# Patient Record
Sex: Male | Born: 1965 | Race: White | Hispanic: No | Marital: Single | Smoking: Former smoker
Health system: Southern US, Community
[De-identification: ages and names within clinical notes are randomized; demographics above are authoritative.]

## PROBLEM LIST (undated history)

## (undated) DIAGNOSIS — Z95810 Presence of automatic (implantable) cardiac defibrillator: Secondary | ICD-10-CM

## (undated) DIAGNOSIS — I509 Heart failure, unspecified: Secondary | ICD-10-CM

## (undated) DIAGNOSIS — Z8619 Personal history of other infectious and parasitic diseases: Secondary | ICD-10-CM

## (undated) DIAGNOSIS — R399 Unspecified symptoms and signs involving the genitourinary system: Secondary | ICD-10-CM

## (undated) DIAGNOSIS — R7303 Prediabetes: Secondary | ICD-10-CM

## (undated) DIAGNOSIS — K429 Umbilical hernia without obstruction or gangrene: Secondary | ICD-10-CM

## (undated) DIAGNOSIS — K759 Inflammatory liver disease, unspecified: Secondary | ICD-10-CM

## (undated) DIAGNOSIS — N529 Male erectile dysfunction, unspecified: Secondary | ICD-10-CM

## (undated) DIAGNOSIS — I517 Cardiomegaly: Secondary | ICD-10-CM

## (undated) DIAGNOSIS — M51369 Other intervertebral disc degeneration, lumbar region without mention of lumbar back pain or lower extremity pain: Secondary | ICD-10-CM

## (undated) DIAGNOSIS — C61 Malignant neoplasm of prostate: Secondary | ICD-10-CM

## (undated) DIAGNOSIS — K4090 Unilateral inguinal hernia, without obstruction or gangrene, not specified as recurrent: Secondary | ICD-10-CM

## (undated) DIAGNOSIS — M1990 Unspecified osteoarthritis, unspecified site: Secondary | ICD-10-CM

## (undated) DIAGNOSIS — K219 Gastro-esophageal reflux disease without esophagitis: Secondary | ICD-10-CM

## (undated) DIAGNOSIS — I428 Other cardiomyopathies: Secondary | ICD-10-CM

## (undated) DIAGNOSIS — F1911 Other psychoactive substance abuse, in remission: Secondary | ICD-10-CM

## (undated) DIAGNOSIS — I10 Essential (primary) hypertension: Secondary | ICD-10-CM

## (undated) DIAGNOSIS — E785 Hyperlipidemia, unspecified: Secondary | ICD-10-CM

## (undated) DIAGNOSIS — K649 Unspecified hemorrhoids: Secondary | ICD-10-CM

## (undated) DIAGNOSIS — K4020 Bilateral inguinal hernia, without obstruction or gangrene, not specified as recurrent: Secondary | ICD-10-CM

## (undated) DIAGNOSIS — I447 Left bundle-branch block, unspecified: Secondary | ICD-10-CM

## (undated) DIAGNOSIS — Q540 Hypospadias, balanic: Secondary | ICD-10-CM

## (undated) DIAGNOSIS — F1910 Other psychoactive substance abuse, uncomplicated: Secondary | ICD-10-CM

## (undated) DIAGNOSIS — J439 Emphysema, unspecified: Secondary | ICD-10-CM

## (undated) DIAGNOSIS — M5136 Other intervertebral disc degeneration, lumbar region: Secondary | ICD-10-CM

## (undated) DIAGNOSIS — Z950 Presence of cardiac pacemaker: Secondary | ICD-10-CM

## (undated) DIAGNOSIS — Z79899 Other long term (current) drug therapy: Secondary | ICD-10-CM

## (undated) HISTORY — DX: Other psychoactive substance abuse, uncomplicated: F19.10

## (undated) HISTORY — PX: HEMORRHOID SURGERY: SHX153

## (undated) HISTORY — DX: Heart failure, unspecified: I50.9

## (undated) HISTORY — PX: BIOPSY PROSTATE: PRO28

## (undated) HISTORY — DX: Emphysema, unspecified: J43.9

## (undated) HISTORY — PX: HYPOSPADIAS CORRECTION: SHX483

## (undated) HISTORY — PX: COLONOSCOPY W/ POLYPECTOMY: SHX1380

## (undated) HISTORY — PX: HERNIA REPAIR: SHX51

---

## 2019-11-14 ENCOUNTER — Ambulatory Visit: Payer: Self-pay

## 2019-11-25 ENCOUNTER — Ambulatory Visit: Payer: Self-pay

## 2019-11-25 ENCOUNTER — Ambulatory Visit: Payer: Self-pay | Attending: Internal Medicine

## 2019-11-25 DIAGNOSIS — Z23 Encounter for immunization: Secondary | ICD-10-CM | POA: Insufficient documentation

## 2019-11-25 NOTE — Progress Notes (Signed)
   Covid-19 Vaccination Clinic  Name:  Braelynn Scacco    MRN: [id number] DOB: Jul 18, 1966  11/25/2019  Mr. Heckman was observed post Covid-19 immunization for 15 minutes without incidence. He was provided with Vaccine Information Sheet and instruction to access the V-Safe system.   Mr. Saxon was instructed to call 911 with any severe reactions post vaccine: Marland Kitchen Difficulty breathing  . Swelling of your face and throat  . A fast heartbeat  . A bad rash all over your body  . Dizziness and weakness    Immunizations Administered    Name Date Dose VIS Date Route   Pfizer COVID-19 Vaccine 11/25/2019  9:12 AM 0.3 mL 09/24/2019 Intramuscular   Manufacturer: [place]   Lot: [id number]   [place]: [id number]

## 2019-12-18 ENCOUNTER — Ambulatory Visit: Payer: Self-pay | Attending: Internal Medicine

## 2019-12-18 DIAGNOSIS — Z23 Encounter for immunization: Secondary | ICD-10-CM

## 2019-12-18 NOTE — Progress Notes (Signed)
   Covid-19 Vaccination Clinic  Name:  Ruben Duffy    MRN: [id number] DOB: [birth year]  12/18/2019  Mr. [name] was observed post Covid-19 immunization for 15 minutes without incident. He was provided with Vaccine Information Sheet and instruction to access the V-Safe system.   Mr. [name] was instructed to call 911 with any severe reactions post vaccine: Marland Kitchen Difficulty breathing  . Swelling of face and throat  . A fast heartbeat  . A bad rash all over body  . Dizziness and weakness   Immunizations Administered    Name Date Dose VIS Date Route   Pfizer COVID-19 Vaccine 12/18/2019  8:54 AM 0.3 mL 09/24/2019 Intramuscular   Manufacturer: [place]   Lot: [id number]   [place]: [id number]

## 2021-01-08 DIAGNOSIS — C61 Malignant neoplasm of prostate: Secondary | ICD-10-CM

## 2021-01-08 HISTORY — DX: Malignant neoplasm of prostate: C61

## 2021-02-23 ENCOUNTER — Ambulatory Visit
Admission: RE | Admit: 2021-02-23 | Discharge: 2021-02-23 | Disposition: A | Discharge status: Home / Self Care | Payer: 59 | Source: Ambulatory Visit | Attending: Radiation Oncology | Admitting: Radiation Oncology

## 2021-02-23 ENCOUNTER — Encounter: Payer: Self-pay | Admitting: Radiation Oncology

## 2021-02-23 ENCOUNTER — Other Ambulatory Visit: Payer: Self-pay

## 2021-02-23 VITALS — BP 112/68 | HR 75 | Temp 98.1°F | Resp 18 | Ht 68.0 in | Wt 190.4 lb

## 2021-02-23 DIAGNOSIS — Z79899 Other long term (current) drug therapy: Secondary | ICD-10-CM | POA: Insufficient documentation

## 2021-02-23 DIAGNOSIS — Z87891 Personal history of nicotine dependence: Secondary | ICD-10-CM | POA: Diagnosis not present

## 2021-02-23 DIAGNOSIS — Z791 Long term (current) use of non-steroidal anti-inflammatories (NSAID): Secondary | ICD-10-CM | POA: Insufficient documentation

## 2021-02-23 DIAGNOSIS — C61 Malignant neoplasm of prostate: Secondary | ICD-10-CM | POA: Insufficient documentation

## 2021-02-23 DIAGNOSIS — Z808 Family history of malignant neoplasm of other organs or systems: Secondary | ICD-10-CM | POA: Diagnosis not present

## 2021-02-23 DIAGNOSIS — Z801 Family history of malignant neoplasm of trachea, bronchus and lung: Secondary | ICD-10-CM | POA: Insufficient documentation

## 2021-02-23 HISTORY — DX: Malignant neoplasm of prostate: C61

## 2021-02-23 NOTE — Progress Notes (Signed)
Radiation Oncology         [phone]) [phone] ________________________________  Initial outpatient Consultation  Name: Ruben Duffy MRN: [id number]  Date: 02/23/2021  DOB: [birth year]  CC:Pcp, No  Irine Seal, MD   REFERRING PHYSICIAN: Irine Seal, MD  DIAGNOSIS: 55 y.o. 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    ICD-10-CM   1. Malignant neoplasm of prostate ([place])  C61     HISTORY OF PRESENT ILLNESS: Ruben Duffy is a 55 y.o. male with a diagnosis of prostate cancer. He was initially referred to Dr. Jeffie Pollock on 11/03/20 with complaints of recurrent balanitis and redundant foreskin, for consideration of adult circumcision. Digital rectal examination was performed at that time revealing right lobe firmness at the base. PSA from that day was 1.57.  In light of the abnormal DRE and family history of prostate cancer, the patient proceeded to transrectal ultrasound with 12 biopsies of the prostate on 01/08/21.  The prostate volume measured 35.64 cc.  Out of 13 core biopsies, 2 were positive.  The maximum Gleason score was 3+4, and this was seen in the left apex lateral. Additionally, a small focus of Gleason 3+3 was seen in the left apex.  The patient reviewed the biopsy results with his urologist and he has kindly been referred today for discussion of potential radiation treatment options.   PREVIOUS RADIATION THERAPY: No  PAST MEDICAL HISTORY:  Past Medical History:  Diagnosis Date  . Prostate cancer ([place])       PAST SURGICAL HISTORY:History reviewed. No pertinent surgical history.  FAMILY HISTORY:  Family History  Problem Relation Age of Onset  . Lung cancer Maternal Aunt   . Brain cancer Maternal Uncle   . Cancer Paternal Grandmother   . Lung cancer Maternal Uncle   . Lung cancer Maternal Uncle     SOCIAL HISTORY:  Social History   Socioeconomic History  . Marital status: Single    Spouse name: Not on file  . Number of  children: 1  . Years of education: Not on file  . Highest education level: Not on file  Occupational History  . 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  Tobacco Use  . Smoking status: Former Smoker    Packs/day: 1.50    Years: 30.00    Pack years: 45.00    Types: Cigarettes    Quit date: 10/15/2007    Years since quitting: 13.3  . Smokeless tobacco: Never Used  Vaping Use  . Vaping Use: Never used  Substance and Sexual Activity  . Alcohol use: Not Currently  . Drug use: Not Currently    Types: Marijuana, "Crack" cocaine  . Sexual activity: Yes  Other Topics Concern  . Not on file  Social History Narrative  . Not on file   Social Determinants of Health   Financial Resource Strain: Not on file  Food Insecurity: Not on file  Transportation Needs: Not on file  Physical Activity: Not on file  Stress: Not on file  Social Connections: Not on file  Intimate Partner Violence: Not on file    ALLERGIES: Patient has no known allergies.  MEDICATIONS:  Current Outpatient Medications  Medication Sig Dispense Refill  . Ascorbic Acid (VITAMIN C) 1000 MG tablet Take 1,000 mg by mouth daily.    . Cholecalciferol (VITAMIN D3) 1.25 MG (50000 UT) CAPS Take by mouth.    . DULoxetine (CYMBALTA) 60 MG capsule Take 60 mg  by mouth daily.    Marland Kitchen lisinopril-hydrochlorothiazide (ZESTORETIC) 20-12.5 MG tablet Take 1 tablet by mouth daily.    . meloxicam (MOBIC) 15 MG tablet Take 1 tablet by mouth daily.    . Multiple Vitamin (MULTIVITAMIN ADULT PO) Take by mouth.    Marland Kitchen omeprazole (PRILOSEC) 40 MG capsule Take 1 capsule by mouth daily.    . sildenafil (REVATIO) 20 MG tablet SMARTSIG:2 Tablet(s) By Mouth PRN    . triamcinolone (KENALOG) 0.025 % ointment Apply topically 2 (two) times daily.    . valACYclovir (VALTREX) 500 MG tablet Take 500 mg by mouth 2 (two) times daily as needed.    . vitamin B-12 (CYANOCOBALAMIN) 1000 MCG tablet Take 1,000 mcg by mouth daily.     No current  facility-administered medications for this encounter.    REVIEW OF SYSTEMS:  On review of systems, the patient reports that he is doing well overall. He denies any chest pain, shortness of breath, cough, fevers, chills, night sweats, unintended weight changes. He denies any bowel disturbances, and denies abdominal pain, nausea or vomiting. He denies any new musculoskeletal or joint aches or pains. His IPSS was 17, indicating moderate urinary symptoms, which are mostly irritative with urgency and frequency as well as nocturia x2 per night. He reports an abnormal sensation at the start of urination and small volume urinary leakage associated with urgency. His SHIM was 12, indicating he has moderate erectile dysfunction. A complete review of systems is obtained and is otherwise negative.    PHYSICAL EXAM:  Wt Readings from Last 3 Encounters:  02/23/21 190 lb 6.4 oz (86.4 kg)   Temp Readings from Last 3 Encounters:  02/23/21 98.1 F (36.7 C)   BP Readings from Last 3 Encounters:  02/23/21 112/68   Pulse Readings from Last 3 Encounters:  02/23/21 75   Pain Assessment Pain Score: 0-No pain/10  In general this is a well appearing Caucasian male in no acute distress. He's alert and oriented x4 and appropriate throughout the examination. Cardiopulmonary assessment is negative for acute distress, and he exhibits normal effort.     KPS = 100  100 - Normal; no complaints; no evidence of disease. 90   - Able to carry on normal activity; minor signs or symptoms of disease. 80   - Normal activity with effort; some signs or symptoms of disease. 70   - Cares for self; unable to carry on normal activity or to do active work. 60   - Requires occasional assistance, but is able to care for most of his personal needs. 50   - Requires considerable assistance and frequent medical care. 71   - Disabled; requires special care and assistance. 14   - Severely disabled; hospital admission is indicated although  death not imminent. 4   - Very sick; hospital admission necessary; active supportive treatment necessary. 10   - Moribund; fatal processes progressing rapidly. 0     - Dead  Karnofsky DA, Abelmann WH, Craver LS and Burchenal JH [phone]) The use of the nitrogen mustards in the palliative treatment of carcinoma: with particular reference to bronchogenic carcinoma Cancer 1 634-56  LABORATORY DATA:  No results found for: WBC, HGB, HCT, MCV, PLT No results found for: NA, K, CL, CO2 No results found for: ALT, AST, GGT, ALKPHOS, BILITOT   RADIOGRAPHY: No results found.    IMPRESSION/PLAN: 1. 55 y.o. gentleman with Stage T2a adenocarcinoma of the prostate with Gleason Score of 3+4, and PSA of 1.57. We discussed the  patient's workup and outlined the nature of prostate cancer in this setting. The patient's T stage, Gleason's score, and PSA put him into the favorable intermediate risk group. Accordingly, he is eligible for a variety of potential treatment options including brachytherapy, 5.5 weeks of external radiation, or prostatectomy. We discussed the available radiation techniques, and focused on the details and logistics of delivery. We discussed and outlined the risks, benefits, short and long-term effects associated with radiotherapy and compared and contrasted these with prostatectomy. We discussed the role of SpaceOAR gel in reducing the rectal toxicity associated with radiotherapy. He appears to have a good understanding of his disease and our treatment recommendations which are of curative intent.  He was encouraged to ask questions that were answered to his stated satisfaction.  At the conclusion of our conversation, the patient is interested in moving forward with brachytherapy and use of SpaceOAR gel to reduce rectal toxicity from radiotherapy.  We will share our discussion with Dr. Jeffie Pollock and move forward with scheduling his [facility] planning appointment in the near future.  The patient met briefly  with Romie Jumper in our office who will be working closely with him to coordinate OR scheduling and pre and post procedure appointments.  We will contact the pharmaceutical rep to ensure that [place] is available at the time of procedure.  We enjoyed meeting him today and look forward to continuing to participate in his care.   We personally spent 80 minutes in this encounter including chart review, reviewing radiological studies, meeting face-to-face with the patient, entering orders and completing documentation.    Nicholos Johns, PA-C    Tyler Pita, MD  [facility] Direct Dial: [phone]  Fax: [phone] .com  Skype  LinkedIn   This document serves as a record of services personally performed by Tyler Pita, MD and Freeman Caldron, PA-C. It was created on their behalf by [name], a trained medical scribe. The creation of this record is based on the scribe's personal observations and the provider's statements to them. This document has been checked and approved by the attending provider.

## 2021-02-23 NOTE — Progress Notes (Signed)
GU Location of Tumor / Histology: prostatic adenocarcinoma  If Prostate Cancer, Gleason Score is (3 + 4) and PSA is (1.57). Prostate volume: 35.64 cc  Oaklee Oliveri was initially referred to Dr. Jeffie Pollock on 11/03/20 with irritation and discomfort of the head of his penis. Digital rectal examination was performed at that time revealing right lobe firmness at the base. PSA from that day was 1.57. The patient proceeded to transrectal ultrasound with 12 biopsies of the prostate on 01/08/21.    Biopsies of prostate (if applicable) revealed:    Past/Anticipated interventions by urology, if any: prostate biopsy, referral to Dr. Tammi Klippel to discuss radiation therapy options (patient most interested in seeds)  No imaging ordered by urology  Past/Anticipated interventions by medical oncology, if any: no  Weight changes, if any: denies  Bowel/Bladder complaints, if any: IPSS 17. SHIM 12. Denies hematuria or urinary incontinence. Reports an abnormal sensation at the start of urination.  Reports urinary leakage associated with urgency. Denies bowel complaints.  Nausea/Vomiting, if any: denies  Pain issues, if any:  Reports low back and right shoulder pain.  SAFETY ISSUES:  Prior radiation? denies  Pacemaker/ICD? denies  Possible current pregnancy? no, male patient  Is the patient on methotrexate? no  Current Complaints / other details:  55 year old male. Single with daughter. Resides in [place], [place]. Freight forwarder with Production designer, theatre/television/film.

## 2021-02-27 ENCOUNTER — Telehealth: Payer: Self-pay | Admitting: *Deleted

## 2021-02-27 NOTE — Telephone Encounter (Signed)
CALLED PATIENT TO UPDATE, SPOKE WITH PATIENT 

## 2021-03-01 ENCOUNTER — Other Ambulatory Visit: Payer: Self-pay | Admitting: Urology

## 2021-03-01 ENCOUNTER — Telehealth: Payer: Self-pay | Admitting: *Deleted

## 2021-03-01 DIAGNOSIS — C61 Malignant neoplasm of prostate: Secondary | ICD-10-CM

## 2021-03-01 NOTE — Telephone Encounter (Signed)
Called patient to inform of pre-seed appts. and implant date, spoke with patient and he is aware of these appts. 

## 2021-04-25 ENCOUNTER — Telehealth: Payer: Self-pay | Admitting: *Deleted

## 2021-04-25 NOTE — Progress Notes (Signed)
  Radiation Oncology         [phone]) [phone] ________________________________  Name: Ruben Duffy MRN: [id number]  Date: 04/26/2021  DOB: 11/01/1965  SIMULATION AND TREATMENT PLANNING NOTE PUBIC ARCH STUDY  CC:Pcp, No  Irine Seal, MD  DIAGNOSIS: 55 y.o. 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 Oncology History  Malignant neoplasm of prostate ([place])  01/08/2021 Cancer Staging   Staging form: Prostate, AJCC 8th Edition - Clinical stage from 01/08/2021: Stage IIB (cT1c, cN0, cM0, PSA: 1.6, Grade Group: 2) - Signed by Freeman Caldron, PA-C on 02/23/2021  Histopathologic type: Adenocarcinoma, NOS  Stage prefix: Initial diagnosis  Prostate specific antigen (PSA) range: Less than 10  Gleason primary pattern: 3  Gleason secondary pattern: 4  Gleason score: 7  Histologic grading system: 5 grade system  Number of biopsy cores examined: 12  Number of biopsy cores positive: 2  Location of positive needle core biopsies: One side    02/23/2021 Initial Diagnosis   Malignant neoplasm of prostate ([place])        ICD-10-CM   1. Malignant neoplasm of prostate ([place])  C61       COMPLEX SIMULATION:  The patient presented today for evaluation for possible prostate seed implant. He was brought to the radiation planning suite and placed supine on the CT couch. A 3-dimensional image study set was obtained in upload to the planning computer. There, on each axial slice, I contoured the prostate gland. Then, using three-dimensional radiation planning tools I reconstructed the prostate in view of the structures from the transperineal needle pathway to assess for possible pubic arch interference. In doing so, I did not appreciate any pubic arch interference. Also, the patient's prostate volume was estimated based on the drawn structure. The volume was 34 cc.  Given the pubic arch appearance and prostate volume, patient remains a good candidate to  proceed with prostate seed implant. Today, he freely provided informed written consent to proceed.    PLAN: The patient will undergo prostate seed implant.   ________________________________  Sheral Apley. Tammi Klippel, M.D.

## 2021-04-25 NOTE — Telephone Encounter (Signed)
CALLED PATIENT TO REMIND OF PRE-SEED APPTS. FOR 04-26-21, SPOKE WITH MOM- MARIE EDWARDS AND SHE IS AWARE OF THESE APPTS.

## 2021-04-26 ENCOUNTER — Encounter: Payer: Self-pay | Admitting: Urology

## 2021-04-26 ENCOUNTER — Other Ambulatory Visit: Payer: Self-pay

## 2021-04-26 ENCOUNTER — Ambulatory Visit
Admission: RE | Admit: 2021-04-26 | Discharge: 2021-04-26 | Disposition: A | Discharge status: Home / Self Care | Payer: 59 | Source: Ambulatory Visit | Attending: Radiation Oncology | Admitting: Radiation Oncology

## 2021-04-26 ENCOUNTER — Ambulatory Visit (HOSPITAL_COMMUNITY)
Admission: RE | Admit: 2021-04-26 | Discharge: 2021-04-26 | Disposition: A | Discharge status: Home / Self Care | Payer: 59 | Source: Ambulatory Visit | Attending: Urology | Admitting: Urology

## 2021-04-26 ENCOUNTER — Telehealth: Payer: Self-pay | Admitting: *Deleted

## 2021-04-26 ENCOUNTER — Encounter (HOSPITAL_COMMUNITY)
Admission: RE | Admit: 2021-04-26 | Discharge: 2021-04-26 | Disposition: A | Discharge status: Home / Self Care | Payer: 59 | Source: Ambulatory Visit | Attending: Urology | Admitting: Urology

## 2021-04-26 ENCOUNTER — Other Ambulatory Visit: Payer: Self-pay | Admitting: Urology

## 2021-04-26 ENCOUNTER — Ambulatory Visit
Admission: RE | Admit: 2021-04-26 | Discharge: 2021-04-26 | Disposition: A | Discharge status: Home / Self Care | Payer: 59 | Source: Ambulatory Visit | Attending: Urology | Admitting: Urology

## 2021-04-26 DIAGNOSIS — C61 Malignant neoplasm of prostate: Secondary | ICD-10-CM

## 2021-04-26 DIAGNOSIS — I447 Left bundle-branch block, unspecified: Secondary | ICD-10-CM

## 2021-04-26 HISTORY — DX: Left bundle-branch block, unspecified: I44.7

## 2021-04-26 MED ORDER — TAMSULOSIN HCL 0.4 MG PO CAPS: 0.4000 mg | ORAL_CAPSULE | Freq: Every day | ORAL | 3 refills | Status: DC

## 2021-04-26 NOTE — Progress Notes (Signed)
Patient in for pre seed appointment is doing well. States he has an occasional sharp pain in his left hip area. 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 He has noticed more urgency with urination.

## 2021-04-26 NOTE — Telephone Encounter (Signed)
Connected with Ruben Duffy  [phone]).   Received ROI, license and cover sheet without form.   "Registration number [email or web address] gave the form back to me.  Form is there.  Can you go by to get it and I will pick up tomorrow?  I left two bags of supplement medications at [place] discovered when I returned home in [place]."  Returned call to radiation staff regarding message left regarding patient spoke with this nurse who returned form but wanted to know if copy was made.  None of forms nurses spoke with patient.

## 2021-04-27 NOTE — Progress Notes (Addendum)
Spoke with pt by phone no previous ekg is available, pt stated no cardiac s & s at phone call.

## 2021-04-27 NOTE — Telephone Encounter (Signed)
Message left to inform Ruben Duffy [phone]) this nurse retrieved medications and forms from [place] last evening.  Medications, appointment and procedural instruction papers remain in bags currently in nursing AD office "lost and found" if not yet retrieved.  This nurse has FMLA request in queue to process as requested yesterday "to begin 06/05/2021 through date per provider".

## 2021-04-30 NOTE — Progress Notes (Signed)
Spoke with Ruben Duffy and made aware pt with left bundle branch block on 04-26-21 ekg and needs cardiology clearance for 06-05-21 surgery

## 2021-05-09 ENCOUNTER — Other Ambulatory Visit: Payer: Self-pay

## 2021-05-09 ENCOUNTER — Ambulatory Visit (INDEPENDENT_AMBULATORY_CARE_PROVIDER_SITE_OTHER): Payer: 59 | Admitting: Cardiovascular Disease

## 2021-05-09 ENCOUNTER — Encounter: Payer: Self-pay | Admitting: Cardiovascular Disease

## 2021-05-09 DIAGNOSIS — Z01810 Encounter for preprocedural cardiovascular examination: Secondary | ICD-10-CM | POA: Diagnosis not present

## 2021-05-09 DIAGNOSIS — I447 Left bundle-branch block, unspecified: Secondary | ICD-10-CM | POA: Diagnosis not present

## 2021-05-09 DIAGNOSIS — I10 Essential (primary) hypertension: Secondary | ICD-10-CM | POA: Diagnosis not present

## 2021-05-09 DIAGNOSIS — Z01818 Encounter for other preprocedural examination: Secondary | ICD-10-CM | POA: Insufficient documentation

## 2021-05-09 NOTE — Assessment & Plan Note (Signed)
Unknown duration 

## 2021-05-09 NOTE — Patient Instructions (Signed)

## 2021-05-09 NOTE — Assessment & Plan Note (Signed)
Patient was referred for preoperative clearance before radioactive seed implant and circumcision.  I suspect this will be an outpatient procedure and is low risk.  Patient is completely asymptomatic with risk factors that include treated hypertension.  He does have a bundle branch block.  He is low risk for his upcoming procedure.  I will see him back as needed

## 2021-05-09 NOTE — Assessment & Plan Note (Signed)
History of essential hypertension blood pressure measured today at 110/68.  He is on lisinopril/hydrochlorothiazide.

## 2021-05-09 NOTE — Progress Notes (Signed)
05/09/2021 Ruben Duffy   08/28/1966  [id number]  Primary Physician Bonnita Hollow, MD Primary Cardiologist: Lorretta Harp MD Renae Gloss  HPI:  Ruben Duffy is a 55 y.o. fit appearing divorced Caucasian male father of 77 daughter, grandfather of 3 grandchildren who currently works as a Public house manager.  He was referred for preoperative clearance prior to elective radioactive seed implant for prostate cancer and simultaneous circumcision by Dr. Sandrea Matte scheduled for later next month as an outpatient.  His risk factors include discontinued tobacco abuse having stopped September 13, 2008.  History of hypertension.  There is no family history for heart disease.  He is never had a heart tach or stroke.  He denies chest pain or shortness of breath.  He does have left bundle branch block of unknown duration.  He is very active and denies symptoms of chest pain or shortness of breath.   Current Meds  Medication Sig   Ascorbic Acid (VITAMIN C) 1000 MG tablet Take 1,000 mg by mouth daily.   Cholecalciferol (VITAMIN D3) 1.25 MG (50000 UT) CAPS Take by mouth.   DULoxetine (CYMBALTA) 60 MG capsule Take 60 mg by mouth daily.   lisinopril-hydrochlorothiazide (ZESTORETIC) 20-12.5 MG tablet Take 1 tablet by mouth daily.   meloxicam (MOBIC) 15 MG tablet Take 1 tablet by mouth daily.   Multiple Vitamin (MULTIVITAMIN ADULT PO) Take by mouth.   omeprazole (PRILOSEC) 40 MG capsule Take 1 capsule by mouth daily.   sildenafil (REVATIO) 20 MG tablet    tamsulosin (FLOMAX) 0.4 MG CAPS capsule Take 1 capsule (0.4 mg total) by mouth daily after supper.   valACYclovir (VALTREX) 500 MG tablet Take 500 mg by mouth 2 (two) times daily as needed.   vitamin B-12 (CYANOCOBALAMIN) 1000 MCG tablet Take 1,000 mcg by mouth daily.     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  Tobacco Use   Smoking status: Former    Packs/day: 1.50    Years: 30.00    Pack years: 45.00    Types: Cigarettes    Quit date: 10/15/2007    Years since quitting: 13.5   Smokeless tobacco: Never  Vaping Use   Vaping Use: Never used  Substance and Sexual Activity   Alcohol use: Not Currently   Drug use: Not Currently    Types: Marijuana, "Crack" cocaine   Sexual activity: Yes  Other Topics Concern   Not on file  Social History Narrative   Not on file   Social Determinants of Health   Financial Resource Strain: Not on file  Food Insecurity: Not on file  Transportation Needs: Not on file  Physical Activity: Not on file  Stress: Not on file  Social Connections: Not on file  Intimate Partner Violence: Not on file     Review of Systems: General: negative for chills, fever, night sweats or weight changes.  Cardiovascular: negative for chest pain, dyspnea on exertion, edema, orthopnea, palpitations, paroxysmal nocturnal dyspnea or shortness of breath Dermatological: negative for rash Respiratory: negative for cough or wheezing Urologic: negative for hematuria Abdominal: negative for nausea, vomiting, diarrhea, bright red blood per rectum, melena, or hematemesis Neurologic:  negative for visual changes, syncope, or dizziness All other systems reviewed and are otherwise negative except as noted above.    Blood pressure 110/68, pulse 79, height '5\' 8"'$  (1.727 m), weight 191 lb 9.6 oz (86.9 kg), SpO2 98 %.  General appearance: alert and no distress Neck: no adenopathy, no carotid bruit, no JVD, supple, symmetrical, trachea midline, and thyroid not enlarged, symmetric, no tenderness/mass/nodules Lungs: clear to auscultation bilaterally Heart: regular rate and rhythm, S1, S2 normal, no murmur, click, rub or gallop Extremities:  extremities normal, atraumatic, no cyanosis or edema Pulses: 2+ and symmetric Skin: Skin color, texture, turgor normal. No rashes or lesions Neurologic: Grossly normal  EKG not performed today  ASSESSMENT AND PLAN:   Left bundle branch block Unknown duration  Essential hypertension History of essential hypertension blood pressure measured today at 110/68.  He is on lisinopril/hydrochlorothiazide.  Preoperative clearance Patient was referred for preoperative clearance before radioactive seed implant and circumcision.  I suspect this will be an outpatient procedure and is low risk.  Patient is completely asymptomatic with risk factors that include treated hypertension.  He does have a bundle branch block.  He is low risk for his upcoming procedure.  I will see him back as needed     Lorretta Harp MD [facility], [facility] 05/09/2021 8:28 AM

## 2021-06-01 ENCOUNTER — Encounter (HOSPITAL_BASED_OUTPATIENT_CLINIC_OR_DEPARTMENT_OTHER): Payer: Self-pay | Admitting: Urology

## 2021-06-01 ENCOUNTER — Other Ambulatory Visit: Payer: Self-pay

## 2021-06-01 ENCOUNTER — Encounter (HOSPITAL_COMMUNITY)
Admission: RE | Admit: 2021-06-01 | Discharge: 2021-06-01 | Disposition: A | Discharge status: Home / Self Care | Payer: 59 | Source: Ambulatory Visit | Attending: Urology | Admitting: Urology

## 2021-06-01 DIAGNOSIS — Z01812 Encounter for preprocedural laboratory examination: Secondary | ICD-10-CM | POA: Insufficient documentation

## 2021-06-01 LAB — COMPREHENSIVE METABOLIC PANEL
ALT: 13 U/L (ref 0–44)
AST: 18 U/L (ref 15–41)
Albumin: 4.1 g/dL (ref 3.5–5.0)
Alkaline Phosphatase: 42 U/L (ref 38–126)
Anion gap: 5 (ref 5–15)
BUN: 11 mg/dL (ref 6–20)
CO2: 28 mmol/L (ref 22–32)
Calcium: 9.2 mg/dL (ref 8.9–10.3)
Chloride: 104 mmol/L (ref 98–111)
Creatinine, Ser: 0.97 mg/dL (ref 0.61–1.24)
GFR, Estimated: >60 mL/min (ref >60)
Glucose, Bld: 116 mg/dL — ABNORMAL HIGH (ref 70–99)
Potassium: 4.5 mmol/L (ref 3.5–5.1)
Sodium: 137 mmol/L (ref 135–145)
Total Bilirubin: 0.8 mg/dL (ref 0.3–1.2)
Total Protein: 6.8 g/dL (ref 6.5–8.1)

## 2021-06-01 LAB — CBC
HCT: 44.1 % (ref 39.0–52.0)
Hemoglobin: 13.8 g/dL (ref 13.0–17.0)
MCH: 27.1 pg (ref 26.0–34.0)
MCHC: 31.3 g/dL (ref 30.0–36.0)
MCV: 86.5 fL (ref 80.0–100.0)
Platelets: 285 10*3/uL (ref 150–400)
RBC: 5.1 MIL/uL (ref 4.22–5.81)
RDW: 13.9 % (ref 11.5–15.5)
WBC: 6.3 10*3/uL (ref 4.0–10.5)
nRBC: 0 % (ref 0.0–0.2)

## 2021-06-01 LAB — PROTIME-INR
INR: 1.1 (ref 0.8–1.2)
Prothrombin Time: 13.8 seconds (ref 11.4–15.2)

## 2021-06-01 LAB — APTT: aPTT: 33 seconds (ref 24–36)

## 2021-06-04 ENCOUNTER — Other Ambulatory Visit: Payer: Self-pay

## 2021-06-04 ENCOUNTER — Telehealth: Payer: Self-pay | Admitting: *Deleted

## 2021-06-04 ENCOUNTER — Encounter (HOSPITAL_BASED_OUTPATIENT_CLINIC_OR_DEPARTMENT_OTHER): Payer: Self-pay | Admitting: Urology

## 2021-06-04 NOTE — Progress Notes (Signed)
Spoke w/ via phone for pre-op interview--- [facility] Lab needs dos---- NONE              Lab results------ see chart COVID test -----patient states asymptomatic no test needed Arrive at ------- 0530 NPO after MN NO Solid Food.  Clear liquids from MN until--- 0430 Med rec completed Medications to take morning of surgery ----- none Diabetic medication ----- none Patient instructed no nail polish to be worn day of surgery Patient instructed to bring photo id and insurance card day of surgery Patient aware to have Driver (ride ) / caregiver    for 24 hours after surgery  Patient Special Instructions ----- Fleets enema AM of surgery Pre-Op special Istructions ----- Patient verbalized understanding of instructions that were given at this phone interview. Patient denies shortness of breath, chest pain, fever, cough at this phone interview. Anesthesia Review:  PCP: Dr. Josephine Igo Cardiologist : Dr. Gwenlyn Found. Cardiac clearance in office note from 05/09/21, copy in chart. Chest x-ray : 714/22 EKG : 04/26/21 Echo : Stress test: Cardiac Cath :  Activity level:  Sleep Study/ CPAP : Fasting Blood Sugar :      / Checks Blood Sugar -- times a day:   Blood Thinner/ Instructions /Last Dose: ASA / Instructions/ Last Dose :

## 2021-06-04 NOTE — H&P (Signed)
Pt presents today for pre-operative history and physical exam in anticipation of radioactive seed and space oar placement by Dr. Jeffie Pollock on 06/05/21. He is doing well and is without complaint.   Pt denies F/C, HA, CP, SOB, N/V, diarrhea/constipation, back pain, flank pain, hematuria, and dysuria.    HX:     CC: I have prostate cancer.  HPI: Ruben Duffy is a 55 year-old male established patient who is here evaluation for treatment of prostate cancer.  Ruben Duffy returns today to discuss his prostate biopsy results. He was found to have a 58m prostate with 2 cores positive for cancer. He has GG2 in 30% of the left lateral apical biopsy and GG1 5% in the left medial apical core. His PSA was 1.57 and he had induration at the left base of the prostate. He has seen Dr. PClaudia Desanctisabout the hypospadius and it was felt that a tertiary care referral was indicated since he had prior treatment. He has IPSS of 25 and his SHIM is 1 but he has function for masterbation.      AUA Symptom Score: More than 50% of the time he has the sensation of not emptying his bladder completely when finished urinating. Almost always he has to urinate again fewer than two hours after he has finished urinating. 50% of the time he has to start and stop again several times when he urinates. Almost always he finds it difficult to postpone urination. 50% of the time he has a weak urinary stream. Less than 50% of the time he has to push or strain to begin urination. He has to get up to urinate 3 times from the time he goes to bed until the time he gets up in the morning.   Calculated AUA Symptom Score: 25    ALLERGIES: No Known Drug Allergies    MEDICATIONS: Aspirin 81 mg tablet,chewable  Omeprazole 40 mg capsule,delayed release  Duloxetine Hcl 60 mg capsule,delayed release  Levofloxacin 750 mg tablet 1 po 1 hour prior to the procedure  Lisinopril-Hydrochlorothiazide  Meloxicam 7.5 mg tablet  Sildenafil Citrate 20 mg tablet 1-5 tab po  prn     GU PSH: Prostate Needle Biopsy - 01/08/2021       PSH Notes: Kidney surgery (L) ?[facility]    NON-GU PSH: Hemorrhoidectomy, approx 2010 Surgical Pathology, Gross And Microscopic Examination For Prostate Needle - 01/08/2021     GU PMH: Hypospadias, balanic, He has decided that he wants to forgo hypospadius reconstruction and proceed with a circumcision which we will do at the time of his seed implant. I have reviewed the added risks of bleeding, infection, penile pain and scarring, need for secondary procedures, thrombotic events and anesthetic complications. - 04/13/2021, He will need a referral to a reconstructive urologist for management of the hypospadius but I recommended holding off until we have dealt with the prostate cancer. , - 01/31/2021, After meeting patient and completing physical exam along with patient's history of a failed prior hypospadias repair I think patient would best be served with a reconstructive urologist. I had initially been open to attempting repair however given the location which is sub coronal and prior surgery in the area I think this is set up for higher rate of failure or fistula. I have called the patient leave a message to discuss referral to reconstructive urologist., - 01/19/2021, He has a coronal/glanular hypospadius with bothersome redundant foreskin. I have suggested he consider repair of the hypospadius since he has  some stream issues at least before making a final decision on a circumcision. I suggested a tertiary care referral but he would prefer to stay local so I will see if any of my partners are interested in seeing him. , - 11/03/2020 Prostate Cancer, He will proceed with the seed implant as planned. - 04/13/2021, He has T2a Nx Mx low volume gleason 7(3+4) prostate cancer with severe LUTS and ED. I discussed options for therapy with him including AS, RALP, EXRT and seeds with spaceOAR, Cryo and HIFU. He is most interested in  a seed implant and was made aware of the risks associated with the severe LUTS and also the impact on ED, but he would still like to consider that. I will get him set up for consultation by Dr. Tammi Klippel. , - 01/31/2021, Patient also with prostate cancer on recent prostate biopsy. He has an appoint with Dr. Jeffie Pollock scheduled for next week. I did give him a copy of his pathology report, localized prostate cancer patient guide and [facility] prostate cancer nomogram. I briefly discussed with patient risk stratification and stage. Patient will discuss overall treatment options with Dr. Jeffie Pollock. , - 01/19/2021 ED due to arterial insufficiency, He has a low SHIM but is not active with a partner but does report a sufficient erection for masturbation. He was interested in medical therapy so I gave him a script for sildenafil with a review of the side effects and instructions. - 01/31/2021 Prostate nodule w/ LUTS, He has an IPSS of 25 with a 7m prostate. He states the OAB symptoms are quite chronic and not too bothersome. - 01/31/2021, He has a small prostate with a nodule and moderate LUTS. I am going to get a PSA and have him return for a prostate [place] biopsy. I have reviewed the risks of bleeding, infection and voiding difficulty. , - 11/03/2020 Urinary Urgency - 01/31/2021, - 11/03/2020 Prostate nodule w/o LUTS - 01/08/2021 Incomplete bladder emptying, He has a PVR of 1050m - 11/03/2020 Nocturia - 11/03/2020 Weak Urinary Stream - 11/03/2020    NON-GU PMH: Anxiety Arthritis GERD Hepatitis C Hypertension Inflammatory liver disease, unspecified    FAMILY HISTORY: 1 Daughter - Other Cancer - Runs in Family Hypertension - Runs in Family Myocardial Infarction - Uncle nephrolithiasis - Brother stroke - Father    Notes: 1 maternal uncle with CaP s/p surgery   SOCIAL HISTORY: Marital Status: Single Current Smoking Status: Patient does not smoke anymore. Has not smoked since 10/03/2008. Smoked 1 pack per day.   Tobacco  Use Assessment Completed: Used Tobacco in last 30 days? Does not use smokeless tobacco. Has not drank since 10/14/2000.  Does not use drugs. Drinks 2 caffeinated drinks per day. Has not had a blood transfusion. 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 Patient reports frequent urination, hard to postpone urination, and leakage of urine. Patient denies burning/ pain with urination, get up at night to urinate, stream starts and stops, trouble starting your stream, have to strain to urinate , erection problems, and penile pain.  Gastrointestinal (Upper):   Patient denies nausea, vomiting, and indigestion/ heartburn.  Gastrointestinal (Lower):   Patient denies diarrhea and constipation.  Constitutional:   Patient denies fever, night sweats, weight loss, and fatigue.  Skin:   Patient denies itching and skin rash/ lesion.  Eyes:   Patient denies blurred vision and double vision.  Ears/ Nose/ Throat:  Patient denies sore throat and sinus problems.  Hematologic/Lymphatic:   Patient denies swollen glands and easy bruising.  Cardiovascular:   Patient denies leg swelling and chest pains.  Respiratory:   Patient denies cough and shortness of breath.  Endocrine:   Patient denies excessive thirst.  Musculoskeletal:   Patient denies back pain and joint pain.  Neurological:   Patient denies headaches and dizziness.  Psychologic:   Patient denies depression and anxiety.   VITAL SIGNS:      05/22/2021 02:16 PM  Weight 185 lb / 83.91 kg  Height 68 in / 172.72 cm  BP 124/78 mmHg  Pulse 88 /min  Temperature 98.0 F / 36.6 C  BMI 28.1 kg/m   MULTI-SYSTEM PHYSICAL EXAMINATION:    Constitutional: Well-nourished. No physical deformities. Normally developed. Good grooming.  Neck: Neck symmetrical, not swollen. Normal tracheal position.  Respiratory: Normal breath sounds. No labored breathing, no use of accessory  muscles.   Cardiovascular: Regular rate and rhythm. No murmur, no gallop.   Lymphatic: No enlargement of neck, axillae, groin.  Skin: No paleness, no jaundice, no cyanosis. No lesion, no ulcer, no rash.  Neurologic / Psychiatric: Oriented to time, oriented to place, oriented to person. No depression, no anxiety, no agitation.  Gastrointestinal: No mass, no tenderness, no rigidity, non obese abdomen.  Eyes: Normal conjunctivae. Normal eyelids.  Ears, Nose, Mouth, and Throat: Left ear no scars, no lesions, no masses. Right ear no scars, no lesions, no masses. Nose no scars, no lesions, no masses. Normal hearing. Normal lips.  Musculoskeletal: Normal gait and station of head and neck.     Complexity of Data:  Records Review:   Previous Patient Records  Urine Test Review:   Urinalysis   05/22/21  Urinalysis  Urine Appearance Clear   Urine Color Yellow   Urine Glucose Neg mg/dL  Urine Bilirubin Neg mg/dL  Urine Ketones Neg mg/dL  Urine Specific Gravity 1.015   Urine Blood Neg ery/uL  Urine pH 6.5   Urine Protein Neg mg/dL  Urine Urobilinogen 0.2 mg/dL  Urine Nitrites Neg   Urine Leukocyte Esterase Neg leu/uL   PROCEDURES:          Urinalysis - 81003 Dipstick Dipstick Cont'd  Color: Yellow Bilirubin: Neg mg/dL  Appearance: Clear Ketones: Neg mg/dL  Specific Gravity: 1.015 Blood: Neg ery/uL  pH: 6.5 Protein: Neg mg/dL  Glucose: Neg mg/dL Urobilinogen: 0.2 mg/dL    Nitrites: Neg    Leukocyte Esterase: Neg leu/uL    ASSESSMENT:      ICD-10 Details  1 GU:   Prostate Cancer - C61    PLAN:           Schedule Return Visit/Planned Activity: Keep Scheduled Appointment - Schedule Surgery          Document Letter(s):  Created for Patient: Clinical Summary         Notes:   There are no changes in the patients history or physical exam since last evaluation by Dr. Jeffie Pollock. Pt is scheduled to undergo radioactive seed and space oar placement on 06/05/21.   All pt's questions were  answered to the best of my ability.          Next Appointment:      Next Appointment: 06/05/2021 07:30 AM    Appointment Type: Surgery     Location: [facility] [phone]    Provider: Irine Seal, M.D.    Reason for Visit: [place]

## 2021-06-04 NOTE — Progress Notes (Signed)
  Radiation Oncology         [phone] ________________________________  Name: Ruben Duffy MRN: [id number]  Date: 06/04/2021  DOB: [birth year]       Prostate Seed Implant  CC:Bonnita Hollow, MD  No ref. provider found  DIAGNOSIS: 55 y.o. gentleman with Stage T2a adenocarcinoma of the prostate with Gleason score of 3+4, and PSA of 1.57   Oncology History  Malignant neoplasm of prostate ([facility])  01/08/2021 Cancer Staging   Staging form: Prostate, AJCC 8th Edition - Clinical stage from 01/08/2021: Stage IIB (cT1c, cN0, cM0, PSA: 1.6, Grade Group: 2) - Signed by Freeman Caldron, PA-C on 02/23/2021 Histopathologic type: Adenocarcinoma, NOS Stage prefix: Initial diagnosis Prostate specific antigen (PSA) range: Less than 10 Gleason primary pattern: 3 Gleason secondary pattern: 4 Gleason score: 7 Histologic grading system: 5 grade system Number of biopsy cores examined: 12 Number of biopsy cores positive: 2 Location of positive needle core biopsies: One side   02/23/2021 Initial Diagnosis   Malignant neoplasm of prostate ([facility])    PROCEDURE: Insertion of radioactive I-125 seeds into the prostate gland.  RADIATION DOSE: 145 Gy, definitive therapy.  TECHNIQUE: Ruben Duffy was brought to the operating room with the urologist. He was placed in the dorsolithotomy position. He was catheterized and a rectal tube was inserted. The perineum was shaved, prepped and draped. The ultrasound probe was then introduced into the rectum to see the prostate gland.  TREATMENT DEVICE: A needle grid was attached to the ultrasound probe stand and anchor needles were placed.  3D PLANNING: The prostate was imaged in 3D using a sagittal sweep of the prostate probe. These images were transferred to the planning computer. There, the prostate, urethra and rectum were defined on each axial reconstructed image. Then, the software created an optimized 3D plan and a few seed positions were adjusted. The quality of  the plan was reviewed using [facility] information for the target and the following two organs at risk:  Urethra and Rectum.  Then the accepted plan was printed and handed off to the radiation therapist.  Under my supervision, the custom loading of the seeds and spacers was carried out and loaded into sealed vicryl sleeves.  These pre-loaded needles were then placed into the needle holder.Marland Kitchen  PROSTATE VOLUME STUDY:  Using transrectal ultrasound the volume of the prostate was verified to be 34.3 cc.  SPECIAL TREATMENT PROCEDURE/SUPERVISION AND HANDLING: The pre-loaded needles were then delivered under sagittal guidance. A total of 15 needles were used to deposit 65 seeds in the prostate gland. The individual seed activity was 0.425 mCi.  SpaceOAR:  Yes  COMPLEX SIMULATION: At the end of the procedure, an anterior radiograph of the pelvis was obtained to document seed positioning and count. Cystoscopy was performed to check the urethra and bladder.  MICRODOSIMETRY: At the end of the procedure, the patient was emitting 0.063 mR/hr at 1 meter. Accordingly, he was considered safe for hospital discharge.  PLAN: The patient will return to the radiation oncology clinic for post implant CT dosimetry in three weeks.   ________________________________  Sheral Apley Tammi Klippel, M.D.

## 2021-06-04 NOTE — Telephone Encounter (Signed)
CALLED PATIENT TO REMIND OF PROCEDURE FOR 06-05-21, SPOKE WITH PATIENT'S MOM- MARIE EDWARDS AND SHE IS AWARE OF THIS PROCEDURE

## 2021-06-05 ENCOUNTER — Ambulatory Visit (HOSPITAL_BASED_OUTPATIENT_CLINIC_OR_DEPARTMENT_OTHER)
Admission: RE | Admit: 2021-06-05 | Discharge: 2021-06-05 | Disposition: A | Discharge status: Home / Self Care | Payer: 59 | Source: Ambulatory Visit | Attending: Urology | Admitting: Urology

## 2021-06-05 ENCOUNTER — Ambulatory Visit (HOSPITAL_COMMUNITY): Payer: 59

## 2021-06-05 ENCOUNTER — Encounter (HOSPITAL_BASED_OUTPATIENT_CLINIC_OR_DEPARTMENT_OTHER): Payer: Self-pay | Admitting: Urology

## 2021-06-05 ENCOUNTER — Ambulatory Visit (HOSPITAL_BASED_OUTPATIENT_CLINIC_OR_DEPARTMENT_OTHER): Payer: 59 | Admitting: Physician Assistant

## 2021-06-05 ENCOUNTER — Encounter (HOSPITAL_BASED_OUTPATIENT_CLINIC_OR_DEPARTMENT_OTHER)
Admission: RE | Disposition: A | Discharge status: Home / Self Care | Payer: Self-pay | Source: Ambulatory Visit | Attending: Urology

## 2021-06-05 DIAGNOSIS — Z87891 Personal history of nicotine dependence: Secondary | ICD-10-CM | POA: Insufficient documentation

## 2021-06-05 DIAGNOSIS — C61 Malignant neoplasm of prostate: Secondary | ICD-10-CM | POA: Diagnosis present

## 2021-06-05 DIAGNOSIS — N478 Other disorders of prepuce: Secondary | ICD-10-CM | POA: Diagnosis not present

## 2021-06-05 HISTORY — PX: SPACE OAR INSTILLATION: SHX6769

## 2021-06-05 HISTORY — DX: Left bundle-branch block, unspecified: I44.7

## 2021-06-05 HISTORY — DX: Gastro-esophageal reflux disease without esophagitis: K21.9

## 2021-06-05 HISTORY — DX: Unspecified osteoarthritis, unspecified site: M19.90

## 2021-06-05 HISTORY — DX: Essential (primary) hypertension: I10

## 2021-06-05 HISTORY — DX: Unspecified symptoms and signs involving the genitourinary system: R39.9

## 2021-06-05 HISTORY — PX: RADIOACTIVE SEED IMPLANT: SHX5150

## 2021-06-05 HISTORY — PX: CIRCUMCISION: SHX1350

## 2021-06-05 HISTORY — DX: Hypospadias, balanic: Q54.0

## 2021-06-05 SURGERY — INSERTION, RADIATION SOURCE, PROSTATE
Anesthesia: General | Site: Prostate

## 2021-06-05 MED ORDER — PROPOFOL 10 MG/ML IV BOLUS
INTRAVENOUS | Status: AC
  Filled 2021-06-05: qty 40

## 2021-06-05 MED ORDER — IOHEXOL 300 MG/ML  SOLN
INTRAMUSCULAR | Status: DC | PRN
  Administered 2021-06-05: 7 mL

## 2021-06-05 MED ORDER — ACETAMINOPHEN 325 MG PO TABS: 650.0000 mg | ORAL_TABLET | ORAL | Status: DC | PRN

## 2021-06-05 MED ORDER — BUPIVACAINE HCL (PF) 0.25 % IJ SOLN
INTRAMUSCULAR | Status: DC | PRN
  Administered 2021-06-05: 2 mL

## 2021-06-05 MED ORDER — PHENYLEPHRINE 40 MCG/ML (10ML) SYRINGE FOR IV PUSH (FOR BLOOD PRESSURE SUPPORT)
PREFILLED_SYRINGE | INTRAVENOUS | Status: AC
  Filled 2021-06-05: qty 10

## 2021-06-05 MED ORDER — MIDAZOLAM HCL 2 MG/2ML IJ SOLN
INTRAMUSCULAR | Status: DC | PRN
  Administered 2021-06-05: 2 mg via INTRAVENOUS

## 2021-06-05 MED ORDER — LACTATED RINGERS IV SOLN: INTRAVENOUS | Status: DC

## 2021-06-05 MED ORDER — SODIUM CHLORIDE (PF) 0.9 % IJ SOLN
INTRAMUSCULAR | Status: DC | PRN
  Administered 2021-06-05: 10 mL via INTRAVENOUS

## 2021-06-05 MED ORDER — WHITE PETROLATUM EX OINT
TOPICAL_OINTMENT | CUTANEOUS | Status: AC
  Filled 2021-06-05: qty 5

## 2021-06-05 MED ORDER — SODIUM CHLORIDE 0.9 % IV SOLN: 250.0000 mL | INTRAVENOUS | Status: DC | PRN

## 2021-06-05 MED ORDER — OXYCODONE HCL 5 MG PO TABS: 5.0000 mg | ORAL_TABLET | ORAL | Status: DC | PRN

## 2021-06-05 MED ORDER — DEXAMETHASONE SODIUM PHOSPHATE 10 MG/ML IJ SOLN
INTRAMUSCULAR | Status: DC | PRN
  Administered 2021-06-05: 5 mg via INTRAVENOUS

## 2021-06-05 MED ORDER — PHENYLEPHRINE HCL-NACL 20-0.9 MG/250ML-% IV SOLN
INTRAVENOUS | Status: DC | PRN
  Administered 2021-06-05: 60 ug/min via INTRAVENOUS

## 2021-06-05 MED ORDER — HYDROCODONE-ACETAMINOPHEN 5-325 MG PO TABS: 1.0000 | ORAL_TABLET | Freq: Four times a day (QID) | ORAL | 0 refills | Status: DC | PRN

## 2021-06-05 MED ORDER — SODIUM CHLORIDE 0.9 % IR SOLN
Status: DC | PRN
Start: 2021-06-05 — End: 2021-06-05
  Administered 2021-06-05: 1000 mL via INTRAVESICAL

## 2021-06-05 MED ORDER — FLEET ENEMA 7-19 GM/118ML RE ENEM: 1.0000 | ENEMA | Freq: Once | RECTAL | Status: DC

## 2021-06-05 MED ORDER — FENTANYL CITRATE (PF) 100 MCG/2ML IJ SOLN
INTRAMUSCULAR | Status: DC | PRN
  Administered 2021-06-05 (×2): 25 ug via INTRAVENOUS
  Administered 2021-06-05: 50 ug via INTRAVENOUS

## 2021-06-05 MED ORDER — PHENYLEPHRINE HCL (PRESSORS) 10 MG/ML IV SOLN
INTRAVENOUS | Status: AC
  Filled 2021-06-05: qty 1

## 2021-06-05 MED ORDER — OXYCODONE HCL 5 MG PO TABS: 5.0000 mg | ORAL_TABLET | Freq: Once | ORAL | Status: DC | PRN

## 2021-06-05 MED ORDER — EPHEDRINE SULFATE 50 MG/ML IJ SOLN
INTRAMUSCULAR | Status: DC | PRN
  Administered 2021-06-05: 5 mg via INTRAVENOUS
  Administered 2021-06-05: 10 mg via INTRAVENOUS
  Administered 2021-06-05: 5 mg via INTRAVENOUS

## 2021-06-05 MED ORDER — IBUPROFEN 200 MG PO TABS: 200.0000 mg | ORAL_TABLET | Freq: Four times a day (QID) | ORAL | Status: DC | PRN

## 2021-06-05 MED ORDER — KETOROLAC TROMETHAMINE 30 MG/ML IJ SOLN: 30.0000 mg | Freq: Once | INTRAMUSCULAR | Status: DC | PRN

## 2021-06-05 MED ORDER — SODIUM CHLORIDE 0.9% FLUSH: 3.0000 mL | Freq: Two times a day (BID) | INTRAVENOUS | Status: DC

## 2021-06-05 MED ORDER — FENTANYL CITRATE (PF) 100 MCG/2ML IJ SOLN: 25.0000 ug | INTRAMUSCULAR | Status: DC | PRN

## 2021-06-05 MED ORDER — PROPOFOL 10 MG/ML IV BOLUS
INTRAVENOUS | Status: DC | PRN
  Administered 2021-06-05: 140 mg via INTRAVENOUS

## 2021-06-05 MED ORDER — LIDOCAINE HCL (PF) 1 % IJ SOLN
INTRAMUSCULAR | Status: DC | PRN
  Administered 2021-06-05: 2 mL

## 2021-06-05 MED ORDER — LIDOCAINE 2% (20 MG/ML) 5 ML SYRINGE
INTRAMUSCULAR | Status: DC | PRN
  Administered 2021-06-05: 50 mg via INTRAVENOUS

## 2021-06-05 MED ORDER — CIPROFLOXACIN IN D5W 400 MG/200ML IV SOLN
INTRAVENOUS | Status: AC
  Filled 2021-06-05: qty 200

## 2021-06-05 MED ORDER — 0.9 % SODIUM CHLORIDE (POUR BTL) OPTIME
TOPICAL | Status: DC | PRN
  Administered 2021-06-05: 500 mL

## 2021-06-05 MED ORDER — STERILE WATER FOR IRRIGATION IR SOLN
Status: DC | PRN
  Administered 2021-06-05: 3 mL

## 2021-06-05 MED ORDER — MORPHINE SULFATE (PF) 4 MG/ML IV SOLN: 2.0000 mg | INTRAVENOUS | Status: DC | PRN

## 2021-06-05 MED ORDER — CIPROFLOXACIN IN D5W 400 MG/200ML IV SOLN
400.0000 mg | INTRAVENOUS | Status: AC
  Administered 2021-06-05: 400 mg via INTRAVENOUS

## 2021-06-05 MED ORDER — LIDOCAINE HCL (PF) 2 % IJ SOLN
INTRAMUSCULAR | Status: AC
  Filled 2021-06-05: qty 5

## 2021-06-05 MED ORDER — MIDAZOLAM HCL 2 MG/2ML IJ SOLN
INTRAMUSCULAR | Status: AC
  Filled 2021-06-05: qty 2

## 2021-06-05 MED ORDER — FENTANYL CITRATE (PF) 100 MCG/2ML IJ SOLN
INTRAMUSCULAR | Status: AC
  Filled 2021-06-05: qty 2

## 2021-06-05 MED ORDER — ONDANSETRON HCL 4 MG/2ML IJ SOLN: 4.0000 mg | Freq: Once | INTRAMUSCULAR | Status: DC | PRN

## 2021-06-05 MED ORDER — SODIUM CHLORIDE 0.9% FLUSH: 3.0000 mL | INTRAVENOUS | Status: DC | PRN

## 2021-06-05 MED ORDER — MEPERIDINE HCL 25 MG/ML IJ SOLN: 6.2500 mg | INTRAMUSCULAR | Status: DC | PRN

## 2021-06-05 MED ORDER — PHENYLEPHRINE HCL (PRESSORS) 10 MG/ML IV SOLN
INTRAVENOUS | Status: DC | PRN
  Administered 2021-06-05: 80 ug via INTRAVENOUS
  Administered 2021-06-05 (×3): 120 ug via INTRAVENOUS

## 2021-06-05 MED ORDER — EPHEDRINE 5 MG/ML INJ
INTRAVENOUS | Status: AC
  Filled 2021-06-05: qty 10

## 2021-06-05 MED ORDER — ONDANSETRON HCL 4 MG/2ML IJ SOLN
INTRAMUSCULAR | Status: DC | PRN
  Administered 2021-06-05: 4 mg via INTRAVENOUS

## 2021-06-05 MED ORDER — IBUPROFEN 100 MG/5ML PO SUSP
200.0000 mg | Freq: Four times a day (QID) | ORAL | Status: DC | PRN
  Filled 2021-06-05: qty 20

## 2021-06-05 MED ORDER — OXYCODONE HCL 5 MG/5ML PO SOLN: 5.0000 mg | Freq: Once | ORAL | Status: DC | PRN

## 2021-06-05 MED ORDER — ACETAMINOPHEN 325 MG RE SUPP: 650.0000 mg | RECTAL | Status: DC | PRN

## 2021-06-05 SURGICAL SUPPLY — 64 items
ADH SKN CLS APL DERMABOND .7 (GAUZE/BANDAGES/DRESSINGS) ×3
BAG DRN RND TRDRP ANRFLXCHMBR (UROLOGICAL SUPPLIES) ×3
BAG URINE DRAIN 2000ML AR STRL (UROLOGICAL SUPPLIES) ×4 IMPLANT
BLADE CLIPPER SENSICLIP SURGIC (BLADE) ×4 IMPLANT
BLADE SURG 15 STRL LF DISP TIS (BLADE) ×3 IMPLANT
BLADE SURG 15 STRL SS (BLADE) ×4
BNDG COHESIVE 1X5 TAN STRL LF (GAUZE/BANDAGES/DRESSINGS) IMPLANT
BNDG CONFORM 2 STRL LF (GAUZE/BANDAGES/DRESSINGS) IMPLANT
CATH FOLEY 2WAY SLVR  5CC 16FR (CATHETERS) ×1
CATH FOLEY 2WAY SLVR 5CC 16FR (CATHETERS) ×3 IMPLANT
CATH ROBINSON RED A/P 16FR (CATHETERS) IMPLANT
CATH ROBINSON RED A/P 20FR (CATHETERS) ×4 IMPLANT
CLEANER CAUTERY TIP 5X5 PAD (MISCELLANEOUS) ×3 IMPLANT
CLOTH BEACON ORANGE TIMEOUT ST (SAFETY) ×4 IMPLANT
CNTNR URN SCR LID CUP LEK RST (MISCELLANEOUS) ×6 IMPLANT
CONT SPEC 4OZ STRL OR WHT (MISCELLANEOUS) ×8
COVER BACK TABLE 60X90IN (DRAPES) ×4 IMPLANT
COVER MAYO STAND STRL (DRAPES) ×4 IMPLANT
DERMABOND ADVANCED (GAUZE/BANDAGES/DRESSINGS) ×1
DERMABOND ADVANCED .7 DNX12 (GAUZE/BANDAGES/DRESSINGS) ×3 IMPLANT
DRAPE C-ARM 35X43 STRL (DRAPES) ×4 IMPLANT
DRAPE LAPAROTOMY 100X72 PEDS (DRAPES) ×4 IMPLANT
DRSG TEGADERM 4X4.75 (GAUZE/BANDAGES/DRESSINGS) ×4 IMPLANT
DRSG TEGADERM 8X12 (GAUZE/BANDAGES/DRESSINGS) ×4 IMPLANT
ELECT NEEDLE TIP 2.8 STRL (NEEDLE) IMPLANT
ELECT REM PT RETURN 9FT ADLT (ELECTROSURGICAL) ×4
ELECTRODE REM PT RTRN 9FT ADLT (ELECTROSURGICAL) ×3 IMPLANT
GAUZE 4X4 16PLY ~~LOC~~+RFID DBL (SPONGE) ×4 IMPLANT
GAUZE SPONGE 4X4 12PLY STRL LF (GAUZE/BANDAGES/DRESSINGS) ×4 IMPLANT
GAUZE XEROFORM 1X8 LF (GAUZE/BANDAGES/DRESSINGS) ×4 IMPLANT
GLOVE SURG ENC MOIS LTX SZ6.5 (GLOVE) ×4 IMPLANT
GLOVE SURG ENC MOIS LTX SZ7.5 (GLOVE) IMPLANT
GLOVE SURG ENC MOIS LTX SZ8 (GLOVE) IMPLANT
GLOVE SURG ORTHO LTX SZ8.5 (GLOVE) ×8 IMPLANT
GLOVE SURG POLYISO LF SZ8 (GLOVE) ×8 IMPLANT
GLOVE SURG UNDER POLY LF SZ6.5 (GLOVE) IMPLANT
GOWN STRL REUS W/TWL LRG LVL3 (GOWN DISPOSABLE) ×24 IMPLANT
GOWN STRL REUS W/TWL XL LVL3 (GOWN DISPOSABLE) ×4 IMPLANT
HOLDER FOLEY CATH W/STRAP (MISCELLANEOUS) ×4 IMPLANT
I-Seed AgX100 ×260 IMPLANT
IMPL SPACEOAR VUE SYSTEM (Spacer) ×3 IMPLANT
IMPLANT SPACEOAR VUE SYSTEM (Spacer) ×4 IMPLANT
IV NS 1000ML (IV SOLUTION) ×4
IV NS 1000ML BAXH (IV SOLUTION) ×3 IMPLANT
KIT TURNOVER CYSTO (KITS) ×4 IMPLANT
MANIFOLD NEPTUNE II (INSTRUMENTS) IMPLANT
MARKER SKIN DUAL TIP RULER LAB (MISCELLANEOUS) ×8 IMPLANT
NEEDLE HYPO 25X1 1.5 SAFETY (NEEDLE) ×8 IMPLANT
NS IRRIG 500ML POUR BTL (IV SOLUTION) ×4 IMPLANT
PACK BASIN DAY SURGERY FS (CUSTOM PROCEDURE TRAY) ×4 IMPLANT
PACK CYSTO (CUSTOM PROCEDURE TRAY) ×4 IMPLANT
PAD CLEANER CAUTERY TIP 5X5 (MISCELLANEOUS) ×1
PENCIL SMOKE EVACUATOR (MISCELLANEOUS) ×4 IMPLANT
SURGILUBE 2OZ TUBE FLIPTOP (MISCELLANEOUS) IMPLANT
SUT BONE WAX W31G (SUTURE) IMPLANT
SUT CHROMIC 4 0 PS 2 18 (SUTURE) ×16 IMPLANT
SYR 10ML LL (SYRINGE) ×4 IMPLANT
SYR CONTROL 10ML LL (SYRINGE) ×8 IMPLANT
TOWEL OR 17X26 10 PK STRL BLUE (TOWEL DISPOSABLE) ×4 IMPLANT
TRAY DSU PREP LF (CUSTOM PROCEDURE TRAY) ×4 IMPLANT
TUBE CONNECTING 12X1/4 (SUCTIONS) IMPLANT
UNDERPAD 30X36 HEAVY ABSORB (UNDERPADS AND DIAPERS) ×8 IMPLANT
WATER STERILE IRR 3000ML UROMA (IV SOLUTION) IMPLANT
WATER STERILE IRR 500ML POUR (IV SOLUTION) ×4 IMPLANT

## 2021-06-05 NOTE — Interval H&P Note (Signed)
History and Physical Interval Note: Circumcision added to the consent.  We have discussed the risks and benefits of this procedure in detail in the past and how a circumcision would impact any possible meatoplasty for his coronal hypospadius.  He is fully informed and wished to proceed with the circumcision.   06/05/2021 7:11 AM  Ruben Duffy  has presented today for surgery, with the diagnosis of PROSTATE CANCER.  The various methods of treatment have been discussed with the patient and family. After consideration of risks, benefits and other options for treatment, the patient has consented to  Procedure(s): RADIOACTIVE SEED IMPLANT/BRACHYTHERAPY IMPLANT (N/A) SPACE OAR INSTILLATION (N/A) CIRCUMCISION ADULT (N/A) as a surgical intervention.  The patient's history has been reviewed, patient examined, no change in status, stable for surgery.  I have reviewed the patient's chart and labs.  Questions were answered to the patient's satisfaction.     Irine Seal

## 2021-06-05 NOTE — Op Note (Signed)
PATIENT:  Ruben Duffy  PRE-OPERATIVE DIAGNOSIS:  Adenocarcinoma of the prostate, Redundent foreskin  POST-OPERATIVE DIAGNOSIS:  Same  PROCEDURE:  Procedure(s): 1. I-125 radioactive seed implantation 2. SpaceOAR implantation. 3.  Cystoscopy 4. Circumcision.   SURGEON:  Surgeon(s): Irine Seal MD  Resident Surgeon: Will Pakistan MD  Radiation oncologist: Dr. Tyler Pita  ANESTHESIA:  General  EBL:  Minimal  DRAINS: 58 French Foley catheter  INDICATION: [name] is a 55 y.o. with Stage T2a, Gleason 7(3+4)  prostate cancer who has elected brachytherapy for treatment.  He has a coronal hypospadius with a redundant dorsal foreskin and he would like a circumcision but no correction of the hypospadius.  Description of procedure: After informed consent the patient was brought to the major OR, placed on the table and administered general anesthesia. He was then moved to the modified lithotomy position with his perineum perpendicular to the floor. His perineum and genitalia were then sterilely prepped. An official timeout was then performed. A 16 French Foley catheter was then placed in the bladder and filled with dilute contrast, a rectal tube was placed in the rectum and the transrectal ultrasound probe was placed in the rectum and affixed to the stand. He was then sterilely draped.  The sterile grid was installed.   Anchor needles were then placed.   Real time ultrasonography was used along with the seed planning software spot-pro version 3.1-00. This was used to develop the seed plan including the number of needles as well as number of seeds required for complete and adequate coverage. Real-time ultrasonography was then used along with the previously developed plan  to implant a total of 65 seeds using 18 needles for a target dose of 145 Gy. This proceeded without difficulty or complication.  The anchor needles and guide were removed and the SpaceOAR needle was passed under US guidance  into the fat stripe posterior to the prostate with the tip in the midline at mid prostate. A puff of NS confirmed appropriate positioning and the SpaceOAR view polymer was then injected over 10 seconds into the space with excellent distribution.     A Foley catheter was then removed as well as the transrectal ultrasound probe and rectal probe. Flexible cystoscopy was then performed using the 17 French flexible scope which revealed a normal urethra throughout its length down to the sphincter which appeared intact. The prostatic urethra was about 2cm with lateral lobe hyperplasia. The bladder was then entered and fully and systematically inspected.  The ureteral orifices were noted to be of normal configuration and position. The mucosa revealed no evidence of tumors. There were also no stones identified within the bladder.  No seeds or spacers were seen and/or removed from the bladder.  The cystoscope was then removed.  The drapes were removed.  The perineum was cleaned and dressed.     He was taken down from lithotomy into the supine position.  His genitalia was reprepped with betadine and he was draped in the usual sterile fashion.   A penile block was performed with 66m of 50/50 1% lidocaine and 0.25% marcaine.  The redundant dorsal skin was marked and the excess skin was excised with the knife and gentle use of the bovie.  Hemostasis was good.  The skin edges were reapproximated with 4-0 chromic interrupted sutures and the wound was reinforced with dermabond.   He  was awakened and taken to recovery room in stable and satisfactory condition. He tolerated procedure well and there were no intraoperative  complications.

## 2021-06-05 NOTE — Transfer of Care (Signed)
Immediate Anesthesia Transfer of Care Note  Patient: Ruben Duffy  Procedure(s) Performed: Procedure(s) (LRB): RADIOACTIVE SEED IMPLANT/BRACHYTHERAPY IMPLANT (N/A) SPACE OAR INSTILLATION (N/A) CIRCUMCISION ADULT (N/A)  Patient Location: PACU  Anesthesia Type: General  Level of Consciousness: awake, oriented, sedated and patient cooperative  Airway & Oxygen Therapy: Patient Spontanous Breathing and Patient connected to face mask oxygen  Post-op Assessment: Report given to PACU RN and Post -op Vital signs reviewed and stable  Post vital signs: Reviewed and stable  Complications: No apparent anesthesia complications  Last Vitals:  Vitals Value Taken Time  BP 135/85 06/05/21 0945  Temp    Pulse 101 06/05/21 0947  Resp 15 06/05/21 0947  SpO2 99 % 06/05/21 0947  Vitals shown include unvalidated device data.  Last Pain:  Vitals:   06/05/21 0555  TempSrc: Oral  PainSc: 3       Patients Stated Pain Goal: 4 ([id number])  Complications: No notable events documented.

## 2021-06-05 NOTE — Discharge Instructions (Addendum)
Radioactive Seed Implant Home Care Instructions   Activity:    Rest for the remainder of the day.  Do not drive or operate equipment today.  You may resume normal activities in a few days as instructed by your physician, without risk of harmful radiation exposure to those around you, provided you follow the time and distance precautions on the Radiation Oncology Instruction Sheet.   Meals: Drink plenty of lipuids and eat light foods, such as gelatin or soup this evening .  You may return to normal meal plan tomorrow.  Return To Work: You may return to work as instructed by Naval architect.  Special Instruction:   If any seeds are found, use tweezers to pick up seeds and place in a glass container of any kind and bring to your physician's office.  Call your physician if any of these symptoms occur:  Persistent or heavy bleeding Urine stream diminishes or stops completely after catheter is removed Fever equal to or greater than 101 degrees F Cloudy urine with a strong foul odor Severe pain  You may feel some burning pain and/or hesitancy when you urinate after the catheter is removed.  These symptoms may increase over the next few weeks, but should diminish within forur to six weeks.  Applying moist heat to the lower abdomen or a hot tub bath may help relieve the pain.  If the discomfort becomes severe, please call your physician for additional medications.   Post Anesthesia Home Care Instructions  Activity: Get plenty of rest for the remainder of the day. A responsible individual must stay with you for 24 hours following the procedure.  For the next 24 hours, DO NOT: -Drive a car -Paediatric nurse -Drink alcoholic beverages -Take any medication unless instructed by your physician -Make any legal decisions or sign important papers.  Meals: Start with liquid foods such as gelatin or soup. Progress to regular foods as tolerated. Avoid greasy, spicy, heavy foods. If nausea and/or  vomiting occur, drink only clear liquids until the nausea and/or vomiting subsides. Call your physician if vomiting continues.  Special Instructions/Symptoms: Your throat may feel dry or sore from the anesthesia or the breathing tube placed in your throat during surgery. If this causes discomfort, gargle with warm salt water. The discomfort should disappear within 24 hours.  PROSTATE CANCER TREATMENT WITH RADIOACTIVE IODINE-125 SEED IMPLANT  This instruction sheet is intended to discuss implantation of Iodine-125 seeds as treatment for cancer of the prostate. It will explain in detail what you may expect from this treatment and what precautions are necessary as a result of the treatment. Iodine-125 emits a relatively low energy radiation. The radioactive seeds are surgically implanted directly into the prostate gland. Most of the radiation is contained within the prostate gland. A very small amount is present outside the body.The precautions that we ask you to take are to ensure that those around you are protected from unnecessary radiation. The principles of radiation safety that you need to understand are:  DISTANCE: The further a person is from the radioactive implant the less radiation they will be receiving. The amount of radiation received falls off quite rapidly with distance. More specific guidelines are given in the table on the last page.  TIME: The amount of radiation a person is exposed to is directly proportional to the amount of time that is spent in close proximity to the radioactive implant. Very little radiation will be received during short periods. See the table on the last page for  more specific guideline.  CHILDREN UNDER AGE 56 Children should not be allowed to sit on your lap or otherwise be in very close contact for more than a few minutes for the first 6-8 weeks following the implant. You may affectionately greet (hug/kiss) a child for a short period of time, but remember, the  longer you are in close proximity with that child the more radiation they are being exposed to. At a distance of 6 feet there is no limit to the length of time you may spend together. See specific guidelines on the last page.  PREGNANT OR POSSIBLY PREGNANT WOMEN Pregnant women should avoid prolonged close physical contact with you for the first 6-8 weeks after implant. At a distance of 6 feet there is no limit to the length of time you may spend together. Pregnant women or possibly pregnant women can safely be in close contact with you for a limited period of time. See the last page for guidelines.  FAMILY RELATIONS You may sleep in the same bed as your partner (provided she is not pregnant or under the age of 12). Sexual intercourse, using a condom, may be resumed 2 weeks after the implant. Your semen may be discolored, dark brown or black. This is normal and is the result of bleeding that may have occurred during the implant. After 3-4 weeks it will not be necessary to use a condom.  DAILY ACTIVITIES You may resume normal activities in a few days (example: work, shopping, church) without the risk of harmful radiation exposure to those around you provided you keep in mind the time and distance precautions. Objects that you touch or item that you use do not become radioactive. Linens, clothing, tableware, and dishes may be used by other persons without special precautions. Your bodily wastes (urine and stool) are not radioactive.  SPECIAL PRECAUTIONS It is possible to lose implanted Iodine-125 seed(s) through urination. Although it is possible to pass seeds indefinitely, it is most likely to occur immediately after catheter removal. To prevent this from happening the catheter that was in place during the implant procedure is removed immediately after the implant and a cystoscopy procedure is performed. The process of removing the catheter and the cystoscopy procedure should dislodge and remove any  seeds that are not firmly imbedded in the prostate tissue. However, you should watch for seeds if/when you remove your catheter at home. The seeds are silver colored and the size of a grain of rice. In the unlikely event that a seed is seen after urination, simply flush the seed down the toilet. The seed should not be handled with your fingers, not even with a glove or napkin. A spoon or tweezers can be used to pick up a seed. The Radiation Oncology department is open Monday - Friday from 8:00 am to 5:30 pm with a Radiation Oncologist on call at all times. He or she may be reached by calling [phone]. If you are to be hospitalized or if death should occur, your family should notify the Runner, broadcasting/film/video.  SIDE EFFECTS There are very few side effects associate with the implant procedure. Minor burning with urination, weak stream, hesitancy, intermittency, frequency, mild pain or feeling unable to pass your urine freely are common and usually stop in one to four months. If these symptoms are extremely uncomfortable, contact your physician.  RADIATION SAFETY GUIDELINES PROSTATE CANCER TREATMENT WITH RADIOACTIVE IODINE-125 SEED IMPLANT  The following guidelines will limit exposure to less than naturally occurring background radiation.  PERSONS AGE 66-45 (if able to become pregnant)  FOR 8 WEEKS FOLLOWING IMPLANT  At a distance of 1 foot: limit time to less than 2 hours/week At a distance of 3 feet: limit time to 20 hours/week At a distance of 6 feet: no restrictions  AFTER 8 WEEKS No restrictions  CHILDREN UNDER AGE 66, PREGNANT WOMEN OR POSSIBLY PREGNANT WOMEN  FOR 8 WEEKS FOLLOWING IMPLANT At a distance of 1 foot: limit time to 10 minutes/week At a distance of 3 feet: limit time to 2 hours/week At a distance of 6 feet: no restrictions  AFTER 8 WEEKS No restrictions  PERSONS OVER THE AGE OF 45 AND DO NOT EXPECT TO HAVE ANY MORE CHILDREN No restrictions  Updated by SCP in  January 2020

## 2021-06-05 NOTE — Anesthesia Procedure Notes (Signed)
Procedure Name: LMA Insertion Date/Time: 06/05/2021 7:51 AM Performed by: Suan Halter, CRNA Pre-anesthesia Checklist: Patient identified, Emergency Drugs available, Suction available and Patient being monitored Patient Re-evaluated:Patient Re-evaluated prior to induction Oxygen Delivery Method: Circle system utilized Preoxygenation: Pre-oxygenation with 100% oxygen Induction Type: IV induction Ventilation: Mask ventilation without difficulty LMA: LMA inserted LMA Size: 4.0 Number of attempts: 1 Airway Equipment and Method: Bite block Placement Confirmation: positive ETCO2 Tube secured with: Tape Dental Injury: Teeth and Oropharynx as per pre-operative assessment

## 2021-06-05 NOTE — Anesthesia Preprocedure Evaluation (Signed)
Anesthesia Evaluation  Patient identified by MRN, date of birth, ID band Patient awake    Reviewed: Allergy & Precautions, NPO status , Patient's Chart, lab work & pertinent test results  Airway Mallampati: I       Dental no notable dental hx.    Pulmonary former smoker,    Pulmonary exam normal        Cardiovascular hypertension, Pt. on medications Normal cardiovascular exam     Neuro/Psych PSYCHIATRIC DISORDERS Anxiety Depression    GI/Hepatic GERD  Medicated and Controlled,  Endo/Other  negative endocrine ROS  Renal/GU negative Renal ROS  negative genitourinary   Musculoskeletal   Abdominal Normal abdominal exam  (+)   Peds  Hematology negative hematology ROS (+)   Anesthesia Other Findings   Reproductive/Obstetrics                             Anesthesia Physical Anesthesia Plan  ASA: 2  Anesthesia Plan: General   Post-op Pain Management:    Induction: Intravenous  PONV Risk Score and Plan: 3 and Ondansetron, Dexamethasone and Midazolam  Airway Management Planned: LMA  Additional Equipment: None  Intra-op Plan:   Post-operative Plan: Extubation in OR  Informed Consent: I have reviewed the patients History and Physical, chart, labs and discussed the procedure including the risks, benefits and alternatives for the proposed anesthesia with the patient or authorized representative who has indicated his/her understanding and acceptance.     Dental advisory given  Plan Discussed with: CRNA  Anesthesia Plan Comments:         Anesthesia Quick Evaluation

## 2021-06-05 NOTE — Anesthesia Postprocedure Evaluation (Signed)
Anesthesia Post Note  Patient: Ruben Duffy  Procedure(s) Performed: RADIOACTIVE SEED IMPLANT/BRACHYTHERAPY IMPLANT (Prostate) SPACE OAR INSTILLATION (Perineum) CIRCUMCISION ADULT (Penis)     Patient location during evaluation: Phase II Anesthesia Type: General Level of consciousness: awake Pain management: pain level controlled Vital Signs Assessment: post-procedure vital signs reviewed and stable Cardiovascular status: stable Postop Assessment: no apparent nausea or vomiting Anesthetic complications: no   No notable events documented.  Last Vitals:  Vitals:   06/05/21 0945 06/05/21 1000  BP: 135/85 107/82  Pulse: (!) 108 89  Resp: 16 20  Temp:  36.4 C  SpO2: 98% 98%    Last Pain:  Vitals:   06/05/21 1000  TempSrc:   PainSc: 0-No pain                 John F Salome Arnt

## 2021-06-06 ENCOUNTER — Encounter (HOSPITAL_BASED_OUTPATIENT_CLINIC_OR_DEPARTMENT_OTHER): Payer: Self-pay | Admitting: Urology

## 2021-06-26 ENCOUNTER — Telehealth: Payer: Self-pay | Admitting: *Deleted

## 2021-06-26 NOTE — Telephone Encounter (Signed)
CALLED PATIENT TO REMIND OF POST SEED APPTS. FOR 06-28-21, SPOKE WITH PATIENT AND HE IS AWARE OF THESE APPTS.

## 2021-06-27 NOTE — Progress Notes (Signed)
Radiation Oncology         [phone]) [phone] ________________________________  Name: Ruben Duffy MRN: [id number]  Date: 06/28/2021  DOB: [birth year]  Post-Seed Follow-Up Visit Note  CC: Bonnita Hollow, MD  Irine Seal, MD  Diagnosis:   55 y.o. gentleman with Stage T2a adenocarcinoma of the prostate with Gleason score of 3+4, and PSA of 1.57    ICD-10-CM   1. Malignant neoplasm of prostate ([facility])  C61       Interval Since Last Radiation:  3 weeks 06/05/21:  Insertion of radioactive I-125 seeds into the prostate gland; 145 Gy, definitive therapy with placement of SpaceOAR gel.  Narrative:  The patient returns today for routine follow-up.  He is complaining of increased urinary frequency and urinary hesitation symptoms. He filled out a questionnaire regarding urinary function today providing and overall IPSS score of 15 characterizing his symptoms as moderate and pretty much back to his baseline at this point.  He specifically denies dysuria, gross hematuria, straining to void, incomplete bladder emptying or incontinence.  His pre-implant score was 17. He denies any abdominal pain or bowel symptoms.  He reports a healthy appetite and is maintaining his weight.  He has not noticed any impact on his energy level and overall, is quite pleased with his progress to date.  ALLERGIES:  has No Known Allergies.  Meds: Current Outpatient Medications  Medication Sig Dispense Refill   Ascorbic Acid (VITAMIN C) 1000 MG tablet Take 1,000 mg by mouth daily.     Cholecalciferol (VITAMIN D3) 1.25 MG (50000 UT) CAPS Take by mouth.     DULoxetine (CYMBALTA) 60 MG capsule Take 60 mg by mouth daily.     HYDROcodone-acetaminophen (NORCO/VICODIN) 5-325 MG tablet Take 1 tablet by mouth every 6 (six) hours as needed for moderate pain. 8 tablet 0   ibuprofen (ADVIL) 600 MG tablet Take 600 mg by mouth 3 (three) times daily. (Patient not taking: Reported on 05/09/2021)     lisinopril-hydrochlorothiazide (ZESTORETIC)  20-12.5 MG tablet Take 1 tablet by mouth daily.     meloxicam (MOBIC) 15 MG tablet Take 1 tablet by mouth daily.     Multiple Vitamin (MULTIVITAMIN ADULT PO) Take by mouth.     omeprazole (PRILOSEC) 40 MG capsule Take 1 capsule by mouth daily.     sildenafil (REVATIO) 20 MG tablet      tamsulosin (FLOMAX) 0.4 MG CAPS capsule Take 1 capsule (0.4 mg total) by mouth daily after supper. 30 capsule 3   triamcinolone (KENALOG) 0.025 % ointment Apply topically 2 (two) times daily. (Patient not taking: Reported on 05/09/2021)     valACYclovir (VALTREX) 500 MG tablet Take 500 mg by mouth 2 (two) times daily as needed.     vitamin B-12 (CYANOCOBALAMIN) 1000 MCG tablet Take 1,000 mcg by mouth daily.     No current facility-administered medications for this visit.    Physical Findings: In general this is a well appearing Caucasian male in no acute distress. He's alert and oriented x4 and appropriate throughout the examination. Cardiopulmonary assessment is negative for acute distress and he exhibits normal effort.   Lab Findings: Lab Results  Component Value Date   WBC 6.3 06/01/2021   HGB 13.8 06/01/2021   HCT 44.1 06/01/2021   MCV 86.5 06/01/2021   PLT 285 06/01/2021    Radiographic Findings:  Patient underwent CT imaging in our clinic for post implant dosimetry. The CT will be reviewed by Dr. Tammi Klippel to confirm there is an adequate distribution of  radioactive seeds throughout the prostate gland and ensure that there are no seeds in or near the rectum.  We suspect the final radiation plan and dosimetry will show appropriate coverage of the prostate gland. He understands that we will call and inform him of any unexpected findings on further review of his imaging and dosimetry.  Impression/Plan: 55 y.o. gentleman with Stage T2a adenocarcinoma of the prostate with Gleason score of 3+4, and PSA of 1.57 The patient is recovering from the effects of radiation. His urinary symptoms should gradually  improve over the next 4-6 months. We talked about this today. He is encouraged by his improvement already and is otherwise pleased with his outcome. We also talked about long-term follow-up for prostate cancer following seed implant. He understands that ongoing PSA determinations and digital rectal exams will help perform surveillance to rule out disease recurrence. He saw Jiles Crocker, NP on 06/26/2021 and has a follow up appointment scheduled with Dr. Jeffie Pollock on October 16, 2021. He understands what to expect with his PSA measures. Patient was also educated today about some of the long-term effects from radiation including a small risk for rectal bleeding and possibly erectile dysfunction. We talked about some of the general management approaches to these potential complications. However, I did encourage the patient to contact our office or return at any point if he has questions or concerns related to his previous radiation and prostate cancer.    Nicholos Johns, PA-C

## 2021-06-27 NOTE — Progress Notes (Signed)
  Radiation Oncology         [phone] ________________________________  Name: Ruben Duffy MRN: [id number]  Date: 06/28/2021  DOB: 29-Jul-1966  COMPLEX SIMULATION NOTE  NARRATIVE:  The patient was brought to the [facility] today following prostate seed implantation approximately one month ago.  Identity was confirmed.  All relevant records and images related to the planned course of therapy were reviewed.  Then, the patient was set-up supine.  CT images were obtained.  The CT images were loaded into the planning software.  Then the prostate and rectum were contoured.  Treatment planning then occurred.  The implanted iodine 125 seeds were identified by the physics staff for projection of radiation distribution  I have requested : 3D Simulation  I have requested a DVH of the following structures: Prostate and rectum.    ________________________________  Sheral Apley Tammi Klippel, M.D.

## 2021-06-28 ENCOUNTER — Other Ambulatory Visit: Payer: Self-pay | Admitting: Urology

## 2021-06-28 ENCOUNTER — Ambulatory Visit
Admission: RE | Admit: 2021-06-28 | Discharge: 2021-06-28 | Disposition: A | Discharge status: Home / Self Care | Payer: 59 | Source: Ambulatory Visit | Attending: Urology | Admitting: Urology

## 2021-06-28 ENCOUNTER — Other Ambulatory Visit: Payer: Self-pay

## 2021-06-28 ENCOUNTER — Encounter: Payer: Self-pay | Admitting: Urology

## 2021-06-28 ENCOUNTER — Ambulatory Visit
Admission: RE | Admit: 2021-06-28 | Discharge: 2021-06-28 | Disposition: A | Discharge status: Home / Self Care | Payer: 59 | Source: Ambulatory Visit | Attending: Radiation Oncology | Admitting: Radiation Oncology

## 2021-06-28 VITALS — BP 118/76 | HR 82 | Temp 97.9°F | Resp 20 | Ht 68.0 in | Wt 192.2 lb

## 2021-06-28 DIAGNOSIS — Z791 Long term (current) use of non-steroidal anti-inflammatories (NSAID): Secondary | ICD-10-CM | POA: Diagnosis not present

## 2021-06-28 DIAGNOSIS — C61 Malignant neoplasm of prostate: Secondary | ICD-10-CM | POA: Insufficient documentation

## 2021-06-28 DIAGNOSIS — Z923 Personal history of irradiation: Secondary | ICD-10-CM | POA: Insufficient documentation

## 2021-06-28 DIAGNOSIS — R3911 Hesitancy of micturition: Secondary | ICD-10-CM | POA: Diagnosis not present

## 2021-06-28 DIAGNOSIS — R350 Frequency of micturition: Secondary | ICD-10-CM | POA: Diagnosis not present

## 2021-06-28 DIAGNOSIS — Z79899 Other long term (current) drug therapy: Secondary | ICD-10-CM | POA: Insufficient documentation

## 2021-06-28 NOTE — Progress Notes (Signed)
Patient reports dysuria, nocturia x3 w/ urinary frequency/ urgency, and mild weakness to urine stream. No other symptoms reported at this time. Flomax taken as directed.  Urology follow up 10/16/21  I-PSS Score of 15 (moderate).  Meaningful use complete.  BP 118/76 (BP Location: Left Arm, Patient Position: Sitting, Cuff Size: Normal)   Pulse 82   Temp 97.9 F (36.6 C)   Resp 20   Ht '5\' 8"'$  (1.727 m)   Wt 192 lb 3.2 oz (87.2 kg)   SpO2 97%   BMI 29.22 kg/m

## 2021-07-23 ENCOUNTER — Telehealth: Payer: Self-pay | Admitting: Adult Health

## 2021-07-23 ENCOUNTER — Ambulatory Visit
Admission: RE | Admit: 2021-07-23 | Discharge: 2021-07-23 | Disposition: A | Discharge status: Home / Self Care | Payer: 59 | Source: Ambulatory Visit | Attending: Radiation Oncology | Admitting: Radiation Oncology

## 2021-07-23 ENCOUNTER — Encounter: Payer: Self-pay | Admitting: Radiation Oncology

## 2021-07-23 DIAGNOSIS — C61 Malignant neoplasm of prostate: Secondary | ICD-10-CM | POA: Diagnosis present

## 2021-07-23 NOTE — Telephone Encounter (Signed)
Called patient to discuss need for SCP visit.  He would like to receive an SCP visit in person.  He is off work on Friday, 10/21, and would prefer a visit this day so that he doesn't have to miss work. Schedule message sent.  I reviewed with him where to go and what to expect.    Wilber Bihari, NP

## 2021-07-24 NOTE — Progress Notes (Signed)
  Radiation Oncology         [phone] ________________________________  Name: Ruben Duffy MRN: [id number]  Date: 07/23/2021  DOB: May 25, 1966  3D Planning Note   Prostate Brachytherapy Post-Implant Dosimetry  Diagnosis: 55 y.o. 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  Narrative: On a previous date, Ruben Duffy returned following prostate seed implantation for post implant planning. He underwent CT scan complex simulation to delineate the three-dimensional structures of the pelvis and demonstrate the radiation distribution.  Since that time, the seed localization, and complex isodose planning with dose volume histograms have now been completed.  Results:   Prostate Coverage - The dose of radiation delivered to the 90% or more of the prostate gland (D90) was 114.7% of the prescription dose. This exceeds our goal of greater than 90%. Rectal Sparing - The volume of rectal tissue receiving the prescription dose or higher was 0.0 cc. This falls under our thresholds tolerance of 1.0 cc.  Impression: The prostate seed implant appears to show adequate target coverage and appropriate rectal sparing.  Plan:  The patient will continue to follow with urology for ongoing PSA determinations. I would anticipate a high likelihood for local tumor control with minimal risk for rectal morbidity.  ________________________________  Sheral Apley Tammi Klippel, M.D.

## 2021-07-26 ENCOUNTER — Other Ambulatory Visit: Payer: Self-pay | Admitting: *Deleted

## 2021-07-27 ENCOUNTER — Telehealth: Payer: Self-pay | Admitting: *Deleted

## 2021-07-27 ENCOUNTER — Inpatient Hospital Stay: Payer: 59 | Attending: Physician Assistant | Admitting: Physician Assistant

## 2021-07-27 ENCOUNTER — Other Ambulatory Visit: Payer: Self-pay

## 2021-07-27 ENCOUNTER — Encounter: Payer: Self-pay | Admitting: Physician Assistant

## 2021-07-27 ENCOUNTER — Inpatient Hospital Stay: Payer: 59

## 2021-07-27 VITALS — BP 106/63 | HR 75 | Temp 97.4°F | Resp 17 | Wt 197.3 lb

## 2021-07-27 DIAGNOSIS — Z809 Family history of malignant neoplasm, unspecified: Secondary | ICD-10-CM

## 2021-07-27 DIAGNOSIS — C61 Malignant neoplasm of prostate: Secondary | ICD-10-CM

## 2021-07-27 DIAGNOSIS — Z122 Encounter for screening for malignant neoplasm of respiratory organs: Secondary | ICD-10-CM

## 2021-07-27 DIAGNOSIS — Z808 Family history of malignant neoplasm of other organs or systems: Secondary | ICD-10-CM

## 2021-07-27 DIAGNOSIS — Z1211 Encounter for screening for malignant neoplasm of colon: Secondary | ICD-10-CM

## 2021-07-27 DIAGNOSIS — R300 Dysuria: Secondary | ICD-10-CM

## 2021-07-27 DIAGNOSIS — Z801 Family history of malignant neoplasm of trachea, bronchus and lung: Secondary | ICD-10-CM | POA: Diagnosis not present

## 2021-07-27 DIAGNOSIS — Z87891 Personal history of nicotine dependence: Secondary | ICD-10-CM | POA: Insufficient documentation

## 2021-07-27 LAB — URINALYSIS, COMPLETE (UACMP) WITH MICROSCOPIC
Bacteria, UA: NONE SEEN
Bilirubin Urine: NEGATIVE
Glucose, UA: NEGATIVE mg/dL
Hgb urine dipstick: NEGATIVE
Ketones, ur: NEGATIVE mg/dL
Leukocytes,Ua: NEGATIVE
Nitrite: NEGATIVE
Protein, ur: NEGATIVE mg/dL
Specific Gravity, Urine: 1.012 (ref 1.005–1.030)
pH: 6 (ref 5.0–8.0)

## 2021-07-27 NOTE — Addendum Note (Signed)
Addended by: Dede Query T on: 07/27/2021 02:24 PM   Modules accepted: Level of Service

## 2021-07-27 NOTE — Telephone Encounter (Addendum)
Per Dede Query, P.A., faxed GI referral to [facility] at [phone]. Called office to confirm fax. No answer. Also, called pt to make aware of CT lung screening appt at [facility]. Pt verbalized understanding

## 2021-07-27 NOTE — Progress Notes (Addendum)
[place]  [phone]) [phone]   [phone] SURVIVORSHIP VISIT:  Patient Care Team: Bonnita Hollow, MD as PCP - General (Family Medicine) Tyler Pita, MD as Consulting Physician (Radiation Oncology) Irine Seal, MD as Attending Physician (Urology) Cordelia Poche as Physician Assistant (Hematology and Oncology) Harmon Pier, RN as Registered Nurse  Oncology History  Malignant neoplasm of prostate [facility])  01/08/2021 Cancer Staging   Staging form: Prostate, AJCC 8th Edition - Clinical stage from 01/08/2021: Stage IIB (cT1c, cN0, cM0, PSA: 1.6, Grade Group: 2) - Signed by Freeman Caldron, PA-C on 02/23/2021 Histopathologic type: Adenocarcinoma, NOS Stage prefix: Initial diagnosis Prostate specific antigen (PSA) range: Less than 10 Gleason primary pattern: 3 Gleason secondary pattern: 4 Gleason score: 7 Histologic grading system: 5 grade system Number of biopsy cores examined: 12 Number of biopsy cores positive: 2 Location of positive needle core biopsies: One side   02/23/2021 Initial Diagnosis   Malignant neoplasm of prostate ([place])    ONCOLOGIC TREATMENT: 06/05/2021: Insertion of radioactive I-125 seeds into the prostate gland; 145 Gy, definitive therapy with placement of SpaceOAR gel.  Results:   Prostate Coverage - The dose of radiation delivered to the 90% or more of the prostate gland (D90) was 114.7% of the prescription dose. This exceeds our goal of greater than 90%. Rectal Sparing - The volume of rectal tissue receiving the prescription dose or higher was 0.0 cc. This falls under our thresholds tolerance of 1.0 cc.  Impression: The prostate seed implant appears to show adequate target coverage and appropriate rectal sparing.   Plan:  The patient will continue to follow with urology for ongoing PSA determinations. I would anticipate a high likelihood for local tumor control with minimal risk for rectal morbidity  HISTORY OF  PRESENTING ILLNESS:  Ruben Duffy 55 y.o. male presents today to review his survivorship care plan detailing his treatment course for prostate cancer, monitoring long-term side effects of the treatment, education regarding health maintenance, cancer screening and overall wellness and health promotion.  On exam today, Ruben Duffy reports that his energy levels and appetite are fairly stable. He is able to complete all his daily activities independently. He denies nausea, vomiting or abdominal pain. His bowel movements are regular without diarrhea or constipation. He continues to have increased urinary frequency and now he is experiencing sharp, burning sensation with urination. He does notice a mild foul smelling odor as well. He denies hematuria or other signs of bleeding. Patient has chronic shortness of breath with exertion. He denies fevers, chills, night sweats, chest pain or cough. He has no other complaints. Rest of the ROS is below.    MEDICAL HISTORY:  Past Medical History:  Diagnosis Date   Arthritis    GERD (gastroesophageal reflux disease)    Hypertension    Hypospadias, balanic    chronic   LBBB (left bundle branch block)    newly dx by ekg done 04-26-2021 for surgery on 06-05-2021,  pt stated he not had a previous ekg before, so pt sent for cardiac clearance prior to surgery;  cardiologist , dr berry, office note clearance in epic 05-09-2021 gave pt clearance with no further work-up needed   Lower urinary tract symptoms (LUTS)    Prostate cancer [facility])    urologist-- dr Jeffie Pollock--- dx by bx 01-08-2021, Gleason 3+4, T2a    SURGICAL HISTORY: Past Surgical History:  Procedure Laterality Date   BIOPSY PROSTATE     CIRCUMCISION N/A 06/05/2021   Procedure: CIRCUMCISION ADULT;  Surgeon: Irine Seal, MD;  Location: [facility];  Service: Urology;  Laterality: N/A;   HEMORRHOID SURGERY     HYPOSPADIAS CORRECTION     RADIOACTIVE SEED IMPLANT N/A 06/05/2021   Procedure:  RADIOACTIVE SEED IMPLANT/BRACHYTHERAPY IMPLANT;  Surgeon: Irine Seal, MD;  Location: [facility];  Service: Urology;  Laterality: N/A;   SPACE OAR INSTILLATION N/A 06/05/2021   Procedure: SPACE OAR INSTILLATION;  Surgeon: Irine Seal, MD;  Location: [facility];  Service: Urology;  Laterality: N/A;    SOCIAL HISTORY: Social History   Socioeconomic History   Marital status: Single    Spouse name: Not on file   Number of children: 1   Years of education: Not on file   Highest education level: Not on file  Occupational History   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  Tobacco Use   Smoking status: Former    Packs/day: 1.50    Years: 30.00    Pack years: 45.00    Types: Cigarettes    Quit date: 2009    Years since quitting: 13.7   Smokeless tobacco: Never  Vaping Use   Vaping Use: Never used  Substance and Sexual Activity   Alcohol use: Not Currently   Drug use: Not Currently    Types: Marijuana, "Crack" cocaine   Sexual activity: Yes  Other Topics Concern   Not on file  Social History Narrative   Not on file   Social Determinants of Health   Financial Resource Strain: Low Risk    Difficulty of Paying Living Expenses: Not hard at all  Food Insecurity: No Food Insecurity   Worried About Charity fundraiser in the Last Year: Never true   [place] in the Last Year: Never true  Transportation Needs: No Transportation Needs   Lack of Transportation (Medical): No   Lack of Transportation (Non-Medical): No  Physical Activity: Inactive   Days of Exercise per Week: 0 days   Minutes of Exercise per Session: 0 min  Stress: No Stress Concern Present   Feeling of Stress : Not at all  Social Connections: Socially Isolated   Frequency of Communication with Friends and Family: Three times a week   Frequency of Social Gatherings with Friends and Family: Three times a week   Attends Religious Services: Never   Active Member  of Clubs or Organizations: No   Attends Archivist Meetings: Never   Marital Status: Never married  Human resources officer Violence: Not At Risk   Fear of Current or Ex-Partner: No   Emotionally Abused: No   Physically Abused: No   Sexually Abused: No    FAMILY HISTORY: Family History  Problem Relation Age of Onset   Lung cancer Maternal Aunt    Brain cancer Maternal Uncle    Cancer Paternal Grandmother    Lung cancer Maternal Uncle    Lung cancer Maternal Uncle     ALLERGIES:  has No Known Allergies.  MEDICATIONS:  Current Outpatient Medications  Medication Sig Dispense Refill   Ascorbic Acid (VITAMIN C) 1000 MG tablet Take 1,000 mg by mouth daily.     Cholecalciferol (VITAMIN D3) 1.25 MG (50000 UT) CAPS Take by mouth.     DULoxetine (CYMBALTA) 60 MG capsule Take 60 mg by mouth daily.     HYDROcodone-acetaminophen (NORCO/VICODIN) 5-325 MG tablet Take 1 tablet by mouth every 6 (six) hours as needed for moderate pain. 8 tablet 0  ibuprofen (ADVIL) 600 MG tablet Take 600 mg by mouth 3 (three) times daily.     lisinopril-hydrochlorothiazide (ZESTORETIC) 20-12.5 MG tablet Take 1 tablet by mouth daily.     meloxicam (MOBIC) 15 MG tablet Take 1 tablet by mouth daily.     Multiple Vitamin (MULTIVITAMIN ADULT PO) Take by mouth.     omeprazole (PRILOSEC) 40 MG capsule Take 1 capsule by mouth daily.     sildenafil (REVATIO) 20 MG tablet      tamsulosin (FLOMAX) 0.4 MG CAPS capsule Take 1 capsule (0.4 mg total) by mouth daily after supper. 30 capsule 3   triamcinolone (KENALOG) 0.025 % ointment Apply topically 2 (two) times daily.     valACYclovir (VALTREX) 500 MG tablet Take 500 mg by mouth 2 (two) times daily as needed.     vitamin B-12 (CYANOCOBALAMIN) 1000 MCG tablet Take 1,000 mcg by mouth daily.     No current facility-administered medications for this visit.    Social Determinants of Health with Concerns   Tobacco Use: Medium Risk   Smoking Tobacco Use: Former    Smokeless Tobacco Use: Never  Physical Activity: Inactive   Days of Exercise per Week: 0 days   Minutes of Exercise per Session: 0 min  Social Connections: Socially Isolated   Frequency of Communication with Friends and Family: Three times a week   Frequency of Social Gatherings with Friends and Family: Three times a week   Attends Religious Services: Never   Active Member of Clubs or Organizations: No   Attends Archivist Meetings: Never   Marital Status: Never married    Review of Systems  Constitutional:  Negative for chills, fever and weight loss.  Respiratory:  Positive for shortness of breath. Negative for cough and wheezing.   Cardiovascular:  Negative for chest pain, palpitations and leg swelling.  Gastrointestinal:  Negative for abdominal pain, blood in stool, constipation, diarrhea, melena, nausea and vomiting.  Genitourinary:  Positive for dysuria and frequency. Negative for flank pain and hematuria.  Musculoskeletal:  Positive for back pain (low back pain).  Skin:  Negative for rash.   PHYSICAL EXAMINATION: ECOG PERFORMANCE STATUS: 1 - Symptomatic but completely ambulatory  Vitals:   07/27/21 1255  BP: 106/63  Pulse: 75  Resp: 17  Temp: (!) 97.4 F (36.3 C)  SpO2: 98%   Filed Weights   07/27/21 1255  Weight: 197 lb 5 oz (89.5 kg)    GENERAL: well appearing male in NAD  SKIN: skin color, texture, turgor are normal, no rashes or significant lesions EYES: conjunctiva are pink and non-injected, sclera clear OROPHARYNX: no exudate, no erythema; lips, buccal mucosa, and tongue normal  NECK: supple, non-tender LYMPH:  no palpable lymphadenopathy in the cervical or supraclavicular lymph nodes.  LUNGS: clear to auscultation and percussion with normal breathing effort HEART: regular rate & rhythm and no murmurs and no lower extremity edema ABDOMEN: soft, non-tender, non-distended, normal bowel sounds Musculoskeletal: no cyanosis of digits and no clubbing   PSYCH: alert & oriented x 3, fluent speech NEURO: no focal motor/sensory deficits  LABORATORY DATA:  I have reviewed the data as listed CBC Latest Ref Rng & Units 06/01/2021  WBC 4.0 - 10.5 K/uL 6.3  Hemoglobin 13.0 - 17.0 g/dL 13.8  Hematocrit 39.0 - 52.0 % 44.1  Platelets 150 - 400 K/uL 285    CMP Latest Ref Rng & Units 06/01/2021  Glucose 70 - 99 mg/dL 116(H)  BUN 6 - 20 mg/dL 11  Creatinine  0.61 - 1.24 mg/dL 0.97  Sodium 135 - 145 mmol/L 137  Potassium 3.5 - 5.1 mmol/L 4.5  Chloride 98 - 111 mmol/L 104  CO2 22 - 32 mmol/L 28  Calcium 8.9 - 10.3 mg/dL 9.2  Total Protein 6.5 - 8.1 g/dL 6.8  Total Bilirubin 0.3 - 1.2 mg/dL 0.8  Alkaline Phos 38 - 126 U/L 42  AST 15 - 41 U/L 18  ALT 0 - 44 U/L 13    RADIOGRAPHIC STUDIES: No images were reviewed during this visit.   ASSESSMENT & PLAN Ruben Duffy is a 55 y.o. male with stage IIB prostate cancer.  He presents to the survivorship clinic for our initial meeting and routine follow-up post completion of treatment for prostate cancer.  Stage IIB prostate cancer: Ruben Duffy is continue to recover from definitive treatment for prostate cancer.  He is due for a follow-up and PSA testing with Dr.Wrenn on 10/16/2021.  Today, comprehensive survivorship care plan and treatment summary was reviewed with the patient detailing his prostate cancer diagnosis, treatment course, potential late/long-term effects of treatment, appropriate follow-up care with recommendations for the future and patient education resources.  A copy of the summary, along with a letter will be sent to the patient's primary care provider via mail/fax, in basket message after today's visit.  Dysuria: He continues to experience increased urinary frequency but now has dysuria and has notice mild foul smelling odor. I ordered UA and culture to rule out infection. If there is no evidence of infection, then I advised patient to follow up with urology for further recommendations.    Bone Health: He was given education on specific activities to promote bone health.  Cancer screening: Due to Ruben Duffy history of smoking and age, he should receive screening for skin cancers, colon cancer and lung cancer.  This information and our recommendations are listed on the patient's comprehensive care plan/treatment summary and reviewed in detail with the patient.  He is 55 years old and has not yet undergone colon cancer screening so we will make a referral to GI today.  Additionally, patient has a 45-pack-year history and he quit within 15 years so he is eligible for CT low dose lung cancer screening.  Lastly, patient will follow-up with his PCP to complete recommended skin cancer screenings.  Health maintenance and wellness promotion: Ruben Duffy was encouraged to consume 5-7 servings of fruits and vegetables per day. He was also encouraged to engage in moderate to vigorous exercise for 30 minutes per day most days of the week. We discussed the [employer] fitness program, which is designed for cancer survivors to help them become more physically fit after cancer treatments.  He was instructed to limit her alcohol consumption and continue to abstain from tobacco use.    Support services/counseling: It is not uncommon for this period of the patient's cancer care trajectory to be one of many emotions and stressors.  He was given information regarding our available services and encouraged to contact me with any questions or for help enrolling in any of our support group/programs.   FOLLOW UP: -Return to cancer center PRN -Follow up with urology on 10/2021 with repeat PSA  Orders Placed This Encounter  Procedures   Urine Culture    Standing Status:   Future    Standing Expiration Date:   07/27/2022   CT CHEST LUNG CA SCREEN LOW DOSE W/O CM    Standing Status:   Future    Standing Expiration Date:  07/27/2022    Order Specific Question:   Reason for Exam (SYMPTOM  OR DIAGNOSIS  REQUIRED)    Answer:   history of prostate cancer, smoking history    Order Specific Question:   Preferred Imaging Location?    Answer:   [facility]   Urinalysis, Complete w Microscopic    Standing Status:   Future    Standing Expiration Date:   07/27/2022   Ambulatory referral to Gastroenterology    Referral Priority:   Routine    Referral Type:   Consultation    Referral Reason:   Specialty Services Required    Number of Visits Requested:   1    All questions were answered. The patient knows to call the clinic with any problems, questions or concerns.  I have spent a total of 45 minutes minutes of face-to-face and non-face-to-face time, preparing to see the patient, obtaining and/or reviewing separately obtained history, performing a medically appropriate examination, counseling and educating the patient, ordering tests, referring and communicating with other health care professionals, documenting clinical information in the electronic health record and care coordination.   Dede Query, PA-C Department of Hematology/Oncology [place] [facility] Phone: [phone]

## 2021-07-28 LAB — URINE CULTURE: Culture: NO GROWTH

## 2021-07-30 ENCOUNTER — Telehealth: Payer: Self-pay | Admitting: Physician Assistant

## 2021-07-30 NOTE — Telephone Encounter (Signed)
I called [name] to review the urinalysis results.  There is no evidence of urinary tract infection.  Advised patient to follow-up with Dr. Jeffie Pollock if his symptoms do not improve.  Additionally, patient is scheduled 08/08/2021 for a lung cancer screening CT scan.  Additionally, he is waiting to hear from [place] for his screening colonoscopy.  Patient is aware to follow-up if he has any additional questions or concerns.

## 2021-08-02 ENCOUNTER — Ambulatory Visit (AMBULATORY_SURGERY_CENTER): Payer: Self-pay

## 2021-08-02 VITALS — Ht 68.0 in | Wt 190.0 lb

## 2021-08-02 DIAGNOSIS — Z1211 Encounter for screening for malignant neoplasm of colon: Secondary | ICD-10-CM

## 2021-08-02 NOTE — Progress Notes (Signed)
No egg or soy allergy known to patient  No issues with past sedation with any surgeries or procedures Patient denies ever being told they had issues or difficulty with intubation  No FH of Malignant Hyperthermia No diet pills per patient No home 02 use per patient  No blood thinners per patient  Pt denies issues with constipation  No A fib or A flutter  EMMI video to pt or via [place] 19 guidelines implemented in PV today with Pt and CMA Pt is fully vaccinated  for Covid      Due to the COVID-19 pandemic we are asking patients to follow certain guidelines.  Pt aware of COVID protocols and [facility] guidelines

## 2021-08-03 ENCOUNTER — Encounter: Payer: 59 | Admitting: Physician Assistant

## 2021-08-08 ENCOUNTER — Ambulatory Visit (HOSPITAL_COMMUNITY)
Admission: RE | Admit: 2021-08-08 | Discharge: 2021-08-08 | Disposition: A | Discharge status: Home / Self Care | Payer: 59 | Source: Ambulatory Visit | Attending: Physician Assistant | Admitting: Physician Assistant

## 2021-08-08 ENCOUNTER — Other Ambulatory Visit: Payer: Self-pay

## 2021-08-08 DIAGNOSIS — Z122 Encounter for screening for malignant neoplasm of respiratory organs: Secondary | ICD-10-CM | POA: Insufficient documentation

## 2021-08-10 ENCOUNTER — Other Ambulatory Visit: Payer: Self-pay | Admitting: Physician Assistant

## 2021-08-10 DIAGNOSIS — Z122 Encounter for screening for malignant neoplasm of respiratory organs: Secondary | ICD-10-CM

## 2021-08-10 NOTE — Progress Notes (Signed)
I called patient and review CT scan results from today I will make referral to [facility] for Lung cancer screening.

## 2021-08-15 ENCOUNTER — Encounter: Payer: Self-pay | Admitting: Physician Assistant

## 2021-08-15 ENCOUNTER — Encounter: Payer: Self-pay | Admitting: Gastroenterology

## 2021-08-15 ENCOUNTER — Ambulatory Visit (AMBULATORY_SURGERY_CENTER): Payer: 59 | Admitting: Gastroenterology

## 2021-08-15 VITALS — BP 95/70 | HR 74 | Temp 98.4°F | Resp 25 | Ht 68.0 in | Wt 191.0 lb

## 2021-08-15 DIAGNOSIS — K621 Rectal polyp: Secondary | ICD-10-CM

## 2021-08-15 DIAGNOSIS — Z1211 Encounter for screening for malignant neoplasm of colon: Secondary | ICD-10-CM | POA: Diagnosis not present

## 2021-08-15 DIAGNOSIS — D128 Benign neoplasm of rectum: Secondary | ICD-10-CM

## 2021-08-15 MED ORDER — SODIUM CHLORIDE 0.9 % IV SOLN: 500.0000 mL | Freq: Once | INTRAVENOUS | Status: DC

## 2021-08-15 NOTE — Progress Notes (Signed)
GASTROENTEROLOGY PROCEDURE H&P NOTE   Primary Care Physician: Bonnita Hollow, MD  HPI: Ruben Duffy is a 55 y.o. male who presents for Colonoscopy for screening.  Past Medical History:  Diagnosis Date   Arthritis    GERD (gastroesophageal reflux disease)    Hypertension    Hypospadias, balanic    chronic   LBBB (left bundle branch block)    newly dx by ekg done 04-26-2021 for surgery on 06-05-2021,  pt stated he not had a previous ekg before, so pt sent for cardiac clearance prior to surgery;  cardiologist , dr berry, office note clearance in epic 05-09-2021 gave pt clearance with no further work-up needed   Lower urinary tract symptoms (LUTS)    Prostate cancer [facility])    urologist-- dr Jeffie Pollock--- dx by bx 01-08-2021, Gleason 3+4, T2a   Past Surgical History:  Procedure Laterality Date   BIOPSY PROSTATE     CIRCUMCISION N/A 06/05/2021   Procedure: CIRCUMCISION ADULT;  Surgeon: Irine Seal, MD;  Location: [facility];  Service: Urology;  Laterality: N/A;   HEMORRHOID SURGERY     HYPOSPADIAS CORRECTION     RADIOACTIVE SEED IMPLANT N/A 06/05/2021   Procedure: RADIOACTIVE SEED IMPLANT/BRACHYTHERAPY IMPLANT;  Surgeon: Irine Seal, MD;  Location: [facility];  Service: Urology;  Laterality: N/A;   SPACE OAR INSTILLATION N/A 06/05/2021   Procedure: SPACE OAR INSTILLATION;  Surgeon: Irine Seal, MD;  Location: [facility];  Service: Urology;  Laterality: N/A;   Current Outpatient Medications  Medication Sig Dispense Refill   Ascorbic Acid (VITAMIN C) 1000 MG tablet Take 1,000 mg by mouth daily.     Cholecalciferol (VITAMIN D3) 1.25 MG (50000 UT) CAPS Take by mouth.     DULoxetine (CYMBALTA) 60 MG capsule Take 60 mg by mouth daily.     ibuprofen (ADVIL) 600 MG tablet Take 600 mg by mouth 3 (three) times daily.     lisinopril-hydrochlorothiazide (ZESTORETIC) 20-12.5 MG tablet Take 1 tablet by mouth daily.     meloxicam (MOBIC) 15 MG tablet  Take 1 tablet by mouth daily.     Multiple Vitamin (MULTIVITAMIN ADULT PO) Take by mouth.     omeprazole (PRILOSEC) 40 MG capsule Take 1 capsule by mouth daily.     sildenafil (REVATIO) 20 MG tablet      tamsulosin (FLOMAX) 0.4 MG CAPS capsule Take 1 capsule (0.4 mg total) by mouth daily after supper. 30 capsule 3   triamcinolone (KENALOG) 0.025 % ointment Apply topically 2 (two) times daily.     valACYclovir (VALTREX) 500 MG tablet Take 500 mg by mouth 2 (two) times daily as needed.     vitamin B-12 (CYANOCOBALAMIN) 1000 MCG tablet Take 1,000 mcg by mouth daily.     No current facility-administered medications for this visit.    Current Outpatient Medications:    Ascorbic Acid (VITAMIN C) 1000 MG tablet, Take 1,000 mg by mouth daily., Disp: , Rfl:    Cholecalciferol (VITAMIN D3) 1.25 MG (50000 UT) CAPS, Take by mouth., Disp: , Rfl:    DULoxetine (CYMBALTA) 60 MG capsule, Take 60 mg by mouth daily., Disp: , Rfl:    ibuprofen (ADVIL) 600 MG tablet, Take 600 mg by mouth 3 (three) times daily., Disp: , Rfl:    lisinopril-hydrochlorothiazide (ZESTORETIC) 20-12.5 MG tablet, Take 1 tablet by mouth daily., Disp: , Rfl:    meloxicam (MOBIC) 15 MG tablet, Take 1 tablet by mouth daily., Disp: , Rfl:    Multiple  Vitamin (MULTIVITAMIN ADULT PO), Take by mouth., Disp: , Rfl:    omeprazole (PRILOSEC) 40 MG capsule, Take 1 capsule by mouth daily., Disp: , Rfl:    sildenafil (REVATIO) 20 MG tablet, , Disp: , Rfl:    tamsulosin (FLOMAX) 0.4 MG CAPS capsule, Take 1 capsule (0.4 mg total) by mouth daily after supper., Disp: 30 capsule, Rfl: 3   triamcinolone (KENALOG) 0.025 % ointment, Apply topically 2 (two) times daily., Disp: , Rfl:    valACYclovir (VALTREX) 500 MG tablet, Take 500 mg by mouth 2 (two) times daily as needed., Disp: , Rfl:    vitamin B-12 (CYANOCOBALAMIN) 1000 MCG tablet, Take 1,000 mcg by mouth daily., Disp: , Rfl:  No Known Allergies Family History  Problem Relation Age of Onset    Stroke Father    Lung cancer Maternal Aunt    Diabetes Maternal Uncle    Brain cancer Maternal Uncle    Lung cancer Maternal Uncle    Lung cancer Maternal Uncle    Cancer Paternal Grandmother    Diabetes Paternal Grandfather    Colon cancer Neg Hx    Esophageal cancer Neg Hx    Stomach cancer Neg Hx    Pancreatic cancer Neg Hx    Social History   Socioeconomic History   Marital status: Single    Spouse name: Not on file   Number of children: 1   Years of education: Not on file   Highest education level: Not on file  Occupational History   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  Tobacco Use   Smoking status: Former    Packs/day: 1.50    Years: 30.00    Pack years: 45.00    Types: Cigarettes    Quit date: 2009    Years since quitting: 13.8   Smokeless tobacco: Never  Vaping Use   Vaping Use: Never used  Substance and Sexual Activity   Alcohol use: Not Currently   Drug use: Not Currently    Types: Marijuana, "Crack" cocaine   Sexual activity: Yes  Other Topics Concern   Not on file  Social History Narrative   Not on file   Social Determinants of Health   Financial Resource Strain: Low Risk    Difficulty of Paying Living Expenses: Not hard at all  Food Insecurity: No Food Insecurity   Worried About Charity fundraiser in the Last Year: Never true   [place] in the Last Year: Never true  Transportation Needs: No Transportation Needs   Lack of Transportation (Medical): No   Lack of Transportation (Non-Medical): No  Physical Activity: Inactive   Days of Exercise per Week: 0 days   Minutes of Exercise per Session: 0 min  Stress: No Stress Concern Present   Feeling of Stress : Not at all  Social Connections: Socially Isolated   Frequency of Communication with Friends and Family: Three times a week   Frequency of Social Gatherings with Friends and Family: Three times a week   Attends Religious Services: Never   Active Member of Clubs  or Organizations: No   Attends Archivist Meetings: Never   Marital Status: Never married  Human resources officer Violence: Not At Risk   Fear of Current or Ex-Partner: No   Emotionally Abused: No   Physically Abused: No   Sexually Abused: No    Physical Exam: There were no vitals filed for this visit. There is no height or weight on file  to calculate BMI. GEN: NAD EYE: Sclerae anicteric ENT: MMM CV: Non-tachycardic GI: Soft, NT/ND NEURO:  Alert & Oriented x 3  Lab Results: No results for input(s): WBC, HGB, HCT, PLT in the last 72 hours. BMET No results for input(s): NA, K, CL, CO2, GLUCOSE, BUN, CREATININE, CALCIUM in the last 72 hours. LFT No results for input(s): PROT, ALBUMIN, AST, ALT, ALKPHOS, BILITOT, BILIDIR, IBILI in the last 72 hours. PT/INR No results for input(s): LABPROT, INR in the last 72 hours.   Impression / Plan: This is a 55 y.o.male who presents for Colonoscopy for screening.  The risks and benefits of endoscopic evaluation/treatment were discussed with the patient and/or family; these include but are not limited to the risk of perforation, infection, bleeding, missed lesions, lack of diagnosis, severe illness requiring hospitalization, as well as anesthesia and sedation related illnesses.  The patient's history has been reviewed, patient examined, no change in status, and deemed stable for procedure.  The patient and/or family is agreeable to proceed.    Justice Britain, MD [facility] Advanced Endoscopy Office # [phone]

## 2021-08-15 NOTE — Progress Notes (Signed)
Called to room to assist during endoscopic procedure.  Patient ID and intended procedure confirmed with present staff. Received instructions for my participation in the procedure from the performing physician.  

## 2021-08-15 NOTE — Patient Instructions (Signed)
Discharge instructions given. Handouts on polyps,diverticulosis and hemorrhoids. Resume previous medications. YOU HAD AN ENDOSCOPIC PROCEDURE TODAY AT [facility]:   Refer to the procedure report that was given to you for any specific questions about what was found during the examination.  If the procedure report does not answer your questions, please call your gastroenterologist to clarify.  If you requested that your care partner not be given the details of your procedure findings, then the procedure report has been included in a sealed envelope for you to review at your convenience later.  YOU SHOULD EXPECT: Some feelings of bloating in the abdomen. Passage of more gas than usual.  Walking can help get rid of the air that was put into your GI tract during the procedure and reduce the bloating. If you had a lower endoscopy (such as a colonoscopy or flexible sigmoidoscopy) you may notice spotting of blood in your stool or on the toilet paper. If you underwent a bowel prep for your procedure, you may not have a normal bowel movement for a few days.  Please Note:  You might notice some irritation and congestion in your nose or some drainage.  This is from the oxygen used during your procedure.  There is no need for concern and it should clear up in a day or so.  SYMPTOMS TO REPORT IMMEDIATELY:  Following lower endoscopy (colonoscopy or flexible sigmoidoscopy):  Excessive amounts of blood in the stool  Significant tenderness or worsening of abdominal pains  Swelling of the abdomen that is new, acute  Fever of 100F or higher   For urgent or emergent issues, a gastroenterologist can be reached at any hour by calling [phone]. Do not use MyChart messaging for urgent concerns.    DIET:  We do recommend a small meal at first, but then you may proceed to your regular diet.  Drink plenty of fluids but you should avoid alcoholic beverages for 24 hours.  ACTIVITY:  You should  plan to take it easy for the rest of today and you should NOT DRIVE or use heavy machinery until tomorrow (because of the sedation medicines used during the test).    FOLLOW UP: Our staff will call the number listed on your records 48-72 hours following your procedure to check on you and address any questions or concerns that you may have regarding the information given to you following your procedure. If we do not reach you, we will leave a message.  We will attempt to reach you two times.  During this call, we will ask if you have developed any symptoms of COVID 19. If you develop any symptoms (ie: fever, flu-like symptoms, shortness of breath, cough etc.) before then, please call [phone].  If you test positive for Covid 19 in the 2 weeks post procedure, please call and report this information to [place].    If any biopsies were taken you will be contacted by phone or by letter within the next 1-3 weeks.  Please call us at [phone] if you have not heard about the biopsies in 3 weeks.    SIGNATURES/CONFIDENTIALITY: You and/or your care partner have signed paperwork which will be entered into your electronic medical record.  These signatures attest to the fact that that the information above on your After Visit Summary has been reviewed and is understood.  Full responsibility of the confidentiality of this discharge information lies with you and/or your care-partner.

## 2021-08-15 NOTE — Progress Notes (Signed)
To PACU, VSS. Report to [email or web address] 

## 2021-08-15 NOTE — Op Note (Signed)
[facility] Patient Name: Arlynn Halle Procedure Date: 08/15/2021 2:16 PM MRN: [id number] Endoscopist: Justice Britain , MD Age: 55 Referring MD:  Date of Birth: [birth year] Gender: Male Account #: [id number] Procedure:                Colonoscopy Indications:              Screening for colorectal malignant neoplasm, This                            is the patient's first colonoscopy Medicines:                Monitored Anesthesia Care Procedure:                Pre-Anesthesia Assessment:                           - Prior to the procedure, a History and Physical                            was performed, and patient medications and                            allergies were reviewed. The patient's tolerance of                            previous anesthesia was also reviewed. The risks                            and benefits of the procedure and the sedation                            options and risks were discussed with the patient.                            All questions were answered, and informed consent                            was obtained. Prior Anticoagulants: The patient has                            taken no previous anticoagulant or antiplatelet                            agents except for NSAID medication. ASA Grade                            Assessment: II - A patient with mild systemic                            disease. After reviewing the risks and benefits,                            the patient was deemed in satisfactory condition to  undergo the procedure.                           After obtaining informed consent, the colonoscope                            was passed under direct vision. Throughout the                            procedure, the patient's blood pressure, pulse, and                            oxygen saturations were monitored continuously. The                            CF [id number] #[id number] was introduced through the  anus                            and advanced to the the cecum, identified by                            appendiceal orifice and ileocecal valve. The                            colonoscopy was performed without difficulty. The                            patient tolerated the procedure. The quality of the                            bowel preparation was good. The ileocecal valve,                            appendiceal orifice, and rectum were photographed. Scope In: 2:34:19 PM Scope Out: 2:47:25 PM Scope Withdrawal Time: 0 hours 9 minutes 2 seconds  Total Procedure Duration: 0 hours 13 minutes 6 seconds  Findings:                 The digital rectal exam findings include                            hemorrhoids and increased firmness of the prostate.                            Pertinent negatives include no palpable rectal                            lesions.                           Two sessile polyps were found in the rectum. The                            polyps were 2 to 3 mm in size. These polyps were  removed with a cold snare. Resection and retrieval                            were complete.                           Multiple small-mouthed diverticula were found in                            the recto-sigmoid colon, sigmoid colon and                            descending colon.                           A foreign body was found in the distal rectum -                            query previous hemorrhoidectomy staple vs less                            likely brachytherapy seeds.                           Normal mucosa was found in the entire colon                            otherwise.                           Non-bleeding non-thrombosed external and internal                            hemorrhoids were found during retroflexion, during                            perianal exam and during digital exam. The                            hemorrhoids were Grade II  (internal hemorrhoids                            that prolapse but reduce spontaneously). Complications:            No immediate complications. Estimated Blood Loss:     Estimated blood loss was minimal. Impression:               - Hemorrhoids and increased firmness of the                            prostate found on digital rectal exam.                           - Two 2 to 3 mm polyps in the rectum, removed with  a cold snare. Resected and retrieved.                           - Diverticulosis in the recto-sigmoid colon, in the                            sigmoid colon and in the descending colon.                           - Foreign body in the distal rectum - query                            previous hemorrhoidectomy staple vs less likely                            brachytherapy seeds.                           - Normal mucosa in the entire examined colon                            otherwise.                           - Non-bleeding non-thrombosed external and internal                            hemorrhoids. Recommendation:           - The patient will be observed post-procedure,                            until all discharge criteria are met.                           - Discharge patient to home.                           - Patient has a contact number available for                            emergencies. The signs and symptoms of potential                            delayed complications were discussed with the                            patient. Return to normal activities tomorrow.                            Written discharge instructions were provided to the                            patient.                           - High fiber  diet.                           - Use FiberCon 1-2 tablets PO daily.                           - Continue present medications.                           - Await pathology results.                           - Repeat colonoscopy  in 02/17/09 years for                            surveillance based on pathology results and                            findings of adenomatous tissue.                           - The findings and recommendations were discussed                            with the patient.                           - The findings and recommendations were discussed                            with the patient's family. Justice Britain, MD 08/15/2021 2:55:23 PM

## 2021-08-15 NOTE — Progress Notes (Signed)
CHECK-IN-AM  V/S-DT 

## 2021-08-16 NOTE — Telephone Encounter (Signed)
Pt. Requesting CD of CT scan

## 2021-08-17 ENCOUNTER — Telehealth: Payer: Self-pay

## 2021-08-17 NOTE — Telephone Encounter (Signed)
Called [phone] and left a message we tried to reach pt for a follow up call. maw

## 2021-08-17 NOTE — Telephone Encounter (Signed)
Left message on follow up call. 

## 2021-08-20 ENCOUNTER — Encounter: Payer: Self-pay | Admitting: Physician Assistant

## 2021-08-21 ENCOUNTER — Encounter: Payer: Self-pay | Admitting: Gastroenterology

## 2021-08-21 NOTE — Telephone Encounter (Signed)
Called pt regarding mychart message. Pt state while driving home this afternoon he felt like it was very hard to before and he couldn't catch his breath. Pt still currently short winded.  Based on current symptoms, nurse advised to report to ER for further evaluations. Pt verbalized understanding.

## 2021-09-07 ENCOUNTER — Other Ambulatory Visit: Payer: Self-pay

## 2021-09-07 ENCOUNTER — Encounter (HOSPITAL_COMMUNITY): Payer: Self-pay

## 2021-09-07 ENCOUNTER — Emergency Department (HOSPITAL_COMMUNITY): Payer: 59

## 2021-09-07 ENCOUNTER — Inpatient Hospital Stay (HOSPITAL_COMMUNITY)
Admission: EM | Admit: 2021-09-07 | Discharge: 2021-09-11 | DRG: 286 | Disposition: A | Discharge status: Home / Self Care | Payer: 59 | Attending: Internal Medicine | Admitting: Internal Medicine

## 2021-09-07 ENCOUNTER — Inpatient Hospital Stay (HOSPITAL_COMMUNITY): Payer: 59

## 2021-09-07 DIAGNOSIS — R0902 Hypoxemia: Secondary | ICD-10-CM | POA: Diagnosis present

## 2021-09-07 DIAGNOSIS — Z833 Family history of diabetes mellitus: Secondary | ICD-10-CM | POA: Diagnosis not present

## 2021-09-07 DIAGNOSIS — Z20822 Contact with and (suspected) exposure to covid-19: Secondary | ICD-10-CM | POA: Diagnosis not present

## 2021-09-07 DIAGNOSIS — Z79899 Other long term (current) drug therapy: Secondary | ICD-10-CM | POA: Diagnosis not present

## 2021-09-07 DIAGNOSIS — I5021 Acute systolic (congestive) heart failure: Secondary | ICD-10-CM | POA: Diagnosis present

## 2021-09-07 DIAGNOSIS — J439 Emphysema, unspecified: Secondary | ICD-10-CM | POA: Diagnosis present

## 2021-09-07 DIAGNOSIS — I959 Hypotension, unspecified: Secondary | ICD-10-CM | POA: Diagnosis not present

## 2021-09-07 DIAGNOSIS — R008 Other abnormalities of heart beat: Secondary | ICD-10-CM | POA: Diagnosis present

## 2021-09-07 DIAGNOSIS — I340 Nonrheumatic mitral (valve) insufficiency: Secondary | ICD-10-CM | POA: Diagnosis present

## 2021-09-07 DIAGNOSIS — I110 Hypertensive heart disease with heart failure: Secondary | ICD-10-CM | POA: Diagnosis present

## 2021-09-07 DIAGNOSIS — M1990 Unspecified osteoarthritis, unspecified site: Secondary | ICD-10-CM | POA: Diagnosis present

## 2021-09-07 DIAGNOSIS — Z87891 Personal history of nicotine dependence: Secondary | ICD-10-CM

## 2021-09-07 DIAGNOSIS — E119 Type 2 diabetes mellitus without complications: Secondary | ICD-10-CM | POA: Diagnosis present

## 2021-09-07 DIAGNOSIS — I447 Left bundle-branch block, unspecified: Secondary | ICD-10-CM | POA: Diagnosis present

## 2021-09-07 DIAGNOSIS — R0602 Shortness of breath: Secondary | ICD-10-CM | POA: Diagnosis present

## 2021-09-07 DIAGNOSIS — N179 Acute kidney failure, unspecified: Secondary | ICD-10-CM | POA: Diagnosis not present

## 2021-09-07 DIAGNOSIS — J811 Chronic pulmonary edema: Secondary | ICD-10-CM | POA: Diagnosis present

## 2021-09-07 DIAGNOSIS — K219 Gastro-esophageal reflux disease without esophagitis: Secondary | ICD-10-CM | POA: Diagnosis present

## 2021-09-07 DIAGNOSIS — Z791 Long term (current) use of non-steroidal anti-inflammatories (NSAID): Secondary | ICD-10-CM

## 2021-09-07 DIAGNOSIS — I509 Heart failure, unspecified: Secondary | ICD-10-CM | POA: Diagnosis not present

## 2021-09-07 DIAGNOSIS — R0609 Other forms of dyspnea: Secondary | ICD-10-CM

## 2021-09-07 DIAGNOSIS — I2510 Atherosclerotic heart disease of native coronary artery without angina pectoris: Secondary | ICD-10-CM | POA: Diagnosis present

## 2021-09-07 DIAGNOSIS — I214 Non-ST elevation (NSTEMI) myocardial infarction: Secondary | ICD-10-CM

## 2021-09-07 DIAGNOSIS — Z8546 Personal history of malignant neoplasm of prostate: Secondary | ICD-10-CM

## 2021-09-07 DIAGNOSIS — I493 Ventricular premature depolarization: Secondary | ICD-10-CM

## 2021-09-07 DIAGNOSIS — I5041 Acute combined systolic (congestive) and diastolic (congestive) heart failure: Secondary | ICD-10-CM

## 2021-09-07 DIAGNOSIS — I428 Other cardiomyopathies: Secondary | ICD-10-CM | POA: Diagnosis not present

## 2021-09-07 LAB — COMPREHENSIVE METABOLIC PANEL
ALT: 23 U/L (ref 0–44)
AST: 25 U/L (ref 15–41)
Albumin: 3.4 g/dL — ABNORMAL LOW (ref 3.5–5.0)
Alkaline Phosphatase: 55 U/L (ref 38–126)
Anion gap: 8 (ref 5–15)
BUN: 12 mg/dL (ref 6–20)
CO2: 23 mmol/L (ref 22–32)
Calcium: 8.7 mg/dL — ABNORMAL LOW (ref 8.9–10.3)
Chloride: 103 mmol/L (ref 98–111)
Creatinine, Ser: 1.33 mg/dL — ABNORMAL HIGH (ref 0.61–1.24)
GFR, Estimated: >60 mL/min (ref >60)
Glucose, Bld: 142 mg/dL — ABNORMAL HIGH (ref 70–99)
Potassium: 4.2 mmol/L (ref 3.5–5.1)
Sodium: 134 mmol/L — ABNORMAL LOW (ref 135–145)
Total Bilirubin: 0.7 mg/dL (ref 0.3–1.2)
Total Protein: 5.6 g/dL — ABNORMAL LOW (ref 6.5–8.1)

## 2021-09-07 LAB — ECHOCARDIOGRAM COMPLETE
AR max vel: 1.92 cm2
AV Area VTI: 1.64 cm2
AV Area mean vel: 1.76 cm2
AV Mean grad: 6 mmHg
AV Peak grad: 9.7 mmHg
Ao pk vel: 1.56 m/s
Calc EF: 25.6 %
Height: 68 in
MV M vel: 4.93 m/s
MV Peak grad: 97.2 mmHg
Radius: 0.95 cm
S' Lateral: 5.7 cm
Single Plane A2C EF: 28.3 %
Single Plane A4C EF: 28 %
Weight: 3120 oz

## 2021-09-07 LAB — CBC WITH DIFFERENTIAL/PLATELET
Abs Immature Granulocytes: 0.03 10*3/uL (ref 0.00–0.07)
Basophils Absolute: 0.1 10*3/uL (ref 0.0–0.1)
Basophils Relative: 1 %
Eosinophils Absolute: 0.5 10*3/uL (ref 0.0–0.5)
Eosinophils Relative: 5 %
HCT: 39.9 % (ref 39.0–52.0)
Hemoglobin: 13 g/dL (ref 13.0–17.0)
Immature Granulocytes: 0 %
Lymphocytes Relative: 22 %
Lymphs Abs: 2.4 10*3/uL (ref 0.7–4.0)
MCH: 27.4 pg (ref 26.0–34.0)
MCHC: 32.6 g/dL (ref 30.0–36.0)
MCV: 84 fL (ref 80.0–100.0)
Monocytes Absolute: 0.9 10*3/uL (ref 0.1–1.0)
Monocytes Relative: 8 %
Neutro Abs: 6.7 10*3/uL (ref 1.7–7.7)
Neutrophils Relative %: 64 %
Platelets: 337 10*3/uL (ref 150–400)
RBC: 4.75 MIL/uL (ref 4.22–5.81)
RDW: 14 % (ref 11.5–15.5)
WBC: 10.6 10*3/uL — ABNORMAL HIGH (ref 4.0–10.5)
nRBC: 0 % (ref 0.0–0.2)

## 2021-09-07 LAB — BRAIN NATRIURETIC PEPTIDE: B Natriuretic Peptide: 889.6 pg/mL — ABNORMAL HIGH (ref 0.0–100.0)

## 2021-09-07 LAB — URINALYSIS, ROUTINE W REFLEX MICROSCOPIC
Bilirubin Urine: NEGATIVE
Glucose, UA: NEGATIVE mg/dL
Hgb urine dipstick: NEGATIVE
Ketones, ur: NEGATIVE mg/dL
Leukocytes,Ua: NEGATIVE
Nitrite: NEGATIVE
Protein, ur: NEGATIVE mg/dL
Specific Gravity, Urine: 1.018 (ref 1.005–1.030)
pH: 6 (ref 5.0–8.0)

## 2021-09-07 LAB — TROPONIN I (HIGH SENSITIVITY)
Troponin I (High Sensitivity): 21 ng/L — ABNORMAL HIGH (ref <18)
Troponin I (High Sensitivity): 22 ng/L — ABNORMAL HIGH (ref <18)

## 2021-09-07 LAB — RESP PANEL BY RT-PCR (FLU A&B, COVID) ARPGX2
Influenza A by PCR: NEGATIVE
Influenza B by PCR: NEGATIVE
SARS Coronavirus 2 by RT PCR: NEGATIVE

## 2021-09-07 LAB — T4, FREE: Free T4: 0.87 ng/dL (ref 0.61–1.12)

## 2021-09-07 LAB — GLUCOSE, CAPILLARY: Glucose-Capillary: 173 mg/dL — ABNORMAL HIGH (ref 70–99)

## 2021-09-07 LAB — TSH: TSH: 1.361 u[IU]/mL (ref 0.350–4.500)

## 2021-09-07 LAB — MAGNESIUM: Magnesium: 1.8 mg/dL (ref 1.7–2.4)

## 2021-09-07 MED ORDER — NITROGLYCERIN 0.4 MG SL SUBL: 0.4000 mg | SUBLINGUAL_TABLET | SUBLINGUAL | Status: DC | PRN

## 2021-09-07 MED ORDER — PANTOPRAZOLE SODIUM 40 MG PO TBEC
40.0000 mg | DELAYED_RELEASE_TABLET | Freq: Every day | ORAL | Status: DC
  Administered 2021-09-08 – 2021-09-11 (×4): 40 mg via ORAL
  Filled 2021-09-07 (×4): qty 1

## 2021-09-07 MED ORDER — POTASSIUM CHLORIDE CRYS ER 20 MEQ PO TBCR
20.0000 meq | EXTENDED_RELEASE_TABLET | Freq: Every day | ORAL | Status: DC
  Administered 2021-09-07 – 2021-09-10 (×4): 20 meq via ORAL
  Filled 2021-09-07 (×5): qty 1

## 2021-09-07 MED ORDER — INSULIN ASPART 100 UNIT/ML IJ SOLN
0.0000 [IU] | Freq: Three times a day (TID) | INTRAMUSCULAR | Status: DC
  Administered 2021-09-08: 3 [IU] via SUBCUTANEOUS
  Administered 2021-09-09 (×2): 1 [IU] via SUBCUTANEOUS

## 2021-09-07 MED ORDER — SODIUM CHLORIDE 0.9 % IV BOLUS
1000.0000 mL | Freq: Once | INTRAVENOUS | Status: AC
  Administered 2021-09-07: 1000 mL via INTRAVENOUS

## 2021-09-07 MED ORDER — AMIODARONE HCL IN DEXTROSE 360-4.14 MG/200ML-% IV SOLN: 30.0000 mg/h | INTRAVENOUS | Status: DC

## 2021-09-07 MED ORDER — ENOXAPARIN SODIUM 40 MG/0.4ML IJ SOSY
40.0000 mg | PREFILLED_SYRINGE | INTRAMUSCULAR | Status: DC
  Administered 2021-09-07 – 2021-09-09 (×3): 40 mg via SUBCUTANEOUS
  Filled 2021-09-07 (×3): qty 0.4

## 2021-09-07 MED ORDER — DIGOXIN 125 MCG PO TABS
0.1250 mg | ORAL_TABLET | Freq: Every day | ORAL | Status: DC
  Administered 2021-09-07 – 2021-09-11 (×5): 0.125 mg via ORAL
  Filled 2021-09-07 (×5): qty 1

## 2021-09-07 MED ORDER — FUROSEMIDE 10 MG/ML IJ SOLN
40.0000 mg | Freq: Two times a day (BID) | INTRAMUSCULAR | Status: DC
Start: 2021-09-07 — End: 2021-09-08
  Administered 2021-09-07 – 2021-09-08 (×2): 40 mg via INTRAVENOUS
  Filled 2021-09-07 (×2): qty 4

## 2021-09-07 MED ORDER — AMIODARONE HCL IN DEXTROSE 360-4.14 MG/200ML-% IV SOLN
60.0000 mg/h | INTRAVENOUS | Status: AC
  Administered 2021-09-07 (×2): 60 mg/h via INTRAVENOUS
  Filled 2021-09-07 (×2): qty 200

## 2021-09-07 MED ORDER — ASCORBIC ACID 500 MG PO TABS
1000.0000 mg | ORAL_TABLET | Freq: Every day | ORAL | Status: DC
  Administered 2021-09-09 – 2021-09-11 (×3): 1000 mg via ORAL
  Filled 2021-09-07 (×3): qty 2

## 2021-09-07 MED ORDER — ZOLPIDEM TARTRATE 5 MG PO TABS: 5.0000 mg | ORAL_TABLET | Freq: Every evening | ORAL | Status: DC | PRN

## 2021-09-07 MED ORDER — ADULT MULTIVITAMIN W/MINERALS CH
1.0000 | ORAL_TABLET | Freq: Every day | ORAL | Status: DC
  Administered 2021-09-08 – 2021-09-11 (×4): 1 via ORAL
  Filled 2021-09-07 (×4): qty 1

## 2021-09-07 MED ORDER — SPIRONOLACTONE 12.5 MG HALF TABLET
12.5000 mg | ORAL_TABLET | Freq: Every day | ORAL | Status: DC
  Administered 2021-09-07 – 2021-09-10 (×4): 12.5 mg via ORAL
  Filled 2021-09-07 (×5): qty 1

## 2021-09-07 MED ORDER — AMIODARONE HCL IN DEXTROSE 360-4.14 MG/200ML-% IV SOLN: 60.0000 mg/h | INTRAVENOUS | Status: DC

## 2021-09-07 MED ORDER — ASPIRIN 325 MG PO TABS
325.0000 mg | ORAL_TABLET | Freq: Once | ORAL | Status: AC
  Administered 2021-09-07: 325 mg via ORAL
  Filled 2021-09-07: qty 1

## 2021-09-07 MED ORDER — IOHEXOL 350 MG/ML SOLN
50.0000 mL | Freq: Once | INTRAVENOUS | Status: AC | PRN
  Administered 2021-09-07: 50 mL via INTRAVENOUS

## 2021-09-07 MED ORDER — MAGNESIUM OXIDE -MG SUPPLEMENT 400 (240 MG) MG PO TABS
200.0000 mg | ORAL_TABLET | Freq: Every day | ORAL | Status: DC
  Administered 2021-09-07 – 2021-09-11 (×5): 200 mg via ORAL
  Filled 2021-09-07 (×5): qty 1

## 2021-09-07 MED ORDER — FUROSEMIDE 10 MG/ML IJ SOLN
80.0000 mg | Freq: Once | INTRAMUSCULAR | Status: AC
  Administered 2021-09-07: 80 mg via INTRAVENOUS
  Filled 2021-09-07: qty 8

## 2021-09-07 MED ORDER — VITAMIN B-12 1000 MCG PO TABS
1000.0000 ug | ORAL_TABLET | Freq: Every day | ORAL | Status: DC
  Administered 2021-09-08 – 2021-09-11 (×4): 1000 ug via ORAL
  Filled 2021-09-07 (×4): qty 1

## 2021-09-07 MED ORDER — ONDANSETRON HCL 4 MG/2ML IJ SOLN: 4.0000 mg | Freq: Four times a day (QID) | INTRAMUSCULAR | Status: DC | PRN

## 2021-09-07 MED ORDER — DULOXETINE HCL 60 MG PO CPEP
60.0000 mg | ORAL_CAPSULE | Freq: Every day | ORAL | Status: DC
  Administered 2021-09-08 – 2021-09-11 (×4): 60 mg via ORAL
  Filled 2021-09-07 (×4): qty 1

## 2021-09-07 MED ORDER — ACETAMINOPHEN 325 MG PO TABS: 650.0000 mg | ORAL_TABLET | ORAL | Status: DC | PRN

## 2021-09-07 MED ORDER — AMIODARONE LOAD VIA INFUSION
150.0000 mg | Freq: Once | INTRAVENOUS | Status: DC
  Filled 2021-09-07: qty 83.34

## 2021-09-07 MED ORDER — AMIODARONE HCL IN DEXTROSE 360-4.14 MG/200ML-% IV SOLN
60.0000 mg/h | INTRAVENOUS | Status: DC
  Administered 2021-09-08: 60 mg/h via INTRAVENOUS
  Filled 2021-09-07 (×2): qty 200

## 2021-09-07 MED ORDER — ALPRAZOLAM 0.25 MG PO TABS: 0.2500 mg | ORAL_TABLET | Freq: Two times a day (BID) | ORAL | Status: DC | PRN

## 2021-09-07 MED ORDER — FUROSEMIDE 10 MG/ML IJ SOLN: 20.0000 mg | Freq: Once | INTRAMUSCULAR | Status: DC

## 2021-09-07 MED ORDER — TAMSULOSIN HCL 0.4 MG PO CAPS
0.4000 mg | ORAL_CAPSULE | Freq: Every day | ORAL | Status: DC
  Administered 2021-09-07 – 2021-09-10 (×4): 0.4 mg via ORAL
  Filled 2021-09-07 (×4): qty 1

## 2021-09-07 NOTE — H&P (Addendum)
Cardiology History and Physical:   Patient ID: Ruben Duffy MRN: [id number]; DOB: May 25, 1966  Admit date: 09/07/2021 Date of Consult: 09/07/2021  PCP:  Bonnita Hollow, MD   [facility] Providers Cardiologist:  Quay Burow, MD        Patient Profile:   Ruben Duffy is a 55 y.o. male with a hx of HTN, LBBB, GERD, prostate CA, emphysema, remote subs abuse, who is being seen 09/07/2021 for the evaluation of CHF at the request of Dr Pearline Cables.  History of Present Illness:   Ruben Duffy was seen by Dr Gwenlyn Found 05/09/2021 for preop eval before radioactive seed implant and circumcision. He had LBBB of unknown duration, BP well-controlled. No further workup pre-procedure. 11/08 phone message, pt c/o SOB, requested he go to the ER.   Pt came to ER today for worsening SOB, cards asked to evaluate. CTA c/w CHF.  Ruben Duffy has been getting gradually more SOB w/ exertion since he had 65 radiation seeds implanted on 06/05/2021.  The SOB has progressed to the point that he is SOB at rest. He has had PND at times. He denies LE edema. He has had some tingling in his toes. He has orthopnea. He also describes fatigue and lack of energy.  He will occasionally feel a little wheezy. He quit tobacco 2009, quit drugs > 20 years ago.   He can feel a pause in his HR when he takes his pulse, the pauses make him more SOB. He does not have palpitations.   He does not get chest pain.   He was told he had emphysema and multiple lung nodules >> for recheck after the first of the year.  He reports some urinary problems, including burning.    Past Medical History:  Diagnosis Date   Arthritis    Emphysema of lung ([place])    GERD (gastroesophageal reflux disease)    Hypertension    Hypospadias, balanic    chronic   LBBB (left bundle branch block)    newly dx by ekg done 04-26-2021 for surgery on 06-05-2021,  pt stated he not had a previous ekg before, so pt sent for cardiac clearance prior to surgery;   cardiologist , dr berry, office note clearance in epic 05-09-2021 gave pt clearance with no further work-up needed   Lower urinary tract symptoms (LUTS)    Prostate cancer [facility])    urologist-- dr Jeffie Pollock--- dx by bx 01-08-2021, Gleason 3+4, T2a   Substance abuse [facility])     Past Surgical History:  Procedure Laterality Date   BIOPSY PROSTATE     CIRCUMCISION N/A 06/05/2021   Procedure: CIRCUMCISION ADULT;  Surgeon: Irine Seal, MD;  Location: [facility];  Service: Urology;  Laterality: N/A;   HEMORRHOID SURGERY     HYPOSPADIAS CORRECTION     RADIOACTIVE SEED IMPLANT N/A 06/05/2021   Procedure: RADIOACTIVE SEED IMPLANT/BRACHYTHERAPY IMPLANT;  Surgeon: Irine Seal, MD;  Location: [facility];  Service: Urology;  Laterality: N/A;   SPACE OAR INSTILLATION N/A 06/05/2021   Procedure: SPACE OAR INSTILLATION;  Surgeon: Irine Seal, MD;  Location[facility];  Service: Urology;  Laterality: N/A;     Home Medications:  Prior to Admission medications   Medication Sig Start Date End Date Taking? Authorizing Provider  Ascorbic Acid (VITAMIN C) 1000 MG tablet Take 1,000 mg by mouth daily.   Yes [provider]  Cholecalciferol (VITAMIN D3) 1.25 MG (50000 UT) CAPS Take by mouth.   Yes [provider]  DULoxetine (CYMBALTA) 60 MG capsule Take 60 mg by mouth daily. 12/25/20  Yes [provider]  ibuprofen (ADVIL) 600 MG tablet Take 600 mg by mouth daily as needed for headache or mild pain. 05/08/21  Yes [provider]  lisinopril-hydrochlorothiazide (ZESTORETIC) 20-12.5 MG tablet Take 1 tablet by mouth daily. 11/28/20  Yes [provider]  meloxicam (MOBIC) 15 MG tablet Take 1 tablet by mouth daily. 01/16/21  Yes [provider]  Multiple Vitamin (MULTIVITAMIN ADULT PO) Take by mouth.   Yes [provider]  omeprazole (PRILOSEC) 40 MG capsule Take 1 capsule by mouth daily. 12/01/20  Yes [provider]  sildenafil (REVATIO) 20 MG tablet Take 20 mg by mouth as needed (ED). 12/01/20  Yes [provider]  tamsulosin (FLOMAX) 0.4 MG CAPS capsule Take 1 capsule (0.4 mg total) by mouth daily after supper. 04/26/21  Yes Bruning, Ashlyn, PA-C  valACYclovir (VALTREX) 500 MG tablet Take 500 mg by mouth 2 (two) times daily as needed. 01/18/21  Yes [provider]  vitamin B-12 (CYANOCOBALAMIN) 1000 MCG tablet Take 1,000 mcg by mouth daily.   Yes [provider]  triamcinolone (KENALOG) 0.025 % ointment Apply topically daily as needed (itching, rash). 09/22/20   [provider]    Inpatient Medications: Scheduled Meds:   Continuous Infusions:  amiodarone     amiodarone     PRN Meds:   Allergies:   No Known Allergies  Social History:   Social History   Socioeconomic History   Marital status: Single    Spouse name: Not on file   Number of children: 1   Years of education: Not on file   Highest education level: Not on file  Occupational History   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  Tobacco Use   Smoking status: Former    Packs/day: 1.50    Years: 30.00    Pack years: 45.00    Types: Cigarettes    Quit date: 2009    Years since quitting: 13.9   Smokeless tobacco: Never  Vaping Use   Vaping Use: Never used  Substance and Sexual Activity   Alcohol use: Not Currently   Drug use: Not Currently    Types: Marijuana, "Crack" cocaine   Sexual activity: Yes  Other Topics Concern   Not on file  Social History Narrative   Not on file   Social Determinants of Health   Financial Resource Strain: Low Risk    Difficulty of Paying Living Expenses: Not hard at all  Food Insecurity: No Food Insecurity   Worried About Charity fundraiser in the Last Year: Never true   [place] in the Last Year: Never true  Transportation Needs: No Transportation Needs   Lack of Transportation (Medical): No   Lack of  Transportation (Non-Medical): No  Physical Activity: Inactive   Days of Exercise per Week: 0 days   Minutes of Exercise per Session: 0 min  Stress: No Stress Concern Present   Feeling of Stress : Not at all  Social Connections: Socially Isolated   Frequency of Communication with Friends and Family: Three times a week   Frequency of Social Gatherings with Friends and Family: Three times a week   Attends Religious Services: Never   Active Member of Clubs or Organizations: No   Attends Archivist Meetings: Never   Marital Status: Never married  Intimate Partner Violence: Not At Risk  Fear of Current or Ex-Partner: No   Emotionally Abused: No   Physically Abused: No   Sexually Abused: No    Family History:   Family History  Problem Relation Age of Onset   Stroke Father    Lung cancer Maternal Aunt    Diabetes Maternal Uncle    Brain cancer Maternal Uncle    Lung cancer Maternal Uncle    Lung cancer Maternal Uncle    Cancer Paternal Grandmother    Diabetes Paternal Grandfather    Colon cancer Neg Hx    Esophageal cancer Neg Hx    Stomach cancer Neg Hx    Pancreatic cancer Neg Hx    Colon polyps Neg Hx    Rectal cancer Neg Hx      ROS:  Please see the history of present illness.  All other ROS reviewed and negative.     Physical Exam/Data:   Vitals:   09/07/21 1530 09/07/21 1600 09/07/21 1630 09/07/21 1700  BP: 94/76 105/84 103/83 107/78  Pulse: 96 97 (!) 101 65  Resp: 17 (!) 31 (!) 27 (!) 29  Temp:      TempSrc:      SpO2: 97% 95% 97% 96%  Weight:      Height:        Intake/Output Summary (Last 24 hours) at 09/07/2021 1704 Last data filed at 09/07/2021 1438 Gross per 24 hour  Intake 1000 ml  Output --  Net 1000 ml   Last 3 Weights 09/07/2021 08/15/2021 08/02/2021  Weight (lbs) 195 lb 191 lb 190 lb  Weight (kg) 88.451 kg 86.637 kg 86.183 kg     Body mass index is 29.65 kg/m.  General:  Well nourished, well developed, in acute respiratory  distress HEENT: normal Neck: JVD 10 cm Vascular: No carotid bruits; Distal pulses 2+ bilaterally Cardiac:  normal S1, S2; RRR; no murmur  Lungs: rales to auscultation bilaterally, no wheezing, rhonchi   Abd: soft, nontender, no hepatomegaly  Ext: trace edema Musculoskeletal:  No deformities, BUE and BLE strength normal and equal Skin: warm and dry  Neuro:  CNs 2-12 intact, no focal abnormalities noted Psych:  Normal affect   EKG:  The EKG was personally reviewed and demonstrates:  ST, HR 103, LBBB is old, PVCs and bigeminy at times Telemetry:  Telemetry was personally reviewed and demonstrates:  SR, frequent PVCs and pairs, bigeminy at times  Relevant CV Studies:  None  Laboratory Data:  High Sensitivity Troponin:   Recent Labs  Lab 09/07/21 1309  TROPONINIHS 21*     Chemistry Recent Labs  Lab 09/07/21 1309  NA 134*  K 4.2  CL 103  CO2 23  GLUCOSE 142*  BUN 12  CREATININE 1.33*  CALCIUM 8.7*  MG 1.8  GFRNONAA >60  ANIONGAP 8    Recent Labs  Lab 09/07/21 1309  PROT 5.6*  ALBUMIN 3.4*  AST 25  ALT 23  ALKPHOS 55  BILITOT 0.7   Lipids No results for input(s): CHOL, TRIG, HDL, LABVLDL, LDLCALC, CHOLHDL in the last 168 hours.  Hematology Recent Labs  Lab 09/07/21 1309  WBC 10.6*  RBC 4.75  HGB 13.0  HCT 39.9  MCV 84.0  MCH 27.4  MCHC 32.6  RDW 14.0  PLT 337   Thyroid  Recent Labs  Lab 09/07/21 1309  TSH 1.361  FREET4 0.87    BNP Recent Labs  Lab 09/07/21 1309  BNP 889.6*    DDimer No results for input(s): DDIMER in the last  168 hours.   Radiology/Studies:  CT Angio Chest PE W and/or Wo Contrast  Result Date: 09/07/2021 CLINICAL DATA:  Shortness of breath. Pulmonary embolism suspected, low/intermediate probability. EXAM: CT ANGIOGRAPHY CHEST WITH CONTRAST TECHNIQUE: Multidetector CT imaging of the chest was performed using the standard protocol during bolus administration of intravenous contrast. Multiplanar CT image reconstructions  and MIPs were obtained to evaluate the vascular anatomy. CONTRAST:  109mL OMNIPAQUE IOHEXOL 350 MG/ML SOLN COMPARISON:  Noncontrast chest CT 08/08/2021 FINDINGS: Cardiovascular: The pulmonary arteries are well opacified with contrast to the level of the subsegmental branches. There is no evidence of acute pulmonary embolism. Limited opacification of the aorta, without evidence of systemic arterial abnormality. The heart is mildly enlarged. No significant pericardial fluid. Mediastinum/Nodes: Small mediastinal and hilar lymph nodes are similar to previous study. There is no axillary adenopathy. The thyroid gland, trachea and esophagus demonstrate no significant findings. Lungs/Pleura: New small dependent right-greater-than-left pleural effusions with associated mild dependent atelectasis in both lungs. There is increased interstitial prominence throughout the lungs which likely represents edema. Underlying central airway thickening, centrilobular and paraseptal emphysema noted. There are scattered small pulmonary nodules,, grossly unchanged from the recent study, measuring up to 6 mm in the right lower lobe on image 66/7. Upper abdomen: Mild reflux of contrast into the IVC and hepatic veins. The visualized upper abdomen otherwise appears unremarkable. Musculoskeletal/Chest wall: There is no chest wall mass or suspicious osseous finding. There are degenerative changes throughout the spine and both shoulders. Review of the MIP images confirms the above findings. IMPRESSION: 1. No evidence of acute pulmonary embolism or other acute vascular findings in the chest. 2. Increased interstitial thickening in both lungs associated with small bilateral pleural effusions, cardiomegaly and reflux of contrast into the IVC and hepatic veins, most consistent with congestive heart failure. Recommend chest radiographic follow-up. 3. Underlying emphysema with central airway thickening. Emphysema (ICD10-J43.9). 4. Grossly unchanged small  pulmonary nodules compared with recent chest CT of 1 month ago. This interval follow-up is too short to assess these nodules. Please refer to previous follow-up recommendations. Electronically Signed   By: Richardean Sale M.D.   On: 09/07/2021 15:26     Assessment and Plan:   1  SOB, acute  CHF - pt in acute CHF, type unclear - stat echo to help guide treatment - watch for CGS, BP is borderline - give Lasix 80 mg IV and follow response - will not add BB at this time w/ acute CHF and borderline BP - mild trop elevation, but no calcification of coronary arteries noted on CT report  2. PVCs -  frequent PVCs, couplets, triplets    - bigeminy at times - he is symptomatic with these, makes him more SOB. - start amio w/ no bolus given lower BP   follow response - K+ 4.2, Mg 1.8, will supp Mg  3. Acute kidney injury - Cr 08/19 was 0.97, now 1.33 - hold Mobic, ibuprofen - hold lisinopril/HCTZ Lasix x 1     Risk Assessment/Risk Scores:       New York Heart Association (NYHA) Functional Class NYHA Class IV  For questions or updates, please contact [facility] Please consult [email or web address] for contact info under    Signed, Rosaria Ferries, PA-C  09/07/2021 5:04 PM   Ruben Duffy seen and examined  Pt is a gentleman with non known hx of CAD or CHF    Has felt preogressively weak and SOB since late summer, worse over last couple weeks Presents to  [facility] ED On exam  BP 100s/   Tele wit hSR and freqent PVCs, couplets , triplets   (patient more uncomfortable when having ectopy) Neck:  JVP is increased Cardiac RRR  NO S3  no signficant murmurs  Lungs with decreased breath sounds at bases Abd is sl distended    Nontender   Ext with tr edema CXR  consistent with pulmonary edema    Impression  CHF   Unclear etiology   Pt with frequent PVCs which may be contributing  Wil lgive lasix x 1 80 mg   Follow response Start amiodarone IV to help limit PVCs, increase perfusion/output Stat echo    eval LVEF /RVEF in this setting since BP is marginal Admit to stepdown with close monitoring   Dorris Carnes MF

## 2021-09-07 NOTE — ED Provider Notes (Addendum)
[facility] EMERGENCY DEPARTMENT Provider Note   CSN: [id number] Arrival date & time: 09/07/21  1127     History Chief Complaint  Patient presents with   Shortness of Breath    Ruben Duffy is a 55 y.o. male.  This is a 55 y.o. male with significant medical history as below, including prostate cancer s/p seedng who presents to the ED with complaint of dib.  Patient reports that since the procedure in August with radiation seed implantation he has been having intermittent difficulty breathing.  Patient feels this is worsened recently.  Difficulty catching his breath, significantly difficult breathing with ambulation.  Palpitations, diaphoresis, tachycardic, tachypneic.  Mild chest pain.  Pain to chest is worse on deep inspiration.   He was seen by cardiologist around 3 months ago prior to urologic procedure  Compliance with home medications, no home oxygen use.  The history is provided by the patient and a relative. No language interpreter was used.  Shortness of Breath Associated symptoms: chest pain and diaphoresis   Associated symptoms: no abdominal pain, no cough, no fever, no headaches, no rash and no vomiting       Past Medical History:  Diagnosis Date   Arthritis    Emphysema of lung ([place])    GERD (gastroesophageal reflux disease)    Hypertension    Hypospadias, balanic    chronic   LBBB (left bundle branch block)    newly dx by ekg done 04-26-2021 for surgery on 06-05-2021,  pt stated he not had a previous ekg before, so pt sent for cardiac clearance prior to surgery;  cardiologist , dr berry, office note clearance in epic 05-09-2021 gave pt clearance with no further work-up needed   Lower urinary tract symptoms (LUTS)    Prostate cancer [facility])    urologist-- dr Jeffie Pollock--- dx by bx 01-08-2021, Gleason 3+4, T2a   Substance abuse [facility])     Patient Active Problem List   Diagnosis Date Noted   New onset of congestive heart failure ([place]) 09/07/2021    Left bundle branch block 05/09/2021   Essential hypertension 05/09/2021   Preoperative clearance 05/09/2021   Malignant neoplasm of prostate ([place]) 02/23/2021    Past Surgical History:  Procedure Laterality Date   BIOPSY PROSTATE     CIRCUMCISION N/A 06/05/2021   Procedure: CIRCUMCISION ADULT;  Surgeon: Irine Seal, MD;  Location: [facility];  Service: Urology;  Laterality: N/A;   HEMORRHOID SURGERY     HYPOSPADIAS CORRECTION     RADIOACTIVE SEED IMPLANT N/A 06/05/2021   Procedure: RADIOACTIVE SEED IMPLANT/BRACHYTHERAPY IMPLANT;  Surgeon: Irine Seal, MD;  Location[facility];  Service: Urology;  Laterality: N/A;   SPACE OAR INSTILLATION N/A 06/05/2021   Procedure: SPACE OAR INSTILLATION;  Surgeon: Irine Seal, MD;  Location[facility];  Service: Urology;  Laterality: N/A;       Family History  Problem Relation Age of Onset   Stroke Father    Lung cancer Maternal Aunt    Diabetes Maternal Uncle    Brain cancer Maternal Uncle    Lung cancer Maternal Uncle    Lung cancer Maternal Uncle    Cancer Paternal Grandmother    Diabetes Paternal Grandfather    Colon cancer Neg Hx    Esophageal cancer Neg Hx    Stomach cancer Neg Hx    Pancreatic cancer Neg Hx    Colon polyps Neg Hx    Rectal cancer Neg Hx  Social History   Tobacco Use   Smoking status: Former    Packs/day: 1.50    Years: 30.00    Pack years: 45.00    Types: Cigarettes    Quit date: 2009    Years since quitting: 13.9   Smokeless tobacco: Never  Vaping Use   Vaping Use: Never used  Substance Use Topics   Alcohol use: Not Currently   Drug use: Not Currently    Types: Marijuana, "Crack" cocaine    Home Medications Prior to Admission medications   Medication Sig Start Date End Date Taking? Authorizing Provider  Ascorbic Acid (VITAMIN C) 1000 MG tablet Take 1,000 mg by mouth daily.   Yes [provider]  Cholecalciferol (VITAMIN D3) 1.25 MG (50000  UT) CAPS Take by mouth.   Yes [provider]  DULoxetine (CYMBALTA) 60 MG capsule Take 60 mg by mouth daily. 12/25/20  Yes [provider]  ibuprofen (ADVIL) 600 MG tablet Take 600 mg by mouth daily as needed for headache or mild pain. 05/08/21  Yes [provider]  lisinopril-hydrochlorothiazide (ZESTORETIC) 20-12.5 MG tablet Take 1 tablet by mouth daily. 11/28/20  Yes [provider]  meloxicam (MOBIC) 15 MG tablet Take 1 tablet by mouth daily. 01/16/21  Yes [provider]  Multiple Vitamin (MULTIVITAMIN ADULT PO) Take by mouth.   Yes [provider]  omeprazole (PRILOSEC) 40 MG capsule Take 1 capsule by mouth daily. 12/01/20  Yes [provider]  sildenafil (REVATIO) 20 MG tablet Take 20 mg by mouth as needed (ED). 12/01/20  Yes [provider]  tamsulosin (FLOMAX) 0.4 MG CAPS capsule Take 1 capsule (0.4 mg total) by mouth daily after supper. 04/26/21  Yes Bruning, Ashlyn, PA-C  valACYclovir (VALTREX) 500 MG tablet Take 500 mg by mouth 2 (two) times daily as needed. 01/18/21  Yes [provider]  vitamin B-12 (CYANOCOBALAMIN) 1000 MCG tablet Take 1,000 mcg by mouth daily.   Yes [provider]  triamcinolone (KENALOG) 0.025 % ointment Apply topically daily as needed (itching, rash). 09/22/20   [provider]    Allergies    Patient has no known allergies.  Review of Systems   Review of Systems  Constitutional:  Positive for diaphoresis. Negative for chills and fever.  HENT:  Negative for facial swelling and trouble swallowing.   Eyes:  Negative for photophobia and visual disturbance.  Respiratory:  Positive for chest tightness and shortness of breath. Negative for cough.   Cardiovascular:  Positive for chest pain and palpitations.  Gastrointestinal:  Negative for abdominal pain, nausea and vomiting.  Endocrine: Negative for polydipsia and polyuria.  Genitourinary:  Negative for difficulty  urinating and hematuria.  Musculoskeletal:  Negative for gait problem and joint swelling.  Skin:  Negative for pallor and rash.  Neurological:  Negative for syncope and headaches.  Psychiatric/Behavioral:  Negative for agitation and confusion.    Physical Exam Updated Vital Signs BP 105/84   Pulse 97   Temp 97.8 F (36.6 C) (Oral)   Resp (!) 31   Ht 5\' 8"  (1.727 m)   Wt 88.5 kg   SpO2 95%   BMI 29.65 kg/m   Physical Exam Vitals and nursing note reviewed.  Constitutional:      General: He is not in acute distress.    Appearance: He is well-developed. He is ill-appearing and diaphoretic.  HENT:     Head: Normocephalic and atraumatic.     Right Ear: External ear normal.  Left Ear: External ear normal.     Mouth/Throat:     Mouth: Mucous membranes are moist.  Eyes:     General: No scleral icterus. Cardiovascular:     Rate and Rhythm: Regular rhythm. Tachycardia present.     Pulses: Normal pulses.     Heart sounds: Normal heart sounds.  Pulmonary:     Effort: Pulmonary effort is normal. Tachypnea present. No respiratory distress.     Breath sounds: Normal breath sounds.  Abdominal:     General: Abdomen is flat.     Palpations: Abdomen is soft.     Tenderness: There is no abdominal tenderness.  Musculoskeletal:        General: Normal range of motion.     Cervical back: Normal range of motion.     Right lower leg: No edema.     Left lower leg: No edema.  Skin:    General: Skin is warm.     Capillary Refill: Capillary refill takes less than 2 seconds.  Neurological:     Mental Status: He is alert and oriented to person, place, and time.     GCS: GCS eye subscore is 4. GCS verbal subscore is 5. GCS motor subscore is 6.  Psychiatric:        Mood and Affect: Mood normal.        Behavior: Behavior normal.    ED Results / Procedures / Treatments   Labs (all labs ordered are listed, but only abnormal results are displayed) Labs Reviewed  CBC WITH  DIFFERENTIAL/PLATELET - Abnormal; Notable for the following components:      Result Value   WBC 10.6 (*)    All other components within normal limits  BRAIN NATRIURETIC PEPTIDE - Abnormal; Notable for the following components:   B Natriuretic Peptide 889.6 (*)    All other components within normal limits  COMPREHENSIVE METABOLIC PANEL - Abnormal; Notable for the following components:   Sodium 134 (*)    Glucose, Bld 142 (*)    Creatinine, Ser 1.33 (*)    Calcium 8.7 (*)    Total Protein 5.6 (*)    Albumin 3.4 (*)    All other components within normal limits  TROPONIN I (HIGH SENSITIVITY) - Abnormal; Notable for the following components:   Troponin I (High Sensitivity) 21 (*)    All other components within normal limits  RESP PANEL BY RT-PCR (FLU A&B, COVID) ARPGX2  MAGNESIUM  TSH  T4, FREE  URINALYSIS, ROUTINE W REFLEX MICROSCOPIC  TROPONIN I (HIGH SENSITIVITY)    EKG EKG Interpretation  Date/Time:  Friday September 07 2021 11:39:42 EST Ventricular Rate:  103 PR Interval:  170 QRS Duration: 148 QT Interval:  380 QTC Calculation: 497 R Axis:   144 Text Interpretation: Sinus tachycardia with frequent and consecutive Premature ventricular complexes Right axis deviation Left ventricular hypertrophy with QRS widening and repolarization abnormality ( Cornell product ) LBBB Similar to prior tracing Confirmed by Wynona Dove ([id number]) on 09/07/2021 1:18:32 PM  Radiology CT Angio Chest PE W and/or Wo Contrast  Result Date: 09/07/2021 CLINICAL DATA:  Shortness of breath. Pulmonary embolism suspected, low/intermediate probability. EXAM: CT ANGIOGRAPHY CHEST WITH CONTRAST TECHNIQUE: Multidetector CT imaging of the chest was performed using the standard protocol during bolus administration of intravenous contrast. Multiplanar CT image reconstructions and MIPs were obtained to evaluate the vascular anatomy. CONTRAST:  81mL OMNIPAQUE IOHEXOL 350 MG/ML SOLN COMPARISON:  Noncontrast chest CT  08/08/2021 FINDINGS: Cardiovascular: The pulmonary arteries are well opacified  with contrast to the level of the subsegmental branches. There is no evidence of acute pulmonary embolism. Limited opacification of the aorta, without evidence of systemic arterial abnormality. The heart is mildly enlarged. No significant pericardial fluid. Mediastinum/Nodes: Small mediastinal and hilar lymph nodes are similar to previous study. There is no axillary adenopathy. The thyroid gland, trachea and esophagus demonstrate no significant findings. Lungs/Pleura: New small dependent right-greater-than-left pleural effusions with associated mild dependent atelectasis in both lungs. There is increased interstitial prominence throughout the lungs which likely represents edema. Underlying central airway thickening, centrilobular and paraseptal emphysema noted. There are scattered small pulmonary nodules,, grossly unchanged from the recent study, measuring up to 6 mm in the right lower lobe on image 66/7. Upper abdomen: Mild reflux of contrast into the IVC and hepatic veins. The visualized upper abdomen otherwise appears unremarkable. Musculoskeletal/Chest wall: There is no chest wall mass or suspicious osseous finding. There are degenerative changes throughout the spine and both shoulders. Review of the MIP images confirms the above findings. IMPRESSION: 1. No evidence of acute pulmonary embolism or other acute vascular findings in the chest. 2. Increased interstitial thickening in both lungs associated with small bilateral pleural effusions, cardiomegaly and reflux of contrast into the IVC and hepatic veins, most consistent with congestive heart failure. Recommend chest radiographic follow-up. 3. Underlying emphysema with central airway thickening. Emphysema (ICD10-J43.9). 4. Grossly unchanged small pulmonary nodules compared with recent chest CT of 1 month ago. This interval follow-up is too short to assess these nodules. Please refer  to previous follow-up recommendations. Electronically Signed   By: Richardean Sale M.D.   On: 09/07/2021 15:26    Procedures .Critical Care Performed by: Jeanell Sparrow, DO Authorized by: Jeanell Sparrow, DO   Critical care provider statement:    Critical care time (minutes):  32   Critical care time was exclusive of:  Separately billable procedures and treating other patients   Critical care was necessary to treat or prevent imminent or life-threatening deterioration of the following conditions:  Cardiac failure and respiratory failure   Critical care was time spent personally by me on the following activities:  Development of treatment plan with patient or surrogate, discussions with consultants, evaluation of patient's response to treatment, examination of patient, ordering and review of laboratory studies, ordering and review of radiographic studies, ordering and performing treatments and interventions, pulse oximetry, re-evaluation of patient's condition and review of old charts   Care discussed with: admitting provider     Medications Ordered in ED Medications  furosemide (LASIX) injection 80 mg (has no administration in time range)  amiodarone (NEXTERONE PREMIX) 360-4.14 MG/200ML-% (1.8 mg/mL) IV infusion (has no administration in time range)  amiodarone (NEXTERONE PREMIX) 360-4.14 MG/200ML-% (1.8 mg/mL) IV infusion (has no administration in time range)  sodium chloride 0.9 % bolus 1,000 mL (0 mLs Intravenous Stopped 09/07/21 1438)  iohexol (OMNIPAQUE) 350 MG/ML injection 50 mL (50 mLs Intravenous Contrast Given 09/07/21 1512)  aspirin tablet 325 mg (325 mg Oral Given 09/07/21 1612)    ED Course  I have reviewed the triage vital signs and the nursing notes.  Pertinent labs & imaging results that were available during my care of the patient were reviewed by me and considered in my medical decision making (see chart for details).    MDM Rules/Calculators/A&P                             CC: cp, dib,  palpitations  This patient complains of above; this involves an extensive number of treatment options and is a complaint that carries with it a high risk of complications and morbidity. Vital signs were reviewed. Serious etiologies considered.  Record review:  Previous records obtained and reviewed   Additional history obtained from mother  Work up as above, notable for:  Labs & imaging results that were available during my care of the patient were reviewed by me and considered in my medical decision making.   I ordered imaging studies which included CT PE and I independently visualized and interpreted imaging which showed chf  Management: Patient history of prostate cancer, tachycardia, palpitations, dyspnea, pleuritic chest pain.  High suspicion for PE.  Will obtain CT PE angio.  Patient mildly hypoxic on room air 92%, conversational dyspnea.  Will place on nasal cannula 2 L.  Pulse ox on nasal cannula is 98 to 99%.  Reassessment:  Pt feeling better upon re-assessment. Still requiring supplemental oxygen. CT is concerning for new onset CHF, he has mild  troponin leak (given ASA, favor demand ischemia 2/2 tachycardia, hypoxia, chf), mild hypoxia, BNP nearly 900. Will plan for admission and cardiology evaluation. No prior echo. Discussed with cardio team who will see pt in consult.   Recommend admission for new onset CHF, NSTEMI, hypoxia. Pt agreeable. Initially discussed w/ Dr Lorin Mercy but received notification by cardiology that they will admit as primary service.        This chart was dictated using voice recognition software.  Despite best efforts to proofread,  errors can occur which can change the documentation meaning.  Final Clinical Impression(s) / ED Diagnoses Final diagnoses:  AKI (acute kidney injury) ([place])  NSTEMI (non-ST elevated myocardial infarction) ([place])  Acute congestive heart failure, unspecified heart failure type [facility])  Hypoxia    Rx / DC  Orders ED Discharge Orders     None        Jeanell Sparrow, DO 09/07/21 [place], Velma, DO 09/07/21 1656

## 2021-09-07 NOTE — Consult Note (Signed)
Advanced Heart Failure Team Consult Note   Primary Physician: Bonnita Hollow, MD PCP-Cardiologist:  Quay Burow, MD  Reason for Consultation: Acute systolic HF  HPI:    Ruben Duffy is seen today for evaluation of acute systolic HF at the request of Dr. Harrington Challenger.   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 He had LBBB of unknown duration, BP well-controlled. No further workup pre-procedure.  Presented to ED today with progressive SOB and LE edema since prostate seed implant 8/23/2. Chest CT no PE. + CHF with edema and bilateral effusions. + COPD . No mention of coronary calcium  11/08 phone message, pt c/o SOB, requested he go to the ER.    Pt came to ER today for worsening SOB, cards asked to evaluate. CTA c/w CHF.   Ruben Duffy has been getting gradually more SOB w/ exertion since he had 65 radiation seeds implanted on 06/05/2021.   In ED  BNP 889. Hstrop 21 -> 22  ECG with sinus tach LBBB and frequent PVCs   Echo EF 20-25%. LV dilated (6.5cm) with severe central Ruben. Global HK with ? AK of anteroseptum   He is SOB with minimal exertion. + orthopnea and PND. + skipping heartbeats. No CP. + edema   Has responded well to IV lasix.   Review of Systems: [y] = yes, [ ]  = no   General: Weight gain [ y]; Weight loss [ ] ; Anorexia [ ] ; Fatigue [ y]; Fever [ ] ; Chills [ ] ; Weakness [ ]   Cardiac: Chest pain/pressure [ ] ; Resting SOB Blue.Reese ]; Exertional SOB Blue.Reese ]; Orthopnea [ y]; Pedal Edema Blue.Reese ]; Palpitations [ y]; Syncope [ ] ; Presyncope [ ] ; Paroxysmal nocturnal dyspnea[y ]  Pulmonary: Cough [ ] ; Wheezing[ ] ; Hemoptysis[ ] ; Sputum [ ] ; Snoring [ ]   GI: Vomiting[ ] ; Dysphagia[ ] ; Melena[ ] ; Hematochezia [ ] ; Heartburn[ ] ; Abdominal pain [ ] ; Constipation [ ] ; Diarrhea [ ] ; BRBPR [ ]   GU:  Hematuria[ ] ; Dysuria [ y]; Nocturia[ ]   Vascular: Pain in legs with walking [ ] ; Pain in feet with lying flat [ ] ; Non-healing sores [ ] ; Stroke [ ] ; TIA [ ] ; Slurred speech [ ] ;  Neuro: Headaches[ ] ; Vertigo[ ] ; Seizures[ ] ; Paresthesias[ ] ;Blurred vision [ ] ; Diplopia [ ] ; Vision changes [ ]   Ortho/Skin: Arthritis Blue.Reese ]; Joint pain [ y]; Muscle pain [ ] ; Joint swelling [ ] ; Back Pain [ ] ; Rash [ ]   Psych: Depression[ ] ; Anxiety[ ]   Heme: Bleeding problems [ ] ; Clotting disorders [ ] ; Anemia [ ]   Endocrine: Diabetes [ ] ; Thyroid dysfunction[ ]   Home Medications Prior to Admission medications   Medication Sig Start Date End Date Taking? Authorizing Provider  Ascorbic Acid (VITAMIN C) 1000 MG tablet Take 1,000 mg by mouth daily.   Yes [provider]  Cholecalciferol (VITAMIN D3) 1.25 MG (50000 UT) CAPS Take by mouth.   Yes [provider]  DULoxetine (CYMBALTA) 60 MG capsule Take 60 mg by mouth daily. 12/25/20  Yes [provider]  ibuprofen (ADVIL) 600 MG tablet Take 600 mg by mouth daily as needed for headache or mild pain. 05/08/21  Yes [provider]  lisinopril-hydrochlorothiazide (ZESTORETIC) 20-12.5 MG tablet Take 1 tablet by mouth daily. 11/28/20  Yes [provider]  meloxicam (MOBIC) 15 MG tablet Take 1 tablet by mouth daily. 01/16/21  Yes [provider]  Multiple Vitamin (MULTIVITAMIN ADULT PO) Take by mouth.   Yes [provider]  omeprazole (PRILOSEC) 40 MG capsule Take 1 capsule by mouth daily. 12/01/20  Yes [provider]  sildenafil (REVATIO) 20 MG tablet Take 20 mg by mouth as needed (ED). 12/01/20  Yes [provider]  tamsulosin (FLOMAX) 0.4 MG CAPS capsule Take 1 capsule (0.4 mg total) by mouth daily after supper. 04/26/21  Yes Bruning, Ashlyn, PA-C  valACYclovir (VALTREX) 500 MG tablet Take 500 mg by mouth 2 (two) times daily as needed. 01/18/21  Yes [provider]  vitamin B-12  (CYANOCOBALAMIN) 1000 MCG tablet Take 1,000 mcg by mouth daily.   Yes [provider]  triamcinolone (KENALOG) 0.025 % ointment Apply topically daily as needed (itching, rash). 09/22/20   [provider]    Past Medical History: Past Medical History:  Diagnosis Date   Arthritis    Emphysema of lung ([place])    GERD (gastroesophageal reflux disease)    Hypertension    Hypospadias, balanic    chronic   LBBB (left bundle branch block)    newly dx by ekg done 04-26-2021 for surgery on 06-05-2021,  pt stated he not had a previous ekg before, so pt sent for cardiac clearance prior to surgery;  cardiologist , dr berry, office note clearance in epic 05-09-2021 gave pt clearance with no further work-up needed   Lower urinary tract symptoms (LUTS)    Prostate cancer [facility])    urologist-- dr Jeffie Pollock--- dx by bx 01-08-2021, Gleason 3+4, T2a   Substance abuse ([place])     Past Surgical History: Past Surgical History:  Procedure Laterality Date   BIOPSY PROSTATE     CIRCUMCISION N/A 06/05/2021   Procedure: CIRCUMCISION ADULT;  Surgeon: Irine Seal, MD;  Location: [facility];  Service: Urology;  Laterality: N/A;   HEMORRHOID SURGERY     HYPOSPADIAS CORRECTION     RADIOACTIVE SEED IMPLANT N/A 06/05/2021   Procedure: RADIOACTIVE SEED IMPLANT/BRACHYTHERAPY IMPLANT;  Surgeon: Irine Seal, MD;  Location: [facility];  Service: Urology;  Laterality: N/A;   SPACE OAR INSTILLATION N/A 06/05/2021   Procedure: SPACE OAR INSTILLATION;  Surgeon: Irine Seal, MD;  Location: [facility];  Service: Urology;  Laterality: N/A;    Family History: Family History  Problem Relation Age of Onset   Stroke Father    Lung cancer Maternal Aunt    Diabetes Maternal Uncle    Brain cancer Maternal Uncle    Lung cancer Maternal Uncle    Lung cancer Maternal Uncle    Cancer Paternal Grandmother    Diabetes Paternal Grandfather    Colon cancer Neg Hx    Esophageal  cancer Neg Hx    Stomach cancer Neg Hx    Pancreatic cancer Neg Hx    Colon polyps Neg Hx    Rectal cancer Neg Hx     Social History: Social History   Socioeconomic History   Marital status: Single    Spouse name: Not on file   Number of children: 1   Years of education: Not on file   Highest education level: Not on file  Occupational History   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  Tobacco Use   Smoking status: Former    Packs/day: 1.50    Years: 30.00    Pack years: 45.00  Types: Cigarettes    Quit date: 2009    Years since quitting: 13.9   Smokeless tobacco: Never  Vaping Use   Vaping Use: Never used  Substance and Sexual Activity   Alcohol use: Not Currently   Drug use: Not Currently    Types: Marijuana, "Crack" cocaine   Sexual activity: Yes  Other Topics Concern   Not on file  Social History Narrative   Not on file   Social Determinants of Health   Financial Resource Strain: Low Risk    Difficulty of Paying Living Expenses: Not hard at all  Food Insecurity: No Food Insecurity   Worried About Charity fundraiser in the Last Year: Never true   [place] in the Last Year: Never true  Transportation Needs: No Transportation Needs   Lack of Transportation (Medical): No   Lack of Transportation (Non-Medical): No  Physical Activity: Inactive   Days of Exercise per Week: 0 days   Minutes of Exercise per Session: 0 min  Stress: No Stress Concern Present   Feeling of Stress : Not at all  Social Connections: Socially Isolated   Frequency of Communication with Friends and Family: Three times a week   Frequency of Social Gatherings with Friends and Family: Three times a week   Attends Religious Services: Never   Active Member of Clubs or Organizations: No   Attends Music therapist: Never   Marital Status: Never married    Allergies:  No Known Allergies  Objective:    Vital Signs:   Temp:  [97.5 F (36.4  C)-97.8 F (36.6 C)] 97.5 F (36.4 C) (11/25 1821) Pulse Rate:  [48-123] 108 (11/25 1821) Resp:  [17-32] 20 (11/25 1821) BP: (91-118)/(71-92) 101/78 (11/25 1821) SpO2:  [92 %-99 %] 92 % (11/25 1821) Weight:  [88.5 kg-89.1 kg] 89.1 kg (11/25 1821)    Weight change: Filed Weights   09/07/21 1140 09/07/21 1821  Weight: 88.5 kg 89.1 kg    Intake/Output:   Intake/Output Summary (Last 24 hours) at 09/07/2021 1832 Last data filed at 09/07/2021 1800 Gross per 24 hour  Intake 1000 ml  Output 300 ml  Net 700 ml      Physical Exam    General:  Sitting up on side of bed No resp difficulty HEENT: normal Neck: supple. JVP 10  . Carotids 2+ bilat; no bruits. No lymphadenopathy or thyromegaly appreciated. Cor: PMI nondisplaced. Tachy irregular 2/6 Ruben Lungs: decreased throughout Abdomen: soft, nontender, nondistended. No hepatosplenomegaly. No bruits or masses. Good bowel sounds. Extremities: no cyanosis, clubbing, rash, 1-2+ edema Neuro: alert & orientedx3, cranial nerves grossly intact. moves all 4 extremities w/o difficulty. Affect pleasant   Telemetry   Sinus 90-110 40-50 PVCs per minute Personally reviewed   EKG    Sinus tach 103 LBBB 143ms frequent PVCs (2 morphologies) Personally reviewed   Labs   Basic Metabolic Panel: Recent Labs  Lab 09/07/21 1309  NA 134*  K 4.2  CL 103  CO2 23  GLUCOSE 142*  BUN 12  CREATININE 1.33*  CALCIUM 8.7*  MG 1.8    Liver Function Tests: Recent Labs  Lab 09/07/21 1309  AST 25  ALT 23  ALKPHOS 55  BILITOT 0.7  PROT 5.6*  ALBUMIN 3.4*   No results for input(s): LIPASE, AMYLASE in the last 168 hours. No results for input(s): AMMONIA in the last 168 hours.  CBC: Recent Labs  Lab 09/07/21 1309  WBC 10.6*  NEUTROABS 6.7  HGB  13.0  HCT 39.9  MCV 84.0  PLT 337    Cardiac Enzymes: No results for input(s): CKTOTAL, CKMB, CKMBINDEX, TROPONINI in the last 168 hours.  BNP: BNP (last 3 results) Recent Labs     09/07/21 1309  BNP 889.6*    ProBNP (last 3 results) No results for input(s): PROBNP in the last 8760 hours.   CBG: No results for input(s): GLUCAP in the last 168 hours.  Coagulation Studies: No results for input(s): LABPROT, INR in the last 72 hours.   Imaging   CT Angio Chest PE W and/or Wo Contrast  Result Date: 09/07/2021 CLINICAL DATA:  Shortness of breath. Pulmonary embolism suspected, low/intermediate probability. EXAM: CT ANGIOGRAPHY CHEST WITH CONTRAST TECHNIQUE: Multidetector CT imaging of the chest was performed using the standard protocol during bolus administration of intravenous contrast. Multiplanar CT image reconstructions and MIPs were obtained to evaluate the vascular anatomy. CONTRAST:  67mL OMNIPAQUE IOHEXOL 350 MG/ML SOLN COMPARISON:  Noncontrast chest CT 08/08/2021 FINDINGS: Cardiovascular: The pulmonary arteries are well opacified with contrast to the level of the subsegmental branches. There is no evidence of acute pulmonary embolism. Limited opacification of the aorta, without evidence of systemic arterial abnormality. The heart is mildly enlarged. No significant pericardial fluid. Mediastinum/Nodes: Small mediastinal and hilar lymph nodes are similar to previous study. There is no axillary adenopathy. The thyroid gland, trachea and esophagus demonstrate no significant findings. Lungs/Pleura: New small dependent right-greater-than-left pleural effusions with associated mild dependent atelectasis in both lungs. There is increased interstitial prominence throughout the lungs which likely represents edema. Underlying central airway thickening, centrilobular and paraseptal emphysema noted. There are scattered small pulmonary nodules,, grossly unchanged from the recent study, measuring up to 6 mm in the right lower lobe on image 66/7. Upper abdomen: Mild reflux of contrast into the IVC and hepatic veins. The visualized upper abdomen otherwise appears unremarkable.  Musculoskeletal/Chest wall: There is no chest wall mass or suspicious osseous finding. There are degenerative changes throughout the spine and both shoulders. Review of the MIP images confirms the above findings. IMPRESSION: 1. No evidence of acute pulmonary embolism or other acute vascular findings in the chest. 2. Increased interstitial thickening in both lungs associated with small bilateral pleural effusions, cardiomegaly and reflux of contrast into the IVC and hepatic veins, most consistent with congestive heart failure. Recommend chest radiographic follow-up. 3. Underlying emphysema with central airway thickening. Emphysema (ICD10-J43.9). 4. Grossly unchanged small pulmonary nodules compared with recent chest CT of 1 month ago. This interval follow-up is too short to assess these nodules. Please refer to previous follow-up recommendations. Electronically Signed   By: Richardean Sale M.D.   On: 09/07/2021 15:26     Medications:     Current Medications:  [START ON 09/08/2021] vitamin C  1,000 mg Oral Daily   digoxin  0.125 mg Oral Daily   [START ON 09/08/2021] DULoxetine  60 mg Oral Daily   enoxaparin (LOVENOX) injection  40 mg Subcutaneous Q24H   furosemide  40 mg Intravenous Q12H   magnesium oxide  200 mg Oral Daily   [START ON 09/08/2021] multivitamin with minerals  1 tablet Oral Daily   [START ON 09/08/2021] pantoprazole  40 mg Oral Daily   potassium chloride  20 mEq Oral Daily   spironolactone  12.5 mg Oral Daily   tamsulosin  0.4 mg Oral QPC supper   [START ON 09/08/2021] vitamin B-12  1,000 mcg Oral Daily    Infusions:  amiodarone 60 mg/hr (09/07/21 1722)   amiodarone  Assessment/Plan   1. Acute systolic HF - Echo EF 03-21%. LV dilated (6.5cm) with severe central Ruben. Global HK with ? 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 Doubt low output at  this point.  - Start dig and spiro - Plan R/L cath Monday. If cath negative plan MRI - Will need LifeVest at d/c +/- event CRT-D  2. Frequent PVCs - on telel 40-50 PVCs/minute - start amio - keep K> 4.0 Mg > 2.0  3. Severe mitral regurgitation - functional - should improve with treaement of HF  4. COPD - quit smoking 2009  5. Borderline DM2 - check HgBa1c - cover with SSI  6. H/o prostate CA - s/p radioactive seeds  7. Hypomag - supp    Length of Stay: 0  Glori Bickers, MD  09/07/2021, 6:32 PM  Advanced Heart Failure Team Pager [phone] (M-F; 7a - 5p)  Please contact [facility] for night-coverage after hours (4p -7a ) and weekends on [email or web address]

## 2021-09-07 NOTE — Progress Notes (Signed)
  Echocardiogram 2D Echocardiogram has been performed.  Ruben Duffy 09/07/2021, 5:43 PM

## 2021-09-07 NOTE — ED Triage Notes (Signed)
Pt reports worsening sob since having a radiation seed implanted in august. Resp unlabored in triage, pt able to talk in complete sentences

## 2021-09-08 LAB — HIV ANTIBODY (ROUTINE TESTING W REFLEX): HIV Screen 4th Generation wRfx: NONREACTIVE

## 2021-09-08 LAB — BASIC METABOLIC PANEL
Anion gap: 9 (ref 5–15)
BUN: 12 mg/dL (ref 6–20)
CO2: 27 mmol/L (ref 22–32)
Calcium: 9.4 mg/dL (ref 8.9–10.3)
Chloride: 100 mmol/L (ref 98–111)
Creatinine, Ser: 1.2 mg/dL (ref 0.61–1.24)
GFR, Estimated: >60 mL/min (ref >60)
Glucose, Bld: 122 mg/dL — ABNORMAL HIGH (ref 70–99)
Potassium: 4 mmol/L (ref 3.5–5.1)
Sodium: 136 mmol/L (ref 135–145)

## 2021-09-08 LAB — GLUCOSE, CAPILLARY
Glucose-Capillary: 202 mg/dL — ABNORMAL HIGH (ref 70–99)
Glucose-Capillary: 97 mg/dL (ref 70–99)
Glucose-Capillary: 108 mg/dL — ABNORMAL HIGH (ref 70–99)
Glucose-Capillary: 134 mg/dL — ABNORMAL HIGH (ref 70–99)

## 2021-09-08 MED ORDER — SACUBITRIL-VALSARTAN 24-26 MG PO TABS
1.0000 | ORAL_TABLET | Freq: Two times a day (BID) | ORAL | Status: DC
  Administered 2021-09-08 – 2021-09-10 (×5): 1 via ORAL
  Filled 2021-09-08 (×5): qty 1

## 2021-09-08 MED ORDER — AMIODARONE HCL 200 MG PO TABS
200.0000 mg | ORAL_TABLET | Freq: Two times a day (BID) | ORAL | Status: DC
  Administered 2021-09-08 – 2021-09-11 (×7): 200 mg via ORAL
  Filled 2021-09-08 (×7): qty 1

## 2021-09-08 MED ORDER — FUROSEMIDE 10 MG/ML IJ SOLN
40.0000 mg | Freq: Two times a day (BID) | INTRAMUSCULAR | Status: AC
  Administered 2021-09-08: 40 mg via INTRAVENOUS
  Filled 2021-09-08: qty 4

## 2021-09-08 NOTE — Progress Notes (Addendum)
Patient ID: Ruben Duffy, male   DOB: 28-Jan-1966, 55 y.o.   MRN: [id number]     Advanced Heart Failure Rounding Note  PCP-Cardiologist: Quay Burow, MD   Subjective:    Breathing is getting better.    I/Os net negative 2125 on Lasix 40 IV bid. Weight down.    Objective:   Weight Range: 85.5 kg Body mass index is 28.68 kg/m.   Vital Signs:   Temp:  [97.5 F (36.4 C)-97.8 F (36.6 C)] 97.7 F (36.5 C) (11/26 0805) Pulse Rate:  [48-123] 91 (11/26 1010) Resp:  [17-32] 17 (11/26 0521) BP: (91-118)/(69-92) 107/69 (11/26 0805) SpO2:  [92 %-99 %] 96 % (11/26 1010) Weight:  [85.5 kg-89.1 kg] 85.5 kg (11/26 0521)    Weight change: Filed Weights   09/07/21 1140 09/07/21 1821 09/08/21 0521  Weight: 88.5 kg 89.1 kg 85.5 kg    Intake/Output:   Intake/Output Summary (Last 24 hours) at 09/08/2021 1044 Last data filed at 09/08/2021 1000 Gross per 24 hour  Intake 1000 ml  Output 3325 ml  Net -2325 ml      Physical Exam    General:  Well appearing. No resp difficulty HEENT: Normal Neck: Supple. JVP 8-9 cm. Carotids 2+ bilat; no bruits. No lymphadenopathy or thyromegaly appreciated. Cor: PMI nondisplaced. Regular rate & rhythm. 1/6 HSM apex.  Lungs: Clear Abdomen: Soft, nontender, nondistended. No hepatosplenomegaly. No bruits or masses. Good bowel sounds. Extremities: No cyanosis, clubbing, rash, edema Neuro: Alert & orientedx3, cranial nerves grossly intact. moves all 4 extremities w/o difficulty. Affect pleasant   Telemetry   NSR with rare PVCs (personally reviewed).   Labs    CBC Recent Labs    09/07/21 1309  WBC 10.6*  NEUTROABS 6.7  HGB 13.0  HCT 39.9  MCV 84.0  PLT 007   Basic Metabolic Panel Recent Labs    09/07/21 1309 09/08/21 0301  NA 134* 136  K 4.2 4.0  CL 103 100  CO2 23 27  GLUCOSE 142* 122*  BUN 12 12  CREATININE 1.33* 1.20  CALCIUM 8.7* 9.4  MG 1.8  --    Liver Function Tests Recent Labs    09/07/21 1309  AST 25  ALT 23   ALKPHOS 55  BILITOT 0.7  PROT 5.6*  ALBUMIN 3.4*   No results for input(s): LIPASE, AMYLASE in the last 72 hours. Cardiac Enzymes No results for input(s): CKTOTAL, CKMB, CKMBINDEX, TROPONINI in the last 72 hours.  BNP: BNP (last 3 results) Recent Labs    09/07/21 1309  BNP 889.6*    ProBNP (last 3 results) No results for input(s): PROBNP in the last 8760 hours.   D-Dimer No results for input(s): DDIMER in the last 72 hours. Hemoglobin A1C No results for input(s): HGBA1C in the last 72 hours. Fasting Lipid Panel No results for input(s): CHOL, HDL, LDLCALC, TRIG, CHOLHDL, LDLDIRECT in the last 72 hours. Thyroid Function Tests Recent Labs    09/07/21 1309  TSH 1.361    Other results:   Imaging    CT Angio Chest PE W and/or Wo Contrast  Result Date: 09/07/2021 CLINICAL DATA:  Shortness of breath. Pulmonary embolism suspected, low/intermediate probability. EXAM: CT ANGIOGRAPHY CHEST WITH CONTRAST TECHNIQUE: Multidetector CT imaging of the chest was performed using the standard protocol during bolus administration of intravenous contrast. Multiplanar CT image reconstructions and MIPs were obtained to evaluate the vascular anatomy. CONTRAST:  25mL OMNIPAQUE IOHEXOL 350 MG/ML SOLN COMPARISON:  Noncontrast chest CT 08/08/2021 FINDINGS: Cardiovascular: The pulmonary  arteries are well opacified with contrast to the level of the subsegmental branches. There is no evidence of acute pulmonary embolism. Limited opacification of the aorta, without evidence of systemic arterial abnormality. The heart is mildly enlarged. No significant pericardial fluid. Mediastinum/Nodes: Small mediastinal and hilar lymph nodes are similar to previous study. There is no axillary adenopathy. The thyroid gland, trachea and esophagus demonstrate no significant findings. Lungs/Pleura: New small dependent right-greater-than-left pleural effusions with associated mild dependent atelectasis in both lungs. There  is increased interstitial prominence throughout the lungs which likely represents edema. Underlying central airway thickening, centrilobular and paraseptal emphysema noted. There are scattered small pulmonary nodules,, grossly unchanged from the recent study, measuring up to 6 mm in the right lower lobe on image 66/7. Upper abdomen: Mild reflux of contrast into the IVC and hepatic veins. The visualized upper abdomen otherwise appears unremarkable. Musculoskeletal/Chest wall: There is no chest wall mass or suspicious osseous finding. There are degenerative changes throughout the spine and both shoulders. Review of the MIP images confirms the above findings. IMPRESSION: 1. No evidence of acute pulmonary embolism or other acute vascular findings in the chest. 2. Increased interstitial thickening in both lungs associated with small bilateral pleural effusions, cardiomegaly and reflux of contrast into the IVC and hepatic veins, most consistent with congestive heart failure. Recommend chest radiographic follow-up. 3. Underlying emphysema with central airway thickening. Emphysema (ICD10-J43.9). 4. Grossly unchanged small pulmonary nodules compared with recent chest CT of 1 month ago. This interval follow-up is too short to assess these nodules. Please refer to previous follow-up recommendations. Electronically Signed   By: Richardean Sale M.D.   On: 09/07/2021 15:26   ECHOCARDIOGRAM COMPLETE  Result Date: 09/07/2021    ECHOCARDIOGRAM REPORT   Patient Name:   [facility] Date of Exam: 09/07/2021 Medical Rec #:  [id number]    Height:       68.0 in Accession #:    [id number]   Weight:       195.0 lb Date of Birth:  1966-06-07    BSA:          2.022 m Patient Age:    72 years     BP:           107/78 mmHg Patient Gender: M            HR:           98 bpm. Exam Location:  Inpatient Procedure: 2D Echo, Cardiac Doppler and Color Doppler STAT ECHO Indications:    Dyspnea  History:        Patient has no prior history of  Echocardiogram examinations.                 Arrythmias:Tachycardia and LBBB; Signs/Symptoms:Shortness of                 Breath.  Sonographer:    Merrie Roof RDCS Referring Phys: [place]  1. Diffuse hypokinesis worse in the inferior wall . Left ventricular ejection fraction, by estimation, is 25 to 30%. The left ventricle has severely decreased function. The left ventricle demonstrates global hypokinesis. The left ventricular internal cavity size was moderately dilated. Left ventricular diastolic parameters were normal.  2. Right ventricular systolic function is normal. The right ventricular size is normal.  3. Left atrial size was moderately dilated.  4. ? ischemic MR and dilated annulus contributing to mechanism MR. The mitral valve is abnormal. Moderate to severe mitral valve regurgitation. No evidence of  mitral stenosis.  5. The aortic valve is tricuspid. Aortic valve regurgitation is not visualized. No aortic stenosis is present.  6. The inferior vena cava is dilated in size with <50% respiratory variability, suggesting right atrial pressure of 15 mmHg. FINDINGS  Left Ventricle: Diffuse hypokinesis worse in the inferior wall. Left ventricular ejection fraction, by estimation, is 25 to 30%. The left ventricle has severely decreased function. The left ventricle demonstrates global hypokinesis. The left ventricular  internal cavity size was moderately dilated. There is no left ventricular hypertrophy. Left ventricular diastolic parameters were normal. Right Ventricle: The right ventricular size is normal. No increase in right ventricular wall thickness. Right ventricular systolic function is normal. Left Atrium: Left atrial size was moderately dilated. Right Atrium: Right atrial size was normal in size. Pericardium: There is no evidence of pericardial effusion. Mitral Valve: ? ischemic MR and dilated annulus contributing to mechanism MR. The mitral valve is abnormal. Moderate to severe  mitral valve regurgitation. No evidence of mitral valve stenosis. Tricuspid Valve: The tricuspid valve is normal in structure. Tricuspid valve regurgitation is mild . No evidence of tricuspid stenosis. Aortic Valve: The aortic valve is tricuspid. Aortic valve regurgitation is not visualized. No aortic stenosis is present. Aortic valve mean gradient measures 6.0 mmHg. Aortic valve peak gradient measures 9.7 mmHg. Aortic valve area, by VTI measures 1.64 cm. Pulmonic Valve: The pulmonic valve was normal in structure. Pulmonic valve regurgitation is not visualized. No evidence of pulmonic stenosis. Aorta: The aortic root is normal in size and structure. Venous: The inferior vena cava is dilated in size with less than 50% respiratory variability, suggesting right atrial pressure of 15 mmHg. IAS/Shunts: No atrial level shunt detected by color flow Doppler.  LEFT VENTRICLE PLAX 2D LVIDd:         6.50 cm LVIDs:         5.70 cm LV PW:         1.10 cm LV IVS:        0.80 cm LVOT diam:     2.20 cm LV SV:         37 LV SV Index:   18 LVOT Area:     3.80 cm  LV Volumes (MOD) LV vol d, MOD A2C: 293.0 ml LV vol d, MOD A4C: 279.0 ml LV vol s, MOD A2C: 210.0 ml LV vol s, MOD A4C: 201.0 ml LV SV MOD A2C:     83.0 ml LV SV MOD A4C:     279.0 ml LV SV MOD BP:      74.0 ml RIGHT VENTRICLE             IVC RV Basal diam:  4.60 cm     IVC diam: 2.30 cm RV Mid diam:    4.10 cm RV S prime:     11.70 cm/s TAPSE (M-mode): 1.3 cm LEFT ATRIUM              Index        RIGHT ATRIUM           Index LA diam:        4.60 cm  2.28 cm/m   RA Area:     23.90 cm LA Vol (A2C):   110.0 ml 54.41 ml/m  RA Volume:   81.90 ml  40.51 ml/m LA Vol (A4C):   123.0 ml 60.83 ml/m LA Biplane Vol: 120.0 ml 59.35 ml/m  AORTIC VALVE AV Area (Vmax):    1.92 cm AV Area (Vmean):  1.76 cm AV Area (VTI):     1.64 cm AV Vmax:           156.00 cm/s AV Vmean:          112.000 cm/s AV VTI:            0.228 m AV Peak Grad:      9.7 mmHg AV Mean Grad:      6.0 mmHg  LVOT Vmax:         78.80 cm/s LVOT Vmean:        52.000 cm/s LVOT VTI:          0.098 m LVOT/AV VTI ratio: 0.43  AORTA Ao Root diam: 3.10 cm Ao Asc diam:  3.00 cm MR Peak grad:    97.2 mmHg MR Mean grad:    60.0 mmHg    SHUNTS MR Vmax:         493.00 cm/s  Systemic VTI:  0.10 m MR Vmean:        370.0 cm/s   Systemic Diam: 2.20 cm MR PISA:         5.67 cm MR PISA Eff ROA: 41 mm MR PISA Radius:  0.95 cm Jenkins Rouge MD Electronically signed by Jenkins Rouge MD Signature Date/Time: 09/07/2021/7:57:50 PM    Final      Medications:     Scheduled Medications:  vitamin C  1,000 mg Oral Daily   digoxin  0.125 mg Oral Daily   DULoxetine  60 mg Oral Daily   enoxaparin (LOVENOX) injection  40 mg Subcutaneous Q24H   furosemide  40 mg Intravenous BID   insulin aspart  0-9 Units Subcutaneous TID WC   magnesium oxide  200 mg Oral Daily   multivitamin with minerals  1 tablet Oral Daily   pantoprazole  40 mg Oral Daily   potassium chloride  20 mEq Oral Daily   sacubitril-valsartan  1 tablet Oral BID   spironolactone  12.5 mg Oral Daily   tamsulosin  0.4 mg Oral QPC supper   vitamin B-12  1,000 mcg Oral Daily    Infusions:  amiodarone 60 mg/hr (09/08/21 0709)    PRN Medications: acetaminophen, ALPRAZolam, nitroGLYCERIN, ondansetron (ZOFRAN) IV, zolpidem    Assessment/Plan   1. Acute systolic HF - Echo EF 92-42%. LV dilated (6.5cm) with severe central MR. Global HK with ? 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  Weight down, seems only mildly volume overloaded at this point.  - Continue digoxin and spironolactone.  - Add Entresto 24/26 bid.  - Stop IV Lasix after pm dose, reassess diuretic need tomorrow.  - Plan R/L cath Monday. If cath negative plan MRI - Will need LifeVest at d/c +/- eventual CRT-D   2. Frequent PVCs - on telel 40-50 PVCs/minute - started amiodarone gtt, now with occasional PVCs.  -  Can transition amiodarone to po.  - keep K> 4.0 Mg > 2.0   3. Severe mitral regurgitation - functional - should improve with treatment of HF and possibly CRT   4. COPD - quit smoking 2009   5. Borderline DM2 - check HgA1c - cover with SSI   6. H/o prostate CA - s/p radioactive seeds   7. Hypomag - supp   Length of Stay: [place], MD  09/08/2021, 10:44 AM  Advanced Heart Failure Team Pager [phone] (M-F; 7a - 5p)  Please contact [facility] for night-coverage after hours (5p -7a ) and weekends on [email or web address]

## 2021-09-09 LAB — GLUCOSE, CAPILLARY
Glucose-Capillary: 139 mg/dL — ABNORMAL HIGH (ref 70–99)
Glucose-Capillary: 95 mg/dL (ref 70–99)
Glucose-Capillary: 138 mg/dL — ABNORMAL HIGH (ref 70–99)
Glucose-Capillary: 138 mg/dL — ABNORMAL HIGH (ref 70–99)
Glucose-Capillary: 120 mg/dL — ABNORMAL HIGH (ref 70–99)

## 2021-09-09 LAB — BASIC METABOLIC PANEL
Anion gap: 8 (ref 5–15)
BUN: 16 mg/dL (ref 6–20)
CO2: 30 mmol/L (ref 22–32)
Calcium: 9.4 mg/dL (ref 8.9–10.3)
Chloride: 97 mmol/L — ABNORMAL LOW (ref 98–111)
Creatinine, Ser: 1.24 mg/dL (ref 0.61–1.24)
GFR, Estimated: >60 mL/min (ref >60)
Glucose, Bld: 127 mg/dL — ABNORMAL HIGH (ref 70–99)
Potassium: 4.3 mmol/L (ref 3.5–5.1)
Sodium: 135 mmol/L (ref 135–145)

## 2021-09-09 MED ORDER — SODIUM CHLORIDE 0.9% FLUSH
3.0000 mL | Freq: Two times a day (BID) | INTRAVENOUS | Status: DC
  Administered 2021-09-09: 21:00:00 3 mL via INTRAVENOUS

## 2021-09-09 MED ORDER — SODIUM CHLORIDE 0.9 % IV SOLN: 250.0000 mL | INTRAVENOUS | Status: DC | PRN

## 2021-09-09 MED ORDER — DAPAGLIFLOZIN PROPANEDIOL 10 MG PO TABS
10.0000 mg | ORAL_TABLET | Freq: Every day | ORAL | Status: DC
  Administered 2021-09-09 – 2021-09-11 (×3): 10 mg via ORAL
  Filled 2021-09-09 (×3): qty 1

## 2021-09-09 MED ORDER — SODIUM CHLORIDE 0.9% FLUSH: 3.0000 mL | INTRAVENOUS | Status: DC | PRN

## 2021-09-09 MED ORDER — ASPIRIN 81 MG PO CHEW
81.0000 mg | CHEWABLE_TABLET | ORAL | Status: AC
  Administered 2021-09-10: 06:00:00 81 mg via ORAL
  Filled 2021-09-09: qty 1

## 2021-09-09 MED ORDER — VALACYCLOVIR HCL 500 MG PO TABS
500.0000 mg | ORAL_TABLET | Freq: Two times a day (BID) | ORAL | Status: DC
  Administered 2021-09-09 – 2021-09-11 (×5): 500 mg via ORAL
  Filled 2021-09-09 (×6): qty 1

## 2021-09-09 MED ORDER — SODIUM CHLORIDE 0.9 % IV SOLN: INTRAVENOUS | Status: DC

## 2021-09-09 NOTE — Progress Notes (Signed)
Patient ID: Ruben Duffy, male   DOB: Jan 16, 1966, 55 y.o.   MRN: [id number]     Advanced Heart Failure Rounding Note  PCP-Cardiologist: Quay Burow, MD   Subjective:    Breathing much improved, I/Os negative and weight down with Lasix 40 mg IV bid yesterday.   Objective:   Weight Range: 84.8 kg Body mass index is 28.42 kg/m.   Vital Signs:   Temp:  [97.7 F (36.5 C)-98.2 F (36.8 C)] 97.7 F (36.5 C) (11/27 0610) Pulse Rate:  [76-98] 88 (11/27 0847) Resp:  [16-19] 16 (11/27 0610) BP: (100-106)/(65-79) 101/65 (11/27 0610) SpO2:  [92 %-96 %] 93 % (11/27 0610) Weight:  [84.8 kg] 84.8 kg (11/27 0610)    Weight change: Filed Weights   09/07/21 1821 09/08/21 0521 09/09/21 0610  Weight: 89.1 kg 85.5 kg 84.8 kg    Intake/Output:   Intake/Output Summary (Last 24 hours) at 09/09/2021 0951 Last data filed at 09/09/2021 6948 Gross per 24 hour  Intake --  Output 2825 ml  Net -2825 ml      Physical Exam    General: NAD Neck: No JVD, no thyromegaly or thyroid nodule.  Lungs: Clear to auscultation bilaterally with normal respiratory effort. CV: Nondisplaced PMI.  Heart regular S1/S2, no S3/S4, 1/6 HSM apex.  No peripheral edema.   Abdomen: Soft, nontender, no hepatosplenomegaly, no distention.  Skin: Intact without lesions or rashes.  Neurologic: Alert and oriented x 3.  Psych: Normal affect. Extremities: No clubbing or cyanosis.  HEENT: Normal.    Telemetry   NSR with occasional PVCs (personally reviewed).   Labs    CBC Recent Labs    09/07/21 1309  WBC 10.6*  NEUTROABS 6.7  HGB 13.0  HCT 39.9  MCV 84.0  PLT 546   Basic Metabolic Panel Recent Labs    09/07/21 1309 09/08/21 0301 09/09/21 0220  NA 134* 136 135  K 4.2 4.0 4.3  CL 103 100 97*  CO2 23 27 30   GLUCOSE 142* 122* 127*  BUN 12 12 16   CREATININE 1.33* 1.20 1.24  CALCIUM 8.7* 9.4 9.4  MG 1.8  --   --    Liver Function Tests Recent Labs    09/07/21 1309  AST 25  ALT 23  ALKPHOS  55  BILITOT 0.7  PROT 5.6*  ALBUMIN 3.4*   No results for input(s): LIPASE, AMYLASE in the last 72 hours. Cardiac Enzymes No results for input(s): CKTOTAL, CKMB, CKMBINDEX, TROPONINI in the last 72 hours.  BNP: BNP (last 3 results) Recent Labs    09/07/21 1309  BNP 889.6*    ProBNP (last 3 results) No results for input(s): PROBNP in the last 8760 hours.   D-Dimer No results for input(s): DDIMER in the last 72 hours. Hemoglobin A1C No results for input(s): HGBA1C in the last 72 hours. Fasting Lipid Panel No results for input(s): CHOL, HDL, LDLCALC, TRIG, CHOLHDL, LDLDIRECT in the last 72 hours. Thyroid Function Tests Recent Labs    09/07/21 1309  TSH 1.361    Other results:   Imaging    No results found.   Medications:     Scheduled Medications:  amiodarone  200 mg Oral BID   vitamin C  1,000 mg Oral Daily   dapagliflozin propanediol  10 mg Oral Daily   digoxin  0.125 mg Oral Daily   DULoxetine  60 mg Oral Daily   enoxaparin (LOVENOX) injection  40 mg Subcutaneous Q24H   insulin aspart  0-9 Units Subcutaneous TID  WC   magnesium oxide  200 mg Oral Daily   multivitamin with minerals  1 tablet Oral Daily   pantoprazole  40 mg Oral Daily   potassium chloride  20 mEq Oral Daily   sacubitril-valsartan  1 tablet Oral BID   spironolactone  12.5 mg Oral Daily   tamsulosin  0.4 mg Oral QPC supper   vitamin B-12  1,000 mcg Oral Daily    Infusions:    PRN Medications: acetaminophen, ALPRAZolam, nitroGLYCERIN, ondansetron (ZOFRAN) IV, zolpidem    Assessment/Plan   1. Acute systolic HF - Echo EF 24-23%. LV dilated (6.5cm) with severe central MR. Global HK with ? 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 Weight down, looks euvolemic.  Can stop Lasix. Reassess need with [facility] tomorrow.  - Continue digoxin and spironolactone.  - Continue Entresto 24/26 bid.  - Add Farxiga  10 mg daily.  - Plan R/L cath Monday, discussed risks/benefits and patient agrees . If cath negative plan MRI - Will need LifeVest at d/c +/- eventual CRT-D   2. Frequent PVCs - on telemetry 40-50 PVCs/minute initially.  - Now on amiodarone 200 mg bid with occasional PVCs.  - keep K> 4.0 Mg > 2.0   3. Severe mitral regurgitation - functional - should improve with treatment of HF and possibly CRT   4. COPD - quit smoking 2009   5. Borderline DM2 - check HgA1c - cover with SSI   6. H/o prostate CA - s/p radioactive seeds   7. Hypomag - supp   Length of Stay: 2  Loralie Champagne, MD  09/09/2021, 9:51 AM  Advanced Heart Failure Team Pager [phone] (M-F; 7a - 5p)  Please contact [facility] for night-coverage after hours (5p -7a ) and weekends on [email or web address]

## 2021-09-10 ENCOUNTER — Encounter (HOSPITAL_COMMUNITY)
Admission: EM | Disposition: A | Discharge status: Home / Self Care | Payer: Self-pay | Source: Home / Self Care | Attending: Internal Medicine

## 2021-09-10 ENCOUNTER — Other Ambulatory Visit: Payer: Self-pay | Admitting: Physician Assistant

## 2021-09-10 ENCOUNTER — Encounter (HOSPITAL_COMMUNITY): Payer: Self-pay | Admitting: Internal Medicine

## 2021-09-10 ENCOUNTER — Other Ambulatory Visit: Payer: Self-pay

## 2021-09-10 DIAGNOSIS — Z122 Encounter for screening for malignant neoplasm of respiratory organs: Secondary | ICD-10-CM

## 2021-09-10 DIAGNOSIS — I2510 Atherosclerotic heart disease of native coronary artery without angina pectoris: Secondary | ICD-10-CM

## 2021-09-10 HISTORY — PX: RIGHT/LEFT HEART CATH AND CORONARY ANGIOGRAPHY: CATH118266

## 2021-09-10 HISTORY — DX: Atherosclerotic heart disease of native coronary artery without angina pectoris: I25.10

## 2021-09-10 LAB — HEMOGLOBIN A1C
Hgb A1c MFr Bld: 6 % — ABNORMAL HIGH (ref 4.8–5.6)
Mean Plasma Glucose: 126 mg/dL

## 2021-09-10 LAB — BASIC METABOLIC PANEL
Anion gap: 8 (ref 5–15)
BUN: 18 mg/dL (ref 6–20)
CO2: 24 mmol/L (ref 22–32)
Calcium: 9.1 mg/dL (ref 8.9–10.3)
Chloride: 102 mmol/L (ref 98–111)
Creatinine, Ser: 1.15 mg/dL (ref 0.61–1.24)
GFR, Estimated: >60 mL/min (ref >60)
Glucose, Bld: 114 mg/dL — ABNORMAL HIGH (ref 70–99)
Potassium: 4 mmol/L (ref 3.5–5.1)
Sodium: 134 mmol/L — ABNORMAL LOW (ref 135–145)

## 2021-09-10 LAB — POCT I-STAT EG7
Acid-Base Excess: 0 mmol/L (ref 0.0–2.0)
Acid-base deficit: 1 mmol/L (ref 0.0–2.0)
Acid-base deficit: 2 mmol/L (ref 0.0–2.0)
Bicarbonate: 24.6 mmol/L (ref 20.0–28.0)
Bicarbonate: 24.9 mmol/L (ref 20.0–28.0)
Bicarbonate: 25.8 mmol/L (ref 20.0–28.0)
Calcium, Ion: 1.15 mmol/L (ref 1.15–1.40)
Calcium, Ion: 1.25 mmol/L (ref 1.15–1.40)
Calcium, Ion: 1.26 mmol/L (ref 1.15–1.40)
HCT: 41 % (ref 39.0–52.0)
HCT: 44 % (ref 39.0–52.0)
HCT: 45 % (ref 39.0–52.0)
Hemoglobin: 13.9 g/dL (ref 13.0–17.0)
Hemoglobin: 15 g/dL (ref 13.0–17.0)
Hemoglobin: 15.3 g/dL (ref 13.0–17.0)
O2 Saturation: 68 %
O2 Saturation: 69 %
O2 Saturation: 74 %
Potassium: 4.2 mmol/L (ref 3.5–5.1)
Potassium: 4.3 mmol/L (ref 3.5–5.1)
Potassium: 4.5 mmol/L (ref 3.5–5.1)
Sodium: 136 mmol/L (ref 135–145)
Sodium: 137 mmol/L (ref 135–145)
Sodium: 137 mmol/L (ref 135–145)
TCO2: 26 mmol/L (ref 22–32)
TCO2: 26 mmol/L (ref 22–32)
TCO2: 27 mmol/L (ref 22–32)
pCO2, Ven: 44 mmHg (ref 44.0–60.0)
pCO2, Ven: 45.6 mmHg (ref 44.0–60.0)
pCO2, Ven: 46.4 mmHg (ref 44.0–60.0)
pH, Ven: 7.331 (ref 7.250–7.430)
pH, Ven: 7.36 (ref 7.250–7.430)
pH, Ven: 7.362 (ref 7.250–7.430)
pO2, Ven: 37 mmHg (ref 32.0–45.0)
pO2, Ven: 38 mmHg (ref 32.0–45.0)
pO2, Ven: 42 mmHg (ref 32.0–45.0)

## 2021-09-10 LAB — GLUCOSE, CAPILLARY
Glucose-Capillary: 105 mg/dL — ABNORMAL HIGH (ref 70–99)
Glucose-Capillary: 165 mg/dL — ABNORMAL HIGH (ref 70–99)
Glucose-Capillary: 112 mg/dL — ABNORMAL HIGH (ref 70–99)

## 2021-09-10 LAB — CBC
HCT: 42.5 % (ref 39.0–52.0)
Hemoglobin: 13.7 g/dL (ref 13.0–17.0)
MCH: 26.4 pg (ref 26.0–34.0)
MCHC: 32.2 g/dL (ref 30.0–36.0)
MCV: 82 fL (ref 80.0–100.0)
Platelets: 363 10*3/uL (ref 150–400)
RBC: 5.18 MIL/uL (ref 4.22–5.81)
RDW: 13.6 % (ref 11.5–15.5)
WBC: 9 10*3/uL (ref 4.0–10.5)
nRBC: 0 % (ref 0.0–0.2)

## 2021-09-10 LAB — POCT I-STAT 7, (LYTES, BLD GAS, ICA,H+H)
Acid-base deficit: 3 mmol/L — ABNORMAL HIGH (ref 0.0–2.0)
Bicarbonate: 21.8 mmol/L (ref 20.0–28.0)
Calcium, Ion: 0.92 mmol/L — ABNORMAL LOW (ref 1.15–1.40)
HCT: 39 % (ref 39.0–52.0)
Hemoglobin: 13.3 g/dL (ref 13.0–17.0)
O2 Saturation: 93 %
Potassium: 3.6 mmol/L (ref 3.5–5.1)
Sodium: 142 mmol/L (ref 135–145)
TCO2: 23 mmol/L (ref 22–32)
pCO2 arterial: 37.3 mmHg (ref 32.0–48.0)
pH, Arterial: 7.374 (ref 7.350–7.450)
pO2, Arterial: 70 mmHg — ABNORMAL LOW (ref 83.0–108.0)

## 2021-09-10 LAB — LIPID PANEL
Cholesterol: 175 mg/dL (ref 0–200)
HDL: 52 mg/dL (ref >40)
LDL Cholesterol: 104 mg/dL — ABNORMAL HIGH (ref 0–99)
Total CHOL/HDL Ratio: 3.4 RATIO
Triglycerides: 96 mg/dL (ref <150)
VLDL: 19 mg/dL (ref 0–40)

## 2021-09-10 LAB — CARDIAC CATHETERIZATION: Cath EF Quantitative: 25 %

## 2021-09-10 LAB — CREATININE, SERUM
Creatinine, Ser: 1 mg/dL (ref 0.61–1.24)
GFR, Estimated: >60 mL/min (ref >60)

## 2021-09-10 SURGERY — RIGHT/LEFT HEART CATH AND CORONARY ANGIOGRAPHY
Anesthesia: LOCAL

## 2021-09-10 MED ORDER — SODIUM CHLORIDE 0.9 % IV SOLN: 250.0000 mL | INTRAVENOUS | Status: DC | PRN

## 2021-09-10 MED ORDER — HEPARIN (PORCINE) IN NACL 1000-0.9 UT/500ML-% IV SOLN
INTRAVENOUS | Status: AC
  Filled 2021-09-10: qty 1000

## 2021-09-10 MED ORDER — VERAPAMIL HCL 2.5 MG/ML IV SOLN
INTRAVENOUS | Status: DC | PRN
  Administered 2021-09-10: 09:00:00 10 mL via INTRA_ARTERIAL

## 2021-09-10 MED ORDER — MIDAZOLAM HCL 2 MG/2ML IJ SOLN
INTRAMUSCULAR | Status: AC
  Filled 2021-09-10: qty 2

## 2021-09-10 MED ORDER — HEPARIN SODIUM (PORCINE) 1000 UNIT/ML IJ SOLN
INTRAMUSCULAR | Status: AC
  Filled 2021-09-10: qty 10

## 2021-09-10 MED ORDER — HEPARIN (PORCINE) IN NACL 1000-0.9 UT/500ML-% IV SOLN
INTRAVENOUS | Status: DC | PRN
  Administered 2021-09-10 (×2): 500 mL

## 2021-09-10 MED ORDER — HEPARIN SODIUM (PORCINE) 1000 UNIT/ML IJ SOLN
INTRAMUSCULAR | Status: DC | PRN
  Administered 2021-09-10: 4000 [IU] via INTRAVENOUS

## 2021-09-10 MED ORDER — LABETALOL HCL 5 MG/ML IV SOLN: 10.0000 mg | INTRAVENOUS | Status: AC | PRN

## 2021-09-10 MED ORDER — LIDOCAINE HCL (PF) 1 % IJ SOLN
INTRAMUSCULAR | Status: AC
  Filled 2021-09-10: qty 30

## 2021-09-10 MED ORDER — ONDANSETRON HCL 4 MG/2ML IJ SOLN: 4.0000 mg | Freq: Four times a day (QID) | INTRAMUSCULAR | Status: DC | PRN

## 2021-09-10 MED ORDER — ATORVASTATIN CALCIUM 40 MG PO TABS
40.0000 mg | ORAL_TABLET | Freq: Every day | ORAL | Status: DC
  Administered 2021-09-10 – 2021-09-11 (×2): 40 mg via ORAL
  Filled 2021-09-10 (×2): qty 1

## 2021-09-10 MED ORDER — MIDAZOLAM HCL 2 MG/2ML IJ SOLN
INTRAMUSCULAR | Status: DC | PRN
  Administered 2021-09-10 (×2): 1 mg via INTRAVENOUS

## 2021-09-10 MED ORDER — VERAPAMIL HCL 2.5 MG/ML IV SOLN
INTRAVENOUS | Status: AC
  Filled 2021-09-10: qty 2

## 2021-09-10 MED ORDER — IOHEXOL 350 MG/ML SOLN
INTRAVENOUS | Status: DC | PRN
  Administered 2021-09-10: 10:00:00 45 mL

## 2021-09-10 MED ORDER — FENTANYL CITRATE (PF) 100 MCG/2ML IJ SOLN
INTRAMUSCULAR | Status: AC
  Filled 2021-09-10: qty 2

## 2021-09-10 MED ORDER — ACETAMINOPHEN 325 MG PO TABS: 650.0000 mg | ORAL_TABLET | ORAL | Status: DC | PRN

## 2021-09-10 MED ORDER — SODIUM CHLORIDE 0.9 % IV SOLN: INTRAVENOUS | Status: AC

## 2021-09-10 MED ORDER — LIDOCAINE HCL (PF) 1 % IJ SOLN
INTRAMUSCULAR | Status: DC | PRN
  Administered 2021-09-10 (×2): 2 mL

## 2021-09-10 MED ORDER — FENTANYL CITRATE (PF) 100 MCG/2ML IJ SOLN
INTRAMUSCULAR | Status: DC | PRN
  Administered 2021-09-10 (×2): 25 ug via INTRAVENOUS

## 2021-09-10 MED ORDER — SODIUM CHLORIDE 0.9% FLUSH
3.0000 mL | Freq: Two times a day (BID) | INTRAVENOUS | Status: DC
  Administered 2021-09-10 – 2021-09-11 (×3): 3 mL via INTRAVENOUS

## 2021-09-10 MED ORDER — HYDRALAZINE HCL 20 MG/ML IJ SOLN: 10.0000 mg | INTRAMUSCULAR | Status: AC | PRN

## 2021-09-10 MED ORDER — SODIUM CHLORIDE 0.9% FLUSH: 3.0000 mL | INTRAVENOUS | Status: DC | PRN

## 2021-09-10 MED ORDER — ENOXAPARIN SODIUM 40 MG/0.4ML IJ SOSY: 40.0000 mg | PREFILLED_SYRINGE | INTRAMUSCULAR | Status: DC

## 2021-09-10 SURGICAL SUPPLY — 11 items
CATH BALLN WEDGE 5F 110CM (CATHETERS) ×2 IMPLANT
CATH IMPULSE 5F ANG/FL3.5 (CATHETERS) ×2 IMPLANT
DEVICE RAD COMP TR BAND LRG (VASCULAR PRODUCTS) ×2 IMPLANT
GLIDESHEATH SLEND SS 6F .021 (SHEATH) ×2 IMPLANT
GUIDEWIRE .025 260CM (WIRE) ×2 IMPLANT
GUIDEWIRE INQWIRE 1.5J.035X260 (WIRE) ×1 IMPLANT
INQWIRE 1.5J .035X260CM (WIRE) ×2
KIT HEART LEFT (KITS) ×2 IMPLANT
PACK CARDIAC CATHETERIZATION (CUSTOM PROCEDURE TRAY) ×2 IMPLANT
SHEATH GLIDE SLENDER 4/5FR (SHEATH) ×2 IMPLANT
TRANSDUCER W/STOPCOCK (MISCELLANEOUS) ×2 IMPLANT

## 2021-09-10 NOTE — TOC CM/SW Note (Addendum)
HF TOC CM faxed cardiac cath report and echo report to [place]. Edwin Cap CM/[place] CSW -HF Team Following   HF TOC CM faxed demographics, echo, progress notes and order to [employer], rep [place]. Waiting cardiac cath report. [place], Heart Failure TOC CM [phone]

## 2021-09-10 NOTE — Progress Notes (Addendum)
Patient ID: Ruben Duffy, male   DOB: [birth year], 55 y.o.   MRN: [id number]     Advanced Heart Failure Rounding Note  PCP-Cardiologist: Quay Burow, MD   Subjective:   Ruben Duffy started on farxiga.   Denies SOB. Denies chest pain.     Objective:   Weight Range: 85.2 kg Body mass index is 28.55 kg/m.   Vital Signs:   Temp:  [97.7 F (36.5 C)-98 F (36.7 C)] 97.9 F (36.6 C) (11/28 0606) Pulse Rate:  [84-88] 85 (11/28 0606) Resp:  [16-18] 16 (11/28 0606) BP: (101-109)/(65-77) 101/69 (11/28 0606) SpO2:  [95 %-96 %] 96 % (11/28 0606) Weight:  [85.2 kg] 85.2 kg (11/28 0606) Last BM Date: 09/09/21  Weight change: Filed Weights   09/08/21 0521 09/09/21 0610 09/10/21 0606  Weight: 85.5 kg 84.8 kg 85.2 kg    Intake/Output:   Intake/Output Summary (Last 24 hours) at 09/10/2021 0729 Last data filed at 09/09/2021 2346 Gross per 24 hour  Intake 600 ml  Output 2900 ml  Net -2300 ml      Physical Exam   General:   No resp difficulty HEENT: normal Neck: supple. no JVD. Carotids 2+ bilat; no bruits. No lymphadenopathy or thryomegaly appreciated. Cor: PMI nondisplaced. Regular rate & rhythm. No rubs, gallops or murmurs. Lungs: clear Abdomen: soft, nontender, nondistended. No hepatosplenomegaly. No bruits or masses. Good bowel sounds. Extremities: no cyanosis, clubbing, rash, edema Neuro: alert & orientedx3, cranial nerves grossly intact. moves all 4 extremities w/o difficulty. Affect pleasant   Telemetry   NSR with occasional PVCs.  < 15 hour  Labs    CBC Recent Labs    09/07/21 1309  WBC 10.6*  NEUTROABS 6.7  HGB 13.0  HCT 39.9  MCV 84.0  PLT 409   Basic Metabolic Panel Recent Labs    09/07/21 1309 09/08/21 0301 09/09/21 0220 09/10/21 0235  NA 134*   < > 135 134*  K 4.2   < > 4.3 4.0  CL 103   < > 97* 102  CO2 23   < > 30 24  GLUCOSE 142*   < > 127* 114*  BUN 12   < > 16 18  CREATININE 1.33*   < > 1.24 1.15  CALCIUM 8.7*   < > 9.4 9.1  MG 1.8   --   --   --    < > = values in this interval not displayed.   Liver Function Tests Recent Labs    09/07/21 1309  AST 25  ALT 23  ALKPHOS 55  BILITOT 0.7  PROT 5.6*  ALBUMIN 3.4*   No results for input(s): LIPASE, AMYLASE in the last 72 hours. Cardiac Enzymes No results for input(s): CKTOTAL, CKMB, CKMBINDEX, TROPONINI in the last 72 hours.  BNP: BNP (last 3 results) Recent Labs    09/07/21 1309  BNP 889.6*    ProBNP (last 3 results) No results for input(s): PROBNP in the last 8760 hours.   D-Dimer No results for input(s): DDIMER in the last 72 hours. Hemoglobin A1C Recent Labs    09/08/21 0301  HGBA1C 6.0*   Fasting Lipid Panel Recent Labs    09/10/21 0235  CHOL 175  HDL 52  LDLCALC 104*  TRIG 96  CHOLHDL 3.4   Thyroid Function Tests Recent Labs    09/07/21 1309  TSH 1.361    Other results:   Imaging    No results found.   Medications:     Scheduled Medications:  amiodarone  200 mg Oral BID   vitamin C  1,000 mg Oral Daily   dapagliflozin propanediol  10 mg Oral Daily   digoxin  0.125 mg Oral Daily   DULoxetine  60 mg Oral Daily   enoxaparin (LOVENOX) injection  40 mg Subcutaneous Q24H   insulin aspart  0-9 Units Subcutaneous TID WC   magnesium oxide  200 mg Oral Daily   multivitamin with minerals  1 tablet Oral Daily   pantoprazole  40 mg Oral Daily   potassium chloride  20 mEq Oral Daily   sacubitril-valsartan  1 tablet Oral BID   sodium chloride flush  3 mL Intravenous Q12H   spironolactone  12.5 mg Oral Daily   tamsulosin  0.4 mg Oral QPC supper   valACYclovir  500 mg Oral BID   vitamin B-12  1,000 mcg Oral Daily    Infusions:  sodium chloride     sodium chloride 10 mL/hr at 09/09/21 2346     PRN Medications: sodium chloride, acetaminophen, ALPRAZolam, nitroGLYCERIN, ondansetron (ZOFRAN) IV, sodium chloride flush, zolpidem    Assessment/Plan   1. Acute systolic HF - Echo EF 85-27%. LV dilated (6.5cm) with  severe central MR. Global HK with ? 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 Functional improvement after diuresing.  - Volume status stable. Adjust diuretics after cath. - Continue digoxin and spironolactone.  - Continue Entresto 24/26 bid.  - Continue Farxiga 10 mg daily.  - Plan R/L cath today , discussed risks/benefits and patient agrees . If cath negative plan MRI - Will need LifeVest at d/c +/- eventual CRT-D - Will request Life Vest --ordered and discussed    2. Frequent PVCs - on telemetry 40-50 PVCs/minute initially. Now rare PVCs.  - Now on amiodarone 200 mg bid with occasional PVCs.  - keep K> 4.0 Mg > 2.0 - as above will need Life Vest at d/c    3. Severe mitral regurgitation - functional - should improve with treatment of HF and possibly CRT   4. COPD - quit smoking 2009   5. Borderline DM2 - check HgA1c - cover with SSI   6. H/o prostate CA - s/p radioactive seeds   7. Hypomag - supp  -Check Mag in am.   Consult cardiac rehab.   Order placed for Life Vest. Discussed.   Length of Stay: 3  [name], NP  09/10/2021, 7:29 AM  Advanced Heart Failure Team Pager [phone] (M-F; 7a - 5p)  Please contact [facility] for night-coverage after hours (5p -7a ) and weekends on [email or web address]  Patient seen and examined with the above-signed Advanced Practice Provider and/or Housestaff. I personally reviewed laboratory data, imaging studies and relevant notes. I independently examined the patient and formulated the important aspects of the plan. I have edited the note to reflect any of my changes or salient points. I have personally discussed the plan with the patient and/or family.  Has diuresed well. Feeling better. No CP or SOB. PVC burden reduced on amio.   Denies SOB, orthopnea or PND. Tolerating GDMT. Resting tachycardiac improving.  General:  Well appearing. No resp difficulty HEENT: normal Neck:  supple. no JVD. Carotids 2+ bilat; no bruits. No lymphadenopathy or thryomegaly appreciated. Cor: PMI nondisplaced. Regular rate & rhythm. No rubs, gallops or murmurs. Lungs: clear Abdomen: soft, nontender, nondistended. No hepatosplenomegaly. No bruits or masses. Good bowel sounds. Extremities: no cyanosis, clubbing, rash, edema Neuro: alert &  orientedx3, cranial nerves grossly intact. moves all 4 extremities w/o difficulty. Affect pleasant  Stabilizing with GDMT and diuresis. PVC burden improving. Plan R/L cath today. Procedure discussed. If cath without CAD will need cMRI. LifeVest ordered.  Glori Bickers, MD  8:50 AM

## 2021-09-10 NOTE — Interval H&P Note (Signed)
History and Physical Interval Note:  09/10/2021 8:54 AM  Ruben Duffy  has presented today for surgery, with the diagnosis of HF.  The various methods of treatment have been discussed with the patient and family. After consideration of risks, benefits and other options for treatment, the patient has consented to  Procedure(s): RIGHT/LEFT HEART CATH AND CORONARY ANGIOGRAPHY (N/A) and possible coronary angioplasty as a surgical intervention.  The patient's history has been reviewed, patient examined, no change in status, stable for surgery.  I have reviewed the patient's chart and labs.  Questions were answered to the patient's satisfaction.     Debora Stockdale

## 2021-09-10 NOTE — H&P (View-Only) (Signed)
Patient ID: Ruben Duffy, male   DOB: August 01, 1966, 55 y.o.   MRN: [id number]     Advanced Heart Failure Rounding Note  PCP-Cardiologist: Quay Burow, MD   Subjective:   Ruben Duffy started on farxiga.   Denies SOB. Denies chest pain.     Objective:   Weight Range: 85.2 kg Body mass index is 28.55 kg/m.   Vital Signs:   Temp:  [97.7 F (36.5 C)-98 F (36.7 C)] 97.9 F (36.6 C) (11/28 0606) Pulse Rate:  [84-88] 85 (11/28 0606) Resp:  [16-18] 16 (11/28 0606) BP: (101-109)/(65-77) 101/69 (11/28 0606) SpO2:  [95 %-96 %] 96 % (11/28 0606) Weight:  [85.2 kg] 85.2 kg (11/28 0606) Last BM Date: 09/09/21  Weight change: Filed Weights   09/08/21 0521 09/09/21 0610 09/10/21 0606  Weight: 85.5 kg 84.8 kg 85.2 kg    Intake/Output:   Intake/Output Summary (Last 24 hours) at 09/10/2021 0729 Last data filed at 09/09/2021 2346 Gross per 24 hour  Intake 600 ml  Output 2900 ml  Net -2300 ml      Physical Exam   General:   No resp difficulty HEENT: normal Neck: supple. no JVD. Carotids 2+ bilat; no bruits. No lymphadenopathy or thryomegaly appreciated. Cor: PMI nondisplaced. Regular rate & rhythm. No rubs, gallops or murmurs. Lungs: clear Abdomen: soft, nontender, nondistended. No hepatosplenomegaly. No bruits or masses. Good bowel sounds. Extremities: no cyanosis, clubbing, rash, edema Neuro: alert & orientedx3, cranial nerves grossly intact. moves all 4 extremities w/o difficulty. Affect pleasant   Telemetry   NSR with occasional PVCs.  < 15 hour  Labs    CBC Recent Labs    09/07/21 1309  WBC 10.6*  NEUTROABS 6.7  HGB 13.0  HCT 39.9  MCV 84.0  PLT 287   Basic Metabolic Panel Recent Labs    09/07/21 1309 09/08/21 0301 09/09/21 0220 09/10/21 0235  NA 134*   < > 135 134*  K 4.2   < > 4.3 4.0  CL 103   < > 97* 102  CO2 23   < > 30 24  GLUCOSE 142*   < > 127* 114*  BUN 12   < > 16 18  CREATININE 1.33*   < > 1.24 1.15  CALCIUM 8.7*   < > 9.4 9.1  MG 1.8   --   --   --    < > = values in this interval not displayed.   Liver Function Tests Recent Labs    09/07/21 1309  AST 25  ALT 23  ALKPHOS 55  BILITOT 0.7  PROT 5.6*  ALBUMIN 3.4*   No results for input(s): LIPASE, AMYLASE in the last 72 hours. Cardiac Enzymes No results for input(s): CKTOTAL, CKMB, CKMBINDEX, TROPONINI in the last 72 hours.  BNP: BNP (last 3 results) Recent Labs    09/07/21 1309  BNP 889.6*    ProBNP (last 3 results) No results for input(s): PROBNP in the last 8760 hours.   D-Dimer No results for input(s): DDIMER in the last 72 hours. Hemoglobin A1C Recent Labs    09/08/21 0301  HGBA1C 6.0*   Fasting Lipid Panel Recent Labs    09/10/21 0235  CHOL 175  HDL 52  LDLCALC 104*  TRIG 96  CHOLHDL 3.4   Thyroid Function Tests Recent Labs    09/07/21 1309  TSH 1.361    Other results:   Imaging    No results found.   Medications:     Scheduled Medications:  amiodarone  200 mg Oral BID   vitamin C  1,000 mg Oral Daily   dapagliflozin propanediol  10 mg Oral Daily   digoxin  0.125 mg Oral Daily   DULoxetine  60 mg Oral Daily   enoxaparin (LOVENOX) injection  40 mg Subcutaneous Q24H   insulin aspart  0-9 Units Subcutaneous TID WC   magnesium oxide  200 mg Oral Daily   multivitamin with minerals  1 tablet Oral Daily   pantoprazole  40 mg Oral Daily   potassium chloride  20 mEq Oral Daily   sacubitril-valsartan  1 tablet Oral BID   sodium chloride flush  3 mL Intravenous Q12H   spironolactone  12.5 mg Oral Daily   tamsulosin  0.4 mg Oral QPC supper   valACYclovir  500 mg Oral BID   vitamin B-12  1,000 mcg Oral Daily    Infusions:  sodium chloride     sodium chloride 10 mL/hr at 09/09/21 2346     PRN Medications: sodium chloride, acetaminophen, ALPRAZolam, nitroGLYCERIN, ondansetron (ZOFRAN) IV, sodium chloride flush, zolpidem    Assessment/Plan   1. Acute systolic HF - Echo EF 34-19%. LV dilated (6.5cm) with  severe central MR. Global HK with ? 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 Functional improvement after diuresing.  - Volume status stable. Adjust diuretics after cath. - Continue digoxin and spironolactone.  - Continue Entresto 24/26 bid.  - Continue Farxiga 10 mg daily.  - Plan R/L cath today , discussed risks/benefits and patient agrees . If cath negative plan MRI - Will need LifeVest at d/c +/- eventual CRT-D - Will request Life Vest --ordered and discussed    2. Frequent PVCs - on telemetry 40-50 PVCs/minute initially. Now rare PVCs.  - Now on amiodarone 200 mg bid with occasional PVCs.  - keep K> 4.0 Mg > 2.0 - as above will need Life Vest at d/c    3. Severe mitral regurgitation - functional - should improve with treatment of HF and possibly CRT   4. COPD - quit smoking 2009   5. Borderline DM2 - check HgA1c - cover with SSI   6. H/o prostate CA - s/p radioactive seeds   7. Hypomag - supp  -Check Mag in am.   Consult cardiac rehab.   Order placed for Life Vest. Discussed.   Length of Stay: 3  [name], NP  09/10/2021, 7:29 AM  Advanced Heart Failure Team Pager [phone] (M-F; 7a - 5p)  Please contact [facility] for night-coverage after hours (5p -7a ) and weekends on [email or web address]  Patient seen and examined with the above-signed Advanced Practice Provider and/or Housestaff. I personally reviewed laboratory data, imaging studies and relevant notes. I independently examined the patient and formulated the important aspects of the plan. I have edited the note to reflect any of my changes or salient points. I have personally discussed the plan with the patient and/or family.  Has diuresed well. Feeling better. No CP or SOB. PVC burden reduced on amio.   Denies SOB, orthopnea or PND. Tolerating GDMT. Resting tachycardiac improving.  General:  Well appearing. No resp difficulty HEENT: normal Neck:  supple. no JVD. Carotids 2+ bilat; no bruits. No lymphadenopathy or thryomegaly appreciated. Cor: PMI nondisplaced. Regular rate & rhythm. No rubs, gallops or murmurs. Lungs: clear Abdomen: soft, nontender, nondistended. No hepatosplenomegaly. No bruits or masses. Good bowel sounds. Extremities: no cyanosis, clubbing, rash, edema Neuro: alert &  orientedx3, cranial nerves grossly intact. moves all 4 extremities w/o difficulty. Affect pleasant  Stabilizing with GDMT and diuresis. PVC burden improving. Plan R/L cath today. Procedure discussed. If cath without CAD will need cMRI. LifeVest ordered.  Glori Bickers, MD  8:50 AM
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
## 2021-09-11 ENCOUNTER — Inpatient Hospital Stay (HOSPITAL_COMMUNITY): Payer: 59

## 2021-09-11 ENCOUNTER — Other Ambulatory Visit (HOSPITAL_COMMUNITY): Payer: Self-pay

## 2021-09-11 DIAGNOSIS — I509 Heart failure, unspecified: Secondary | ICD-10-CM

## 2021-09-11 LAB — BASIC METABOLIC PANEL
Anion gap: 8 (ref 5–15)
BUN: 14 mg/dL (ref 6–20)
CO2: 21 mmol/L — ABNORMAL LOW (ref 22–32)
Calcium: 8.8 mg/dL — ABNORMAL LOW (ref 8.9–10.3)
Chloride: 104 mmol/L (ref 98–111)
Creatinine, Ser: 1.09 mg/dL (ref 0.61–1.24)
GFR, Estimated: >60 mL/min (ref >60)
Glucose, Bld: 104 mg/dL — ABNORMAL HIGH (ref 70–99)
Potassium: 4.5 mmol/L (ref 3.5–5.1)
Sodium: 133 mmol/L — ABNORMAL LOW (ref 135–145)

## 2021-09-11 LAB — MAGNESIUM: Magnesium: 2 mg/dL (ref 1.7–2.4)

## 2021-09-11 MED ORDER — DIGOXIN 125 MCG PO TABS
0.1250 mg | ORAL_TABLET | Freq: Every day | ORAL | 6 refills | Status: DC
  Filled 2021-09-11: qty 30, 30d supply, fill #0

## 2021-09-11 MED ORDER — SPIRONOLACTONE 25 MG PO TABS
25.0000 mg | ORAL_TABLET | Freq: Every day | ORAL | Status: DC
  Administered 2021-09-11: 25 mg via ORAL

## 2021-09-11 MED ORDER — DAPAGLIFLOZIN PROPANEDIOL 10 MG PO TABS
10.0000 mg | ORAL_TABLET | Freq: Every day | ORAL | 6 refills | Status: DC
  Filled 2021-09-11: qty 30, 30d supply, fill #0

## 2021-09-11 MED ORDER — AMIODARONE HCL 200 MG PO TABS
200.0000 mg | ORAL_TABLET | Freq: Two times a day (BID) | ORAL | 6 refills | Status: DC
  Filled 2021-09-11: qty 60, 30d supply, fill #0

## 2021-09-11 MED ORDER — GADOBUTROL 1 MMOL/ML IV SOLN
8.0000 mL | Freq: Once | INTRAVENOUS | Status: AC | PRN
  Administered 2021-09-11: 8 mL via INTRAVENOUS

## 2021-09-11 MED ORDER — MAGNESIUM OXIDE 400 MG PO TABS
200.0000 mg | ORAL_TABLET | Freq: Every day | ORAL | 6 refills | Status: DC
  Filled 2021-09-11: qty 30, 60d supply, fill #0

## 2021-09-11 MED ORDER — SPIRONOLACTONE 25 MG PO TABS
25.0000 mg | ORAL_TABLET | Freq: Every day | ORAL | 6 refills | Status: DC
  Filled 2021-09-11: qty 30, 30d supply, fill #0

## 2021-09-11 MED ORDER — ATORVASTATIN CALCIUM 40 MG PO TABS
40.0000 mg | ORAL_TABLET | Freq: Every day | ORAL | 6 refills | Status: DC
  Filled 2021-09-11: qty 40, 40d supply, fill #0

## 2021-09-11 NOTE — Progress Notes (Addendum)
Patient ID: [name], male   DOB: 1965-11-09, 55 y.o.   MRN: [id number]     Advanced Heart Failure Rounding Note  PCP-Cardiologist: Quay Burow, MD   Subjective:   Yesterday had RHC/LHC with minimal obstructive CAD, low filling pressure, normal output and severe NICM   NO complaints. No chest pain. Denies SOB.     Objective:   Weight Range: 85.1 kg Body mass index is 28.54 kg/m.   Vital Signs:   Temp:  [97.4 F (36.3 C)-98 F (36.7 C)] 97.6 F (36.4 C) (11/29 0746) Pulse Rate:  [77-99] 90 (11/29 0746) Resp:  [0-57] 17 (11/29 0746) BP: (87-120)/(57-79) 116/78 (11/29 0746) SpO2:  [87 %-98 %] 98 % (11/29 0746) Weight:  [85.1 kg] 85.1 kg (11/29 0448) Last BM Date: 09/10/21  Weight change: Filed Weights   09/09/21 0610 09/10/21 0606 09/11/21 0448  Weight: 84.8 kg 85.2 kg 85.1 kg    Intake/Output:   Intake/Output Summary (Last 24 hours) at 09/11/2021 0816 Last data filed at 09/11/2021 0452 Gross per 24 hour  Intake 924.65 ml  Output 2450 ml  Net -1525.35 ml      Physical Exam   General:  Well appearing. No resp difficulty HEENT: normal Neck: supple. no JVD. Carotids 2+ bilat; no bruits. No lymphadenopathy or thryomegaly appreciated. Cor: PMI nondisplaced. Regular rate & rhythm. No rubs, gallops or murmurs. Lungs: clear Abdomen: soft, nontender, nondistended. No hepatosplenomegaly. No bruits or masses. Good bowel sounds. Extremities: no cyanosis, clubbing, rash, edema Neuro: alert & orientedx3, cranial nerves grossly intact. moves all 4 extremities w/o difficulty. Affect pleasant  Telemetry  NSR with rare  PVCs. < 5 PVCs a day   Labs    CBC Recent Labs    09/10/21 0940 09/10/21 1026  WBC  --  9.0  HGB 13.9 13.7  HCT 41.0 42.5  MCV  --  82.0  PLT  --  379   Basic Metabolic Panel Recent Labs    09/10/21 0235 09/10/21 0928 09/10/21 0940 09/10/21 1026 09/11/21 0156  NA 134*   < > 137  --  133*  K 4.0   < > 4.2  --  4.5  CL 102  --   --    --  104  CO2 24  --   --   --  21*  GLUCOSE 114*  --   --   --  104*  BUN 18  --   --   --  14  CREATININE 1.15  --   --  1.00 1.09  CALCIUM 9.1  --   --   --  8.8*  MG  --   --   --   --  2.0   < > = values in this interval not displayed.   Liver Function Tests No results for input(s): AST, ALT, ALKPHOS, BILITOT, PROT, ALBUMIN in the last 72 hours.  No results for input(s): LIPASE, AMYLASE in the last 72 hours. Cardiac Enzymes No results for input(s): CKTOTAL, CKMB, CKMBINDEX, TROPONINI in the last 72 hours.  BNP: BNP (last 3 results) Recent Labs    09/07/21 1309  BNP 889.6*    ProBNP (last 3 results) No results for input(s): PROBNP in the last 8760 hours.   D-Dimer No results for input(s): DDIMER in the last 72 hours. Hemoglobin A1C No results for input(s): HGBA1C in the last 72 hours.  Fasting Lipid Panel Recent Labs    09/10/21 0235  CHOL 175  HDL 52  LDLCALC 104*  TRIG 96  CHOLHDL 3.4   Thyroid Function Tests No results for input(s): TSH, T4TOTAL, T3FREE, THYROIDAB in the last 72 hours.  Invalid input(s): FREET3   Other results:   Imaging    CARDIAC CATHETERIZATION  Result Date: 09/10/2021   Prox Cx lesion is 30% stenosed. Findings: Ao = 71/49 (58) -> 83/52 (after IVF) LV = 86/5 RA = 1 RV = 23/1 PA = 24/2 (13) PCW = 5 Fick cardiac output/index = 6.2/3.1 PVR = 1.3 WU SVR = 728 FA sat = 93% PA sat = 69%, 68% SVC sat = 74% Assessment: 1. Minimal non-obstructive CAD 2. 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 Low filling pressures with normal output Plan/Discussion: Suspect PVC +/- LBBB CM. Will get cMRI. Hold Entresto for now with low BP. Continue to suppress PVCs. Glori Bickers, MD 9:51 AM    Medications:     Scheduled Medications:  amiodarone  200 mg Oral BID   vitamin C  1,000 mg Oral Daily   atorvastatin  40 mg Oral Daily   dapagliflozin propanediol  10 mg Oral Daily   digoxin  0.125 mg Oral Daily   DULoxetine  60 mg Oral Daily   enoxaparin (LOVENOX)  injection  40 mg Subcutaneous Q24H   magnesium oxide  200 mg Oral Daily   multivitamin with minerals  1 tablet Oral Daily   pantoprazole  40 mg Oral Daily   potassium chloride  20 mEq Oral Daily   sodium chloride flush  3 mL Intravenous Q12H   spironolactone  12.5 mg Oral Daily   tamsulosin  0.4 mg Oral QPC supper   valACYclovir  500 mg Oral BID   vitamin B-12  1,000 mcg Oral Daily    Infusions:  sodium chloride       PRN Medications: sodium chloride, acetaminophen, ALPRAZolam, nitroGLYCERIN, ondansetron (ZOFRAN) IV, sodium chloride flush, zolpidem    Assessment/Plan   1. Acute systolic HF - Echo EF 01-02%. LV dilated (6.5cm) with severe central MR. Global HK with ? 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 Functional improvement after diuresing.  - Low filling pressures on cath. Does not need diuretics.  - Continue digoxin  - Increase spiro to 25 mg daily.   - Entresto held due to soft BP. Consider at follow up.  - Continue Farxiga 10 mg daily.  - RHC/LHC with minimal obstructive CAD, low filling pressure, normal output and severe NICM - CMRI today.  - Life Vest  in the room.    2. Frequent PVCs - on telemetry 40-50 PVCs/minute initially. Now rare PVCs.  - Now on amiodarone 200 mg bid with < 10 PVCs per hours.   - keep K> 4.0 Mg > 2.0 - Life Vest in the room.     3. Severe mitral regurgitation - functional - should improve with treatment of HF and possibly CRT   4. COPD - quit smoking 2009   5. Borderline DM2 - check HgA1c - cover with SSI   6. H/o prostate CA - s/p radioactive seeds   7. Hypomag - Mag 2 today   CMRI this morning.     Length of Stay: 4  [name], NP  09/11/2021, 8:16 AM  Advanced Heart Failure Team Pager [phone] (M-F; 7a - 5p)  Please contact [facility] for night-coverage after hours (5p -7a ) and weekends on [email or web address]   Patient seen and examined with the  above-signed Advanced Practice  Provider and/or Housestaff. I personally reviewed laboratory data, imaging studies and relevant notes. I independently examined the patient and formulated the important aspects of the plan. I have edited the note to reflect any of my changes or salient points. I have personally discussed the plan with the patient and/or family.  Cath results reviewed with him. Suspect PVC CM +/- LBBB. PVCs improved with amio.   GDMT limited by low BP  General:  Well appearing. No resp difficulty HEENT: normal Neck: supple. no JVD. Carotids 2+ bilat; no bruits. No lymphadenopathy or thryomegaly appreciated. Cor: PMI nondisplaced. Regular rate & rhythm. No rubs, gallops or murmurs. Lungs: clear Abdomen: soft, nontender, nondistended. No hepatosplenomegaly. No bruits or masses. Good bowel sounds. Extremities: no cyanosis, clubbing, rash, edema Neuro: alert & orientedx3, cranial nerves grossly intact. moves all 4 extremities w/o difficulty. Affect pleasant  Will plan cMRI today. Likely home after MRI. Has LifeVest in room. Will need close f/u in HF Clinic.   Glori Bickers, MD  9:57 AM

## 2021-09-11 NOTE — Progress Notes (Signed)
Pt ready for d/c, has been walking independently in room. Discussed HF booklet, walking, and CRPII. Pt receptive. Will refer to [place]. While I was in room, lifevest alarmed and seemed to restart x4 different times. Off tele but pulse checked and feels normal/regular. Pt without c/o. Instructed pt to call zoll. Notified RN.  [place] 2:02 PM 09/11/2021

## 2021-09-11 NOTE — TOC Initial Note (Addendum)
Transition of Care [facility]) - Initial/Assessment Note    Patient Details  Name: Ruben Duffy MRN: [id number] Date of Birth: February 08, 1966  Transition of Care [facility]) CM/SW Contact:    Erenest Rasher, RN Phone Number: [phone] 09/11/2021, 2:39 PM  Clinical Narrative:                 HF TOC CM spoke to pt and lives with mother. Pt has meds from [place]. 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 Stated no dc needs. Will mail patient a Wilder Glade $0 copay card.   Expected Discharge Plan: Home/Self Care Barriers to Discharge: No Barriers Identified   Patient Goals and CMS Choice        Expected Discharge Plan and Services Expected Discharge Plan: Home/Self Care In-house Referral: Clinical Social Work Discharge Planning Services: CM Consult   Living arrangements for the past 2 months: Single Family Home Expected Discharge Date: 09/11/21                                    Prior Living Arrangements/Services Living arrangements for the past 2 months: Single Family Home Lives with:: Parents Patient language and need for interpreter reviewed:: No        Need for Family Participation in Patient Care: No (Comment) Care giver support system in place?: No (comment)   Criminal Activity/Legal Involvement Pertinent to Current Situation/Hospitalization: No - Comment as needed  Activities of Daily Living Home Assistive Devices/Equipment: None ADL Screening (condition at time of admission) Patient's cognitive ability adequate to safely complete daily activities?: Yes Is the patient deaf or have difficulty hearing?: No Does the patient have difficulty seeing, even when wearing glasses/contacts?: No Does the patient have difficulty concentrating, remembering, or making decisions?: No Patient able to express need for assistance with ADLs?: Yes Does the patient have difficulty dressing or bathing?: Yes Independently performs ADLs?: Yes (appropriate for developmental age) Does the patient  have difficulty walking or climbing stairs?: Yes Weakness of Legs: None (SOB) Weakness of Arms/Hands: None  Permission Sought/Granted Permission sought to share information with : Case Manager, Family Supports, PCP Permission granted to share information with : Yes, Verbal Permission Granted  Share Information with NAME: Lara Mulch  Permission granted to share info w AGENCY: [place] granted to share info w Relationship: mother  Permission granted to share info w Contact Information: [phone]  Emotional Assessment Appearance:: Appears stated age Attitude/Demeanor/Rapport: Gracious, Engaged Affect (typically observed): Accepting Orientation: : Oriented to Self, Oriented to Place, Oriented to  Time, Oriented to Situation   Psych Involvement: No (comment)  Admission diagnosis:  Hypoxia [R09.02] NSTEMI (non-ST elevated myocardial infarction) ([place]) [I21.4] AKI (acute kidney injury) ([place]) [N17.9] New onset of congestive heart failure ([place]) [I50.9] Acute congestive heart failure, unspecified heart failure type (HCC) [I50.9] Acute CHF (congestive heart failure) ([place]) [I50.9] Patient Active Problem List   Diagnosis Date Noted   New onset of congestive heart failure ([place]) 09/07/2021   Acute CHF (congestive heart failure) ([place]) 09/07/2021   Left bundle branch block 05/09/2021   Essential hypertension 05/09/2021   Preoperative clearance 05/09/2021   Malignant neoplasm of prostate ([place]) 02/23/2021   PCP:  Bonnita Hollow, MD Pharmacy:   [facility] [address] [place] [address] Phone: [phone] Fax: [phone]     Social Determinants of Health (SDOH) Interventions    Readmission Risk Interventions No flowsheet  data found.

## 2021-09-12 ENCOUNTER — Encounter: Payer: Self-pay | Admitting: Physician Assistant

## 2021-09-12 ENCOUNTER — Other Ambulatory Visit (HOSPITAL_COMMUNITY): Payer: Self-pay

## 2021-09-18 NOTE — Progress Notes (Signed)
ADVANCED HF CLINIC CONSULT NOTE   Primary Care: Bonnita Hollow, MD Primary Cardiologist: Dr. 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  He was seen by Dr Gwenlyn Found 05/09/2021 for preop eval before radioactive seed implant and circumcision. He had LBBB of unknown duration, BP well-controlled. No further workup pre-procedure.  Admitted 09/38 for a/c systolic HF. Echo EF 20-25%. LV dilated (6.5cm) with severe central MR. Global HK with ? AK of anteroseptum.  Diuresed with IV lasix. Initiated on GDMT with digoxin, spironolactone, Entresto and Farxiga. Frequent PVCs, started on amio w/ reduction in PVC burden. R/LHC showed minimal nonobstructive CAD, RA mean 1 mmHg, PCWP 5 mmHg, Fick CO 6.2/CI 3.1. cMRI showed LVEF 14%, diffuse HK, mild/mod RV dilation w/ severe systolic dysfunction, RVEF 15%, small nonspecific basal inferoseptal RV insertion site LGE, mild elevation in ECV, not suggestive of cardiac amyloidosis. 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 LifeVest placed, Entresto held with low BP at discharge, weight 187 lbs.  Today he returns for post hospital HF follow up with his mother. Overall feeling fine. He has SOB with inclines  but does OK walking on flat ground. Denies palpitations, CP, dizziness, edema, or PND/Orthopnea. Says he thinks he snores. Appetite ok. No fever or chills. Weight at home 187-191 pounds. Taking all medications. Works as a Freight forwarder, "I sit and drive." Wants to know if he can go back to work. Drinks 2 cups of coffee/day. Urine clear/yellow.   Cardiac Studies: - Cardiac MRI (11/22):  LVEF 14%, diffuse HK, mild/mod RV dilation w/ severe systolic dysfunction, RVEF 15%, small nonspecific basal inferoseptal RV insertion site LGE, mild elevation in ECV, not suggestive of cardiac amyloidosis.   - Echo EF 20-25%. LV dilated (6.5cm)  with severe central MR. Global HK with ? AK of anteroseptum   - RHC/LHC with minimal obstructive CAD, low filling pressure, normal output and severe NICM   Review of Systems: [y] = yes, [ ]  = no   General: Weight gain [ ] ; Weight loss [ ] ; Anorexia [ ] ; Fatigue [ ] ; Fever [ ] ; Chills [ ] ; Weakness Blue.Reese ]  Cardiac: Chest pain/pressure [ ] ; Resting SOB [ ] ; Exertional SOB [ ] ; Orthopnea [ ] ; Pedal Edema [ ] ; Palpitations [ ] ; Syncope [ ] ; Presyncope [ ] ; Paroxysmal nocturnal dyspnea[ ]   Pulmonary: Cough [ ] ; Wheezing[ ] ; Hemoptysis[ ] ; Sputum [ ] ; Snoring [ ]   GI: Vomiting[ ] ; Dysphagia[ ] ; Melena[ ] ; Hematochezia [ ] ; Heartburn[ ] ; Abdominal pain [ ] ; Constipation [ ] ; Diarrhea [ ] ; BRBPR [ ]   GU: Hematuria[ ] ; Dysuria [ ] ; Nocturia[ ]   Vascular: Pain in legs with walking [ ] ; Pain in feet with lying flat [ ] ; Non-healing sores [ ] ; Stroke [ ] ; TIA [ ] ; Slurred speech [ ] ;  Neuro: Headaches[ ] ; Vertigo[ ] ; Seizures[ ] ; Paresthesias[ ] ;Blurred vision [ ] ; Diplopia [ ] ; Vision changes [ ]   Ortho/Skin: Arthritis [ ] ; Joint pain [ ] ; Muscle pain [ ] ; Joint swelling [ ] ; Back Pain [ ] ; Rash [ ]   Psych: Depression[ ] ; Anxiety[ ]   Heme: Bleeding problems [ ] ; Clotting disorders [ ] ; Anemia [ ]   Endocrine: Diabetes [ ] ; Thyroid dysfunction[ ]   Past Medical History:  Diagnosis Date   Arthritis    Emphysema of lung (HCC)    GERD (gastroesophageal reflux disease)  Hypertension    Hypospadias, balanic    chronic   LBBB (left bundle branch block)    newly dx by ekg done 04-26-2021 for surgery on 06-05-2021,  pt stated he not had a previous ekg before, so pt sent for cardiac clearance prior to surgery;  cardiologist , dr berry, office note clearance in epic 05-09-2021 gave pt clearance with no further work-up needed   Lower urinary tract symptoms (LUTS)    Prostate cancer [facility])    urologist-- dr Jeffie Pollock--- dx by bx 01-08-2021, Gleason 3+4, T2a   Substance abuse ([place])     Current Outpatient  Medications  Medication Sig Dispense Refill   amiodarone (PACERONE) 200 MG tablet Take 1 tablet (200 mg total) by mouth 2 (two) times daily. 60 tablet 6   atorvastatin (LIPITOR) 40 MG tablet Take 1 tablet (40 mg total) by mouth daily. 40 tablet 6   dapagliflozin propanediol (FARXIGA) 10 MG TABS tablet Take 1 tablet (10 mg total) by mouth daily. 30 tablet 6   digoxin (LANOXIN) 0.125 MG tablet Take 1 tablet (0.125 mg total) by mouth daily. 30 tablet 6   DULoxetine (CYMBALTA) 60 MG capsule Take 60 mg by mouth daily.     magnesium oxide (MAG-OX) 400 MG tablet Take 1/2 tablet (200 mg total) by mouth daily. 30 tablet 6   Multiple Vitamin (MULTIVITAMIN ADULT PO) Take by mouth.     omeprazole (PRILOSEC) 40 MG capsule Take 1 capsule by mouth daily.     spironolactone (ALDACTONE) 25 MG tablet Take 1 tablet (25 mg total) by mouth daily. 30 tablet 6   tamsulosin (FLOMAX) 0.4 MG CAPS capsule Take 1 capsule (0.4 mg total) by mouth daily after supper. 30 capsule 3   triamcinolone (KENALOG) 0.025 % ointment Apply topically daily as needed (itching, rash).     valACYclovir (VALTREX) 500 MG tablet Take 500 mg by mouth 2 (two) times daily as needed.     No current facility-administered medications for this encounter.   No Known Allergies  Social History   Socioeconomic History   Marital status: Single    Spouse name: Not on file   Number of children: 1   Years of education: Not on file   Highest education level: Not on file  Occupational History   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  Tobacco Use   Smoking status: Former    Packs/day: 1.50    Years: 30.00    Pack years: 45.00    Types: Cigarettes    Quit date: 2009    Years since quitting: 13.9   Smokeless tobacco: Never  Vaping Use   Vaping Use: Never used  Substance and Sexual Activity   Alcohol use: Not Currently   Drug use: Not Currently    Types: Marijuana, "Crack" cocaine   Sexual activity: Yes  Other Topics  Concern   Not on file  Social History Narrative   Not on file   Social Determinants of Health   Financial Resource Strain: Low Risk    Difficulty of Paying Living Expenses: Not hard at all  Food Insecurity: No Food Insecurity   Worried About Charity fundraiser in the Last Year: Never true   [place] in the Last Year: Never true  Transportation Needs: No Transportation Needs   Lack of Transportation (Medical): No   Lack of Transportation (Non-Medical): No  Physical Activity: Inactive   Days of Exercise per Week: 0 days   Minutes  of Exercise per Session: 0 min  Stress: No Stress Concern Present   Feeling of Stress : Not at all  Social Connections: Socially Isolated   Frequency of Communication with Friends and Family: Three times a week   Frequency of Social Gatherings with Friends and Family: Three times a week   Attends Religious Services: Never   Active Member of Clubs or Organizations: No   Attends Archivist Meetings: Never   Marital Status: Never married  Human resources officer Violence: Not At Risk   Fear of Current or Ex-Partner: No   Emotionally Abused: No   Physically Abused: No   Sexually Abused: No    Family History  Problem Relation Age of Onset   Stroke Father    Lung cancer Maternal Aunt    Diabetes Maternal Uncle    Brain cancer Maternal Uncle    Lung cancer Maternal Uncle    Lung cancer Maternal Uncle    Cancer Paternal Grandmother    Diabetes Paternal Grandfather    Colon cancer Neg Hx    Esophageal cancer Neg Hx    Stomach cancer Neg Hx    Pancreatic cancer Neg Hx    Colon polyps Neg Hx    Rectal cancer Neg Hx    BP 110/78   Pulse 83   Wt 87.4 kg   SpO2 97%   BMI 29.28 kg/m   Wt Readings from Last 3 Encounters:  09/19/21 87.4 kg  09/11/21 85.1 kg  08/15/21 86.6 kg   PHYSICAL EXAM: General:  NAD. No resp difficulty HEENT: Normal Neck: Supple. No JVD. Carotids 2+ bilat; no bruits. No lymphadenopathy or thryomegaly  appreciated. Cor: PMI nondisplaced. Regular rate & rhythm. No rubs, gallops or murmurs. Lungs: Clear, LifeVest on Abdomen: Soft, nontender, nondistended. No hepatosplenomegaly. No bruits or masses. Good bowel sounds. Extremities: No cyanosis, clubbing, rash, edema Neuro: Alert & oriented x 3, cranial nerves grossly intact. Moves all 4 extremities w/o difficulty. Affect pleasant.  ECG: SR PVCs LBBB (personally reviewed).  [place]oll interrogation:  No treatments, average HR 87 bpm, 4369 steps (personally reviewed).  ASSESSMENT & PLAN:  1. Chronic systolic HF - Echo EF 10-93%. LV dilated (6.5cm) with severe central MR. Global HK with ? AK of anteroseptum  - RHC/LHC with minimal obstructive CAD, low filling pressure, normal output and severe NICM - cMRI LVEF 14%, diffuse HK, mild/mod RV dilation w/ severe systolic dysfunction, RVEF 15%, small nonspecific basal inferoseptal RV insertion site LGE, mild elevation in ECV, not suggestive of cardiac amyloidosis. 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 - NYHA II today, volume looks good, does not need loop diuretics.  - Continue digoxin 0.125 mg daily. - Start Entresto 24/26 mg bid. - Continue spiro 25 mg daily.   - Continue Farxiga 10 mg daily.  - Continue LifeVest - Plan repeat echo in 3 months. - BMET and dig today, repeat BMET in 10 days.   2. Frequent PVCs - SR w/ occasional PVC on ECG today. - Continue amiodarone 200 mg bid (Discussed w/ Dr. Haroldine Laws).  - keep K> 4.0 Mg > 2.0 - Continue LifeVest - Arrange home sleep study - Labs today.   3. Severe mitral regurgitation - Functional - Should improve with treatment of HF and possibly CRT   4. COPD - Quit smoking 2009   5. Pre-Diabetes - A1C 6.0 - On Farxiga.   6. H/o prostate CA - s/p radioactive seeds - Has had some dysuria since 8/22, watch for  s/s of UTI w/ Wilder Glade.   7. Snoring - Home sleep study.  RTW: Discussed with Dr. Haroldine Laws, would ideally wait 3 months to see  stability on medications, but if he needs to go back sooner, 1 month stable on medications would be acceptable. 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    Follow up with APP in 4 weeks and Dr. Haroldine Laws + echo in 12 weeks.  Allena Katz, FNP-BC 09/19/21

## 2021-09-19 ENCOUNTER — Encounter (HOSPITAL_COMMUNITY): Payer: Self-pay

## 2021-09-19 ENCOUNTER — Other Ambulatory Visit (HOSPITAL_COMMUNITY): Payer: Self-pay

## 2021-09-19 ENCOUNTER — Encounter: Payer: Self-pay | Admitting: Internal Medicine

## 2021-09-19 ENCOUNTER — Telehealth (HOSPITAL_COMMUNITY): Payer: Self-pay | Admitting: Surgery

## 2021-09-19 ENCOUNTER — Ambulatory Visit (HOSPITAL_COMMUNITY)
Admit: 2021-09-19 | Discharge: 2021-09-19 | Disposition: A | Discharge status: Home / Self Care | Payer: 59 | Attending: Family Medicine | Admitting: Family Medicine

## 2021-09-19 ENCOUNTER — Other Ambulatory Visit: Payer: Self-pay

## 2021-09-19 VITALS — BP 110/78 | HR 83 | Wt 192.6 lb

## 2021-09-19 DIAGNOSIS — I447 Left bundle-branch block, unspecified: Secondary | ICD-10-CM | POA: Diagnosis not present

## 2021-09-19 DIAGNOSIS — J449 Chronic obstructive pulmonary disease, unspecified: Secondary | ICD-10-CM | POA: Diagnosis not present

## 2021-09-19 DIAGNOSIS — J439 Emphysema, unspecified: Secondary | ICD-10-CM | POA: Diagnosis not present

## 2021-09-19 DIAGNOSIS — F1911 Other psychoactive substance abuse, in remission: Secondary | ICD-10-CM | POA: Diagnosis not present

## 2021-09-19 DIAGNOSIS — I428 Other cardiomyopathies: Secondary | ICD-10-CM | POA: Insufficient documentation

## 2021-09-19 DIAGNOSIS — K219 Gastro-esophageal reflux disease without esophagitis: Secondary | ICD-10-CM | POA: Diagnosis not present

## 2021-09-19 DIAGNOSIS — Z87891 Personal history of nicotine dependence: Secondary | ICD-10-CM | POA: Insufficient documentation

## 2021-09-19 DIAGNOSIS — I493 Ventricular premature depolarization: Secondary | ICD-10-CM | POA: Diagnosis not present

## 2021-09-19 DIAGNOSIS — Z8546 Personal history of malignant neoplasm of prostate: Secondary | ICD-10-CM | POA: Insufficient documentation

## 2021-09-19 DIAGNOSIS — I5022 Chronic systolic (congestive) heart failure: Secondary | ICD-10-CM | POA: Diagnosis not present

## 2021-09-19 DIAGNOSIS — Z79899 Other long term (current) drug therapy: Secondary | ICD-10-CM | POA: Diagnosis not present

## 2021-09-19 DIAGNOSIS — R7303 Prediabetes: Secondary | ICD-10-CM | POA: Insufficient documentation

## 2021-09-19 DIAGNOSIS — I340 Nonrheumatic mitral (valve) insufficiency: Secondary | ICD-10-CM | POA: Diagnosis not present

## 2021-09-19 DIAGNOSIS — I110 Hypertensive heart disease with heart failure: Secondary | ICD-10-CM | POA: Diagnosis present

## 2021-09-19 DIAGNOSIS — R300 Dysuria: Secondary | ICD-10-CM | POA: Diagnosis not present

## 2021-09-19 DIAGNOSIS — Z7984 Long term (current) use of oral hypoglycemic drugs: Secondary | ICD-10-CM | POA: Insufficient documentation

## 2021-09-19 DIAGNOSIS — I2510 Atherosclerotic heart disease of native coronary artery without angina pectoris: Secondary | ICD-10-CM | POA: Diagnosis not present

## 2021-09-19 DIAGNOSIS — I959 Hypotension, unspecified: Secondary | ICD-10-CM | POA: Insufficient documentation

## 2021-09-19 DIAGNOSIS — R0683 Snoring: Secondary | ICD-10-CM | POA: Insufficient documentation

## 2021-09-19 LAB — BASIC METABOLIC PANEL
Anion gap: 7 (ref 5–15)
BUN: 11 mg/dL (ref 6–20)
CO2: 28 mmol/L (ref 22–32)
Calcium: 9.4 mg/dL (ref 8.9–10.3)
Chloride: 100 mmol/L (ref 98–111)
Creatinine, Ser: 1.19 mg/dL (ref 0.61–1.24)
GFR, Estimated: >60 mL/min (ref >60)
Glucose, Bld: 104 mg/dL — ABNORMAL HIGH (ref 70–99)
Potassium: 4.8 mmol/L (ref 3.5–5.1)
Sodium: 135 mmol/L (ref 135–145)

## 2021-09-19 LAB — DIGOXIN LEVEL: Digoxin Level: 1 ng/mL (ref 0.8–2.0)

## 2021-09-19 LAB — MAGNESIUM: Magnesium: 2.4 mg/dL (ref 1.7–2.4)

## 2021-09-19 MED ORDER — ENTRESTO 24-26 MG PO TABS: 1.0000 | ORAL_TABLET | Freq: Two times a day (BID) | ORAL | 1 refills | Status: DC

## 2021-09-19 MED ORDER — DIGOXIN 125 MCG PO TABS: 0.0625 mg | ORAL_TABLET | Freq: Every day | ORAL | 6 refills | Status: DC

## 2021-09-19 MED ORDER — DAPAGLIFLOZIN PROPANEDIOL 10 MG PO TABS: 10.0000 mg | ORAL_TABLET | Freq: Every day | ORAL | 1 refills | Status: DC

## 2021-09-19 MED ORDER — AMIODARONE HCL 200 MG PO TABS: 200.0000 mg | ORAL_TABLET | Freq: Every day | ORAL | 6 refills | Status: DC

## 2021-09-19 MED ORDER — AMIODARONE HCL 200 MG PO TABS: 200.0000 mg | ORAL_TABLET | Freq: Two times a day (BID) | ORAL | 6 refills | Status: DC

## 2021-09-19 NOTE — Telephone Encounter (Signed)
I attempted to call patient at both numbers listed to retrieve the serial number from the Sleep study device that he was given in clinic today for registration.  I asked that he call me back when he receives the message and before completing the study.

## 2021-09-19 NOTE — Patient Instructions (Signed)
Thank you for coming in  [place]  EKG was done today  Labs were done, if any labs are abnormal the clinic will call you  START Entresto 24/26 mg 1 tablet, two times daily  You were setup for a at home sleep study today  Your physician recommends that you schedule a follow-up appointment in: 4 weeks and 12 weeks with echocardiogram with Dr. Haroldine Laws  Your physician recommends that you return for lab work in:  10 days  At the [facility], you and your health needs are our priority. As part of our continuing mission to provide you with exceptional heart care, we have created designated Provider Care Teams. These Care Teams include your primary Cardiologist (physician) and Advanced Practice Providers (APPs- Physician Assistants and Nurse Practitioners) who all work together to provide you with the care you need, when you need it.   You may see any of the following providers on your designated Care Team at your next follow up: Dr Glori Bickers Dr Haynes Kerns, NP Lyda Jester, [place] [facility] [place], [place] Audry Riles, PharmD   Please be sure to bring in all your medications bottles to every appointment.    If you have any questions or concerns before your next appointment please send [place] a message through [place] or call our office at [phone].    TO LEAVE A MESSAGE FOR THE NURSE SELECT OPTION 2, PLEASE LEAVE A MESSAGE INCLUDING: YOUR NAME DATE OF BIRTH CALL BACK NUMBER REASON FOR CALL**this is important as we prioritize the call backs  YOU WILL RECEIVE A CALL BACK THE SAME DAY AS LONG AS YOU CALL BEFORE 4:00 PM

## 2021-09-19 NOTE — Telephone Encounter (Signed)
Late entry added for Madlyn Frankel RN completing the [facility] assessment.  Date:  09/19/2021 STOP BANG RISK ASSESSMENT S (snore) Have you been told that you snore?     YES   T (tired) Are you often tired, fatigued, or sleepy during the day?   YES  O (obstruction) Do you stop breathing, choke, or gasp during sleep? NO   P (pressure) Do you have or are you being treated for high blood pressure? YES   B (BMI) Is your body index greater than 35 kg/m? NO   A (age) Are you 56 years old or older? YES   N (neck) Do you have a neck circumference greater than 16 inches?   NO   G (gender) Are you a male? YES   TOTAL STOP/BANG "YES" ANSWERS 5                                                                       For Office Use Only              Procedure Order Form    YES to 3+ Stop Bang questions OR two clinical symptoms - patient qualifies for WatchPAT (CPT 95800)

## 2021-09-21 ENCOUNTER — Telehealth (HOSPITAL_COMMUNITY): Payer: Self-pay | Admitting: Surgery

## 2021-09-21 ENCOUNTER — Encounter (INDEPENDENT_AMBULATORY_CARE_PROVIDER_SITE_OTHER): Payer: 59 | Admitting: Cardiology

## 2021-09-21 DIAGNOSIS — I5022 Chronic systolic (congestive) heart failure: Secondary | ICD-10-CM

## 2021-09-21 NOTE — Telephone Encounter (Signed)
Patient notified that he can proceed with the Home sleep study as ordered.  Insurance precert not required per [place].

## 2021-09-22 NOTE — Procedures (Signed)
     Sleep Study Report  Patient Information  Study Date: 09/21/21 Patient Name: Ruben Duffy Patient ID: [id number] Birth Date: May 01, 2066 Age: 55 Gender: Male Referring Physician: Glori Bickers, MD  TEST DESCRIPTION: Home sleep apnea testing was completed using the WatchPat, a Type 1 device, utilizing peripheral arterial tonometry (PAT), chest movement, actigraphy, pulse oximetry, pulse rate, body position and snore. AHI was calculated with apnea and hypopnea using valid sleep time as the denominator. RDI includes apneas, hypopneas, and RERAs. The data acquired and the scoring of sleep and all associated events were performed in accordance with the recommended standards and specifications as outlined in the AASM Manual for the Scoring of Sleep and Associated Events 2.2.0 (2015).  FINDINGS: 1. No evidence of Obstructive Sleep Apnea with AHI 0.6/hr. 2. No Central Sleep Apnea. 3. Oxygen desaturations as low as 87%. 4. Mild snoring was present. O2 sats were < 88% for 0.4 minutes. 5. Total sleep time was 6 hrs and 21 min. 6. 16.7% of total sleep time was spent in REM sleep. 7. Shortened sleep onset latency at 6 min. 8. Shortened REM sleep onset latency at 49 min. 9. Total awakenings were 8.  DIAGNOSIS: Normal study with no significant sleep disordered breathing.  RECOMMENDATIONS: 1. Normal study with no significant sleep disordered breathing.  2. Healthy sleep recommendations include: adequate nightly sleep (normal 7-9 hrs/night), avoidance of caffeine after noon and alcohol near bedtime, and maintaining a sleep environment that is cool, dark and quiet.  3. Weight loss for overweight patients is recommended.  4. Snoring recommendations include: weight loss where appropriate, side sleeping, and avoidance of alcohol before bed.  5. Operation of motor vehicle or dangerous equipment must be avoided when feeling drowsy, excessively sleepy, or mentally fatigued.  6. An ENT  consultation which may be useful for specific causes of and possible treatment of bothersome snoring .  7. Weight loss may be of benefit in reducing the severity of snoring.   Signature: Electronically Signed: 09/23/21 Fransico Him, MD; [facility]; [place], [place] Board of Sleep Medicine

## 2021-09-25 ENCOUNTER — Other Ambulatory Visit (HOSPITAL_COMMUNITY)
Admission: RE | Admit: 2021-09-25 | Discharge: 2021-09-25 | Disposition: A | Discharge status: Home / Self Care | Payer: 59 | Source: Ambulatory Visit | Attending: Family Medicine | Admitting: Family Medicine

## 2021-09-25 DIAGNOSIS — I5022 Chronic systolic (congestive) heart failure: Secondary | ICD-10-CM | POA: Diagnosis present

## 2021-09-25 LAB — DIGOXIN LEVEL: Digoxin Level: 0.3 ng/mL — ABNORMAL LOW (ref 0.8–2.0)

## 2021-09-26 ENCOUNTER — Ambulatory Visit: Payer: 59

## 2021-09-26 DIAGNOSIS — I493 Ventricular premature depolarization: Secondary | ICD-10-CM

## 2021-09-26 DIAGNOSIS — R0683 Snoring: Secondary | ICD-10-CM

## 2021-09-26 DIAGNOSIS — I5022 Chronic systolic (congestive) heart failure: Secondary | ICD-10-CM

## 2021-09-28 ENCOUNTER — Other Ambulatory Visit: Payer: Self-pay

## 2021-09-28 DIAGNOSIS — I5022 Chronic systolic (congestive) heart failure: Secondary | ICD-10-CM

## 2021-09-28 DIAGNOSIS — I493 Ventricular premature depolarization: Secondary | ICD-10-CM

## 2021-09-28 DIAGNOSIS — R0683 Snoring: Secondary | ICD-10-CM

## 2021-10-17 ENCOUNTER — Telehealth: Payer: Self-pay | Admitting: *Deleted

## 2021-10-17 DIAGNOSIS — R0683 Snoring: Secondary | ICD-10-CM

## 2021-10-17 DIAGNOSIS — I10 Essential (primary) hypertension: Secondary | ICD-10-CM

## 2021-10-17 NOTE — Telephone Encounter (Signed)
The patient has been notified of the result. Left detailed message on voicemail and informed patient to call back.   Marolyn Hammock, [place] 10/17/2021 6:20 PM.

## 2021-10-17 NOTE — Progress Notes (Signed)
ADVANCED HF CLINIC NOTE   Primary Care: Bonnita Hollow, MD HF Cardiologist: Dr. 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  He was seen by Dr Gwenlyn Found 05/09/2021 for preop eval before radioactive seed implant and circumcision. He had LBBB of unknown duration, BP well-controlled. No further workup pre-procedure.  Admitted 35/00 for a/c systolic HF. Echo EF 20-25%. LV dilated (6.5cm) with severe central MR. Global HK with ? AK of anteroseptum.  Diuresed with IV lasix. Initiated on GDMT with digoxin, spironolactone, Entresto and Farxiga. Frequent PVCs, started on amio w/ reduction in PVC burden. R/LHC showed minimal nonobstructive CAD, RA mean 1 mmHg, PCWP 5 mmHg, Fick CO 6.2/CI 3.1. cMRI showed LVEF 14%, diffuse HK, mild/mod RV dilation w/ severe systolic dysfunction, RVEF 15%, small nonspecific basal inferoseptal RV insertion site LGE, mild elevation in ECV, not suggestive of cardiac amyloidosis. 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 LifeVest placed, Entresto held with low BP at discharge, weight 187 lbs.  Today he returns for HF follow up. Overall feeling fine. Has some SOB walking up inclines but otherwise no issues. Denies palpitations, abnormal bleeding, CP, dizziness, edema, or PND/Orthopnea. Appetite ok. No fever or chills. Weight at home 190 pounds. Taking all medications. No issues with LifeVest. Wants to know when he can go back to work, works as a Freight forwarder. Employer will not clear him to come back until LifeVest is off.   Cardiac Studies: - Cardiac MRI (11/22):  LVEF 14%, diffuse HK, mild/mod RV dilation w/ severe systolic dysfunction, RVEF 15%, small nonspecific basal inferoseptal RV insertion site LGE, mild elevation in ECV, not suggestive of cardiac amyloidosis.   - Echo EF 20-25%. LV dilated (6.5cm) with severe central MR. Global HK  with ? AK of anteroseptum   - RHC/LHC with minimal obstructive CAD, low filling pressure, normal output and severe NICM  Past Medical History:  Diagnosis Date   Arthritis    Emphysema of lung ([place])    GERD (gastroesophageal reflux disease)    Hypertension    Hypospadias, balanic    chronic   LBBB (left bundle branch block)    newly dx by ekg done 04-26-2021 for surgery on 06-05-2021,  pt stated he not had a previous ekg before, so pt sent for cardiac clearance prior to surgery;  cardiologist , dr berry, office note clearance in epic 05-09-2021 gave pt clearance with no further work-up needed   Lower urinary tract symptoms (LUTS)    Prostate cancer [facility])    urologist-- dr Jeffie Pollock--- dx by bx 01-08-2021, Gleason 3+4, T2a   Substance abuse ([place])     Current Outpatient Medications  Medication Sig Dispense Refill   amiodarone (PACERONE) 200 MG tablet Take 1 tablet (200 mg total) by mouth 2 (two) times daily. 60 tablet 6   atorvastatin (LIPITOR) 40 MG tablet Take 1 tablet (40 mg total) by mouth daily. 40 tablet 6   dapagliflozin propanediol (FARXIGA) 10 MG TABS tablet Take 1 tablet (10 mg total) by mouth daily. 90 tablet 1   digoxin (LANOXIN) 0.125 MG tablet Take 0.5 tablets (0.0625 mg total) by mouth daily. 30 tablet 6   DULoxetine (CYMBALTA) 60 MG capsule Take 60 mg by mouth daily.     magnesium oxide (MAG-OX) 400 MG tablet Take 1/2 tablet (200 mg total) by mouth daily. 30 tablet 6  Multiple Vitamin (MULTIVITAMIN ADULT PO) Take by mouth.     omeprazole (PRILOSEC) 40 MG capsule Take 1 capsule by mouth daily.     sacubitril-valsartan (ENTRESTO) 24-26 MG Take 1 tablet by mouth 2 (two) times daily. 180 tablet 1   spironolactone (ALDACTONE) 25 MG tablet Take 1 tablet (25 mg total) by mouth daily. 30 tablet 6   tamsulosin (FLOMAX) 0.4 MG CAPS capsule Take 1 capsule (0.4 mg total) by mouth daily after supper. 30 capsule 3   triamcinolone (KENALOG) 0.025 % ointment Apply topically daily as needed  (itching, rash).     valACYclovir (VALTREX) 500 MG tablet Take 500 mg by mouth 2 (two) times daily as needed.     No current facility-administered medications for this encounter.   No Known Allergies  Social History   Socioeconomic History   Marital status: Single    Spouse name: Not on file   Number of children: 1   Years of education: Not on file   Highest education level: Not on file  Occupational History   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  Tobacco Use   Smoking status: Former    Packs/day: 1.50    Years: 30.00    Pack years: 45.00    Types: Cigarettes    Quit date: 2009    Years since quitting: 14.0   Smokeless tobacco: Never  Vaping Use   Vaping Use: Never used  Substance and Sexual Activity   Alcohol use: Not Currently   Drug use: Not Currently    Types: Marijuana, "Crack" cocaine   Sexual activity: Yes  Other Topics Concern   Not on file  Social History Narrative   Not on file   Social Determinants of Health   Financial Resource Strain: Low Risk    Difficulty of Paying Living Expenses: Not hard at all  Food Insecurity: No Food Insecurity   Worried About Charity fundraiser in the Last Year: Never true   [place] in the Last Year: Never true  Transportation Needs: No Transportation Needs   Lack of Transportation (Medical): No   Lack of Transportation (Non-Medical): No  Physical Activity: Inactive   Days of Exercise per Week: 0 days   Minutes of Exercise per Session: 0 min  Stress: No Stress Concern Present   Feeling of Stress : Not at all  Social Connections: Socially Isolated   Frequency of Communication with Friends and Family: Three times a week   Frequency of Social Gatherings with Friends and Family: Three times a week   Attends Religious Services: Never   Active Member of Clubs or Organizations: No   Attends Music therapist: Never   Marital Status: Never married  Human resources officer Violence: Not At  Risk   Fear of Current or Ex-Partner: No   Emotionally Abused: No   Physically Abused: No   Sexually Abused: No   Family History  Problem Relation Age of Onset   Stroke Father    Lung cancer Maternal Aunt    Diabetes Maternal Uncle    Brain cancer Maternal Uncle    Lung cancer Maternal Uncle    Lung cancer Maternal Uncle    Cancer Paternal Grandmother    Diabetes Paternal Grandfather    Colon cancer Neg Hx    Esophageal cancer Neg Hx    Stomach cancer Neg Hx    Pancreatic cancer Neg Hx    Colon polyps Neg Hx    Rectal  cancer Neg Hx    BP 118/72    Pulse 79    Ht 5\' 8"  (1.727 m)    Wt 88.9 kg (196 lb)    SpO2 96%    BMI 29.80 kg/m   Wt Readings from Last 3 Encounters:  10/18/21 88.9 kg (196 lb)  09/19/21 87.4 kg (192 lb 9.6 oz)  09/11/21 85.1 kg (187 lb 11.2 oz)   PHYSICAL EXAM: General:  NAD. No resp difficulty HEENT: Normal Neck: Supple. No JVD. Carotids 2+ bilat; no bruits. No lymphadenopathy or thryomegaly appreciated. Cor: PMI nondisplaced. Regular rate & rhythm. No rubs, gallops or murmurs. Lungs: Clear Abdomen: Soft, nontender, nondistended. No hepatosplenomegaly. No bruits or masses. Good bowel sounds. Extremities: No cyanosis, clubbing, rash, edema Neuro: Alert & oriented x 3, cranial nerves grossly intact. Moves all 4 extremities w/o difficulty. Affect pleasant.  Bedside echo today by Dr. Haroldine Laws 10/18/21: EF 25-30%, improving.  Zoll interrogation:  No treatments, average HR 81 bpm, 3388 steps average daily (personally reviewed).  ASSESSMENT & PLAN:  1. Chronic systolic HF - Echo EF 19-41%. LV dilated (6.5cm) with severe central MR. Global HK with ? AK of anteroseptum  - RHC/LHC with minimal obstructive CAD, low filling pressure, normal output and severe NICM - cMRI LVEF 14%, diffuse HK, mild/mod RV dilation w/ severe systolic dysfunction, RVEF 15%, small nonspecific basal inferoseptal RV insertion site LGE, mild elevation in ECV, not suggestive of cardiac  amyloidosis. 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 - Bedside echo today 10/18/21, EF 25-30%, completed by DB - NYHA II today, volume looks good, does not need loop diuretics.  - Start carvedilol 3.125 mg bid. - Continue digoxin 0.0625 mg daily. - Continue Entresto 24/26 mg bid. - Continue spiro 25 mg daily.   - Continue Farxiga 10 mg daily.  - Repeat formal echo in 2 months. - BMET and dig today.   2. Frequent PVCs - Continue amiodarone 200 mg bid (Discussed w/ Dr. Haroldine Laws).  - Add beta blocker as above. - keep K> 4.0 Mg > 2.0 - Continue LifeVest. - Labs today.   3. Severe mitral regurgitation - Functional. - Should improve with treatment of HF and possibly CRT.   4. COPD - Quit smoking 2009.   5. Pre-Diabetes - A1C 6.0 - On Farxiga.   6. H/o prostate CA - s/p radioactive seeds. - Has had some dysuria since 8/22, watch for s/s of UTI w/ Farxiga.   7. Snoring - Home sleep study negative for OSA, has in-lab PSG arranged.  RTW: Dr. Haroldine Laws thoroughly discussed potential risks of removing LifeVest and return to work. Patient wants vest off but will continue with LifeVest for now until he verifies with employer if he can return to work with vest. Paperwork given to Ruben Duffy until patient notifies clinic of employer's decision.  Follow up with Dr. Haroldine Laws + echo in 2 months as scheduled.  Ruben Katz, FNP-BC 10/18/21  Patient seen and examined with the above-signed Advanced Practice Provider and/or Housestaff. I personally reviewed laboratory data, imaging studies and relevant notes. I independently examined the patient and formulated the important aspects of the plan. I have edited the note to reflect any of my changes or salient points. I have personally discussed the plan with the patient and/or family.  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 Returns for post-hospital f/u. Wearing LifeVest. Feels much better. Doing all  activities without undue SOB or CP. No edema,  orthopnea or PND. No LifeVest activations, Very eager to return to work.   PVCs suppressed on ECG  General:  Well appearing. No resp difficulty HEENT: normal Neck: supple. no JVD. Carotids 2+ bilat; no bruits. No lymphadenopathy or thryomegaly appreciated. Cor: PMI nondisplaced. Regular rate & rhythm. No rubs, gallops or murmurs. Lungs: clear Abdomen: soft, nontender, nondistended. No hepatosplenomegaly. No bruits or masses. Good bowel sounds. Extremities: no cyanosis, clubbing, rash, edema Neuro: alert & orientedx3, cranial nerves grossly intact. moves all 4 extremities w/o difficulty. Affect pleasant  Functionally much improved NYHA I-II. No events on LifeVest. I did bedside echo and EF 25-30%. Continue to titrated GDMT and suspect EF will continue to improve with time and PVC suppression. I think he is stable to return to work with rest breaks as needed,. I have suggested that he continue to wear his Lifevest (if his jb will permit him) until EF > 35%.  Total time spent 45 minutes. Over half that time spent discussing above.   Glori Bickers, MD  11:19 PM

## 2021-10-17 NOTE — Telephone Encounter (Signed)
-----   Message from Sueanne Margarita, MD sent at 09/22/2021  8:08 PM EST ----- Normal home sleep study so in lab PSG will be ordered

## 2021-10-18 ENCOUNTER — Ambulatory Visit (HOSPITAL_COMMUNITY)
Admission: RE | Admit: 2021-10-18 | Discharge: 2021-10-18 | Disposition: A | Discharge status: Home / Self Care | Payer: 59 | Source: Ambulatory Visit | Attending: Family Medicine | Admitting: Family Medicine

## 2021-10-18 ENCOUNTER — Encounter (HOSPITAL_COMMUNITY): Payer: Self-pay | Admitting: Internal Medicine

## 2021-10-18 ENCOUNTER — Encounter (HOSPITAL_COMMUNITY): Payer: Self-pay

## 2021-10-18 ENCOUNTER — Other Ambulatory Visit: Payer: Self-pay

## 2021-10-18 VITALS — BP 118/72 | HR 79 | Ht 68.0 in | Wt 196.0 lb

## 2021-10-18 DIAGNOSIS — I447 Left bundle-branch block, unspecified: Secondary | ICD-10-CM | POA: Insufficient documentation

## 2021-10-18 DIAGNOSIS — I5022 Chronic systolic (congestive) heart failure: Secondary | ICD-10-CM | POA: Diagnosis not present

## 2021-10-18 DIAGNOSIS — R0683 Snoring: Secondary | ICD-10-CM | POA: Diagnosis not present

## 2021-10-18 DIAGNOSIS — J439 Emphysema, unspecified: Secondary | ICD-10-CM | POA: Diagnosis not present

## 2021-10-18 DIAGNOSIS — Z87891 Personal history of nicotine dependence: Secondary | ICD-10-CM | POA: Diagnosis not present

## 2021-10-18 DIAGNOSIS — K219 Gastro-esophageal reflux disease without esophagitis: Secondary | ICD-10-CM | POA: Insufficient documentation

## 2021-10-18 DIAGNOSIS — I493 Ventricular premature depolarization: Secondary | ICD-10-CM | POA: Diagnosis not present

## 2021-10-18 DIAGNOSIS — Z79899 Other long term (current) drug therapy: Secondary | ICD-10-CM | POA: Diagnosis not present

## 2021-10-18 DIAGNOSIS — I340 Nonrheumatic mitral (valve) insufficiency: Secondary | ICD-10-CM | POA: Insufficient documentation

## 2021-10-18 DIAGNOSIS — Z8546 Personal history of malignant neoplasm of prostate: Secondary | ICD-10-CM | POA: Insufficient documentation

## 2021-10-18 DIAGNOSIS — I110 Hypertensive heart disease with heart failure: Secondary | ICD-10-CM | POA: Diagnosis not present

## 2021-10-18 DIAGNOSIS — I2510 Atherosclerotic heart disease of native coronary artery without angina pectoris: Secondary | ICD-10-CM | POA: Insufficient documentation

## 2021-10-18 DIAGNOSIS — I428 Other cardiomyopathies: Secondary | ICD-10-CM | POA: Diagnosis not present

## 2021-10-18 DIAGNOSIS — R7303 Prediabetes: Secondary | ICD-10-CM | POA: Insufficient documentation

## 2021-10-18 LAB — BASIC METABOLIC PANEL
Anion gap: 6 (ref 5–15)
BUN: 15 mg/dL (ref 6–20)
CO2: 27 mmol/L (ref 22–32)
Calcium: 9.4 mg/dL (ref 8.9–10.3)
Chloride: 103 mmol/L (ref 98–111)
Creatinine, Ser: 1.07 mg/dL (ref 0.61–1.24)
GFR, Estimated: >60 mL/min (ref >60)
Glucose, Bld: 95 mg/dL (ref 70–99)
Potassium: 4.8 mmol/L (ref 3.5–5.1)
Sodium: 136 mmol/L (ref 135–145)

## 2021-10-18 LAB — DIGOXIN LEVEL: Digoxin Level: 0.4 ng/mL — ABNORMAL LOW (ref 0.8–2.0)

## 2021-10-18 LAB — MAGNESIUM: Magnesium: 2.4 mg/dL (ref 1.7–2.4)

## 2021-10-18 MED ORDER — CARVEDILOL 3.125 MG PO TABS: 3.1250 mg | ORAL_TABLET | Freq: Two times a day (BID) | ORAL | 4 refills | Status: DC

## 2021-10-18 NOTE — Patient Instructions (Addendum)
Thank you for coming in today  Labs were done today, if any labs are abnormal the clinic will call you  START Carvedilol 3.125 mg 1 tablet twice daily  Your physician recommends that you schedule a follow-up appointment in:   Keep follow up with Dr.Bensimhon  PLEASE send a message to [place] on my chart with your boss' name and phone number  At the [facility], you and your health needs are our priority. As part of our continuing mission to provide you with exceptional heart care, we have created designated Provider Care Teams. These Care Teams include your primary Cardiologist (physician) and Advanced Practice Providers (APPs- Physician Assistants and Nurse Practitioners) who all work together to provide you with the care you need, when you need it.   You may see any of the following providers on your designated Care Team at your next follow up: Dr Glori Bickers Dr Haynes Kerns, NP Lyda Jester, [place] [facility], [place] Audry Riles, PharmD   Please be sure to bring in all your medications bottles to every appointment.   If you have any questions or concerns before your next appointment please send [place] a message through [place] or call our office at [phone].    TO LEAVE A MESSAGE FOR THE NURSE SELECT OPTION 2, PLEASE LEAVE A MESSAGE INCLUDING: YOUR NAME DATE OF BIRTH CALL BACK NUMBER REASON FOR CALL**this is important as we prioritize the call backs  YOU WILL RECEIVE A CALL BACK THE SAME DAY AS LONG AS YOU CALL BEFORE 4:00 PM

## 2021-10-19 ENCOUNTER — Telehealth (HOSPITAL_COMMUNITY): Payer: Self-pay | Admitting: Licensed Clinical Social Worker

## 2021-10-19 NOTE — Telephone Encounter (Signed)
CSW consulted to help pt figure out an insurance denial he received.  CSW called pt to discuss but unable to reach- left VM requesting return call  Ruben Duffy, [facility] Desk#: [phone] Cell#: [phone]

## 2021-10-21 ENCOUNTER — Encounter (HOSPITAL_COMMUNITY): Payer: Self-pay | Admitting: Internal Medicine

## 2021-10-25 ENCOUNTER — Encounter: Payer: Self-pay | Admitting: Physician Assistant

## 2021-10-25 ENCOUNTER — Telehealth (HOSPITAL_COMMUNITY): Payer: Self-pay | Admitting: Licensed Clinical Social Worker

## 2021-10-25 NOTE — Progress Notes (Signed)
Heart and Vascular Care Navigation  10/25/2021  [place] Shaikh 08-19-1966 [id number]  Reason for Referral: Concerns with insurance   Engaged with patient by telephone for initial visit for Heart and Vascular Care Coordination.                                                                                                   Assessment:    CSW consulted to speak with pt regarding current insurance concerns.  Given notice that a stay related to a pre-existing condition would not be covered and also told by someone that a physician had not provided clinical documentation in a timely manner so his claim was being denied.  CSW assisted pt in calling [facility] [employer] to discuss his case.   They confirm that all of his claims are being reviewed by insurance right now and that they will plan to submit to both primary and secondary payors on record prior to sending him the finalized bill.  Nothing that pt or provider office need to do at this time as they confirmed all clinicals had been sent to insurance.                                  HRT/VAS Care Coordination     Living arrangements for the past 2 months Single Family Home   Lives with: Parents   Patient Current Curator Devices/Equipment None       Social History:                                                                             SDOH Screenings   Alcohol Screen: Low Risk    Last Alcohol Screening Score (AUDIT): 0  Depression (PHQ2-9): Low Risk    PHQ-2 Score: 0  Financial Resource Strain: Low Risk    Difficulty of Paying Living Expenses: Not hard at all  Food Insecurity: No Food Insecurity   Worried About Charity fundraiser in the Last Year: Never true   Arboriculturist in the Last Year: Never true  Housing: Low Risk    Last Housing Risk Score: 0  Physical Activity: Inactive   Days of Exercise per Week: 0 days   Minutes of Exercise per Session: 0 min  Social  Connections: Socially Isolated   Frequency of Communication with Friends and Family: Three times a week   Frequency of Social Gatherings with Friends and Family: Three times a week   Attends Religious Services: Never   Active Member of Clubs or Organizations: No   Attends Archivist Meetings: Never   Marital Status: Never married  Stress: No Stress Concern Present   Feeling of Stress : Not at  all  Tobacco Use: Medium Risk   Smoking Tobacco Use: Former   Smokeless Tobacco Use: Never   Passive Exposure: Not on file  Transportation Needs: No Transportation Needs   Lack of Transportation (Medical): No   Lack of Transportation (Non-Medical): No    Follow-up plan:    Pt awaiting final determinations from insurance- CSW encouraged pt to reach out if he is in need of further assistance.  Jorge Ny, LCSW Clinical Social Worker [facility] Desk#: [phone] Cell#: [phone]

## 2021-10-26 ENCOUNTER — Telehealth (HOSPITAL_COMMUNITY): Payer: Self-pay | Admitting: *Deleted

## 2021-10-26 NOTE — Telephone Encounter (Signed)
I spoke w/pt 1/12 pm, forms completed, signed by Dr Haroldine Laws, and emailed to [email or web address], mychart mess sent to pt that this was done

## 2021-10-26 NOTE — Telephone Encounter (Signed)
Pt left vm stating his paperwork to return to work has not been sent to his job. He requests a return call from "who ever is in charge of paperwork".   Message sent to [facility]

## 2021-10-29 ENCOUNTER — Ambulatory Visit (HOSPITAL_COMMUNITY): Payer: 59

## 2021-10-29 ENCOUNTER — Encounter (HOSPITAL_COMMUNITY): Payer: Self-pay

## 2021-10-29 ENCOUNTER — Encounter: Payer: Self-pay | Admitting: Physician Assistant

## 2021-10-30 ENCOUNTER — Encounter (HOSPITAL_COMMUNITY): Payer: Self-pay | Admitting: Internal Medicine

## 2021-11-05 ENCOUNTER — Encounter (HOSPITAL_COMMUNITY): Payer: Self-pay | Admitting: Internal Medicine

## 2021-11-09 ENCOUNTER — Telehealth (HOSPITAL_COMMUNITY): Payer: Self-pay | Admitting: Vascular Surgery

## 2021-11-09 NOTE — Telephone Encounter (Signed)
Left pt message to resch 3/6 appt echo/DB. Due tp provider being out of office

## 2021-11-16 ENCOUNTER — Ambulatory Visit (HOSPITAL_COMMUNITY): Payer: 59 | Attending: Internal Medicine | Admitting: Physical Therapy

## 2021-11-16 ENCOUNTER — Other Ambulatory Visit: Payer: Self-pay

## 2021-11-16 DIAGNOSIS — Z789 Other specified health status: Secondary | ICD-10-CM | POA: Insufficient documentation

## 2021-11-16 DIAGNOSIS — M545 Low back pain, unspecified: Secondary | ICD-10-CM | POA: Diagnosis present

## 2021-11-16 DIAGNOSIS — G8929 Other chronic pain: Secondary | ICD-10-CM | POA: Diagnosis present

## 2021-11-16 DIAGNOSIS — R293 Abnormal posture: Secondary | ICD-10-CM | POA: Diagnosis present
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
## 2021-11-16 NOTE — Patient Instructions (Signed)
Date: 11/16/2021 Prepared by: Adalberto Cole  Exercises [place] on Table - 3-4 x daily - 7 x weekly - 4 reps - 20s hold Prone SIJ Anterior Rotation Mobilization on Table - 3-4 x daily - 7 x weekly - 4 reps - 20s hold Self Traction Sitting - 3-4 x daily - 7 x weekly - 8 reps - 10s hold Supine Lower Trunk Rotation - 3-4 x daily - 7 x weekly - 2 sets - 12 reps

## 2021-11-20 ENCOUNTER — Ambulatory Visit (HOSPITAL_COMMUNITY): Payer: 59 | Admitting: Physical Therapy

## 2021-11-20 ENCOUNTER — Other Ambulatory Visit: Payer: Self-pay

## 2021-11-20 DIAGNOSIS — G8929 Other chronic pain: Secondary | ICD-10-CM

## 2021-11-20 DIAGNOSIS — M545 Low back pain, unspecified: Secondary | ICD-10-CM | POA: Diagnosis not present

## 2021-11-20 DIAGNOSIS — R293 Abnormal posture: Secondary | ICD-10-CM

## 2021-11-20 DIAGNOSIS — Z789 Other specified health status: Secondary | ICD-10-CM

## 2021-11-20 NOTE — Patient Instructions (Signed)
Piriformis Stretch, Sitting    Sit, one ankle on opposite knee, same-side hand on crossed knee. Push down on knee, keeping spine straight. Lean torso forward, with flat back, until tension is felt in hamstrings and gluteals of crossed-leg side. Hold _30__ seconds.  Repeat _2_ times per session. Do _2__ sessions per day.  Copyright  VHI. All rights reserved.  Bridge [employer] small of back into mat, maintain pelvic tilt, roll up one vertebrae at a time. Focus on engaging posterior hip muscles. Repeat _10___ times.   Straight Leg Raise    Tighten stomach and slowly raise locked leg  with opposite knee bent. Repeat _10__ times per set. Do _2___ sets per session. Do _2__ sessions per day.  HIP: Hamstrings - Long Sitting    Place one leg on surface with knee straight. Lean forward keeping back straight. Hold _30__ seconds. _2__ reps per set, _2__ sets per day

## 2021-11-20 NOTE — Therapy (Signed)
[place], [place], [address] Phone: [phone]   Fax:  [phone]  Physical Therapy Treatment  Patient Details  Name: Ruben Duffy MRN: [id number] Date of Birth: 22-Jun-1966 Referring Provider (PT):  Nation, MD   Encounter Date: 11/20/2021   PT End of Session - 11/20/21 1614     Visit Number 2    Number of Visits 5    Date for PT Re-Evaluation 12/21/21    Authorization Type [employer]    Authorization Time Period 10/14/2021 - Current    Progress Note Due on Visit 10    PT Start Time 2620    PT Stop Time 1655    PT Time Calculation (min) 45 min    Activity Tolerance Patient tolerated treatment well    Behavior During Therapy [facility] for tasks assessed/performed             Past Medical History:  Diagnosis Date   Arthritis    Emphysema of lung ([place])    GERD (gastroesophageal reflux disease)    Hypertension    Hypospadias, balanic    chronic   LBBB (left bundle branch block)    newly dx by ekg done 04-26-2021 for surgery on 06-05-2021,  pt stated he not had a previous ekg before, so pt sent for cardiac clearance prior to surgery;  cardiologist , dr berry, office note clearance in epic 05-09-2021 gave pt clearance with no further work-up needed   Lower urinary tract symptoms (LUTS)    Prostate cancer [facility])    urologist-- dr Jeffie Pollock--- dx by bx 01-08-2021, Gleason 3+4, T2a   Substance abuse ([place])     Past Surgical History:  Procedure Laterality Date   BIOPSY PROSTATE     CIRCUMCISION N/A 06/05/2021   Procedure: CIRCUMCISION ADULT;  Surgeon: Irine Seal, MD;  Location: [facility];  Service: Urology;  Laterality: N/A;   HEMORRHOID SURGERY     HYPOSPADIAS CORRECTION     RADIOACTIVE SEED IMPLANT N/A 06/05/2021   Procedure: RADIOACTIVE SEED IMPLANT/BRACHYTHERAPY IMPLANT;  Surgeon: Irine Seal, MD;  Location: [facility];  Service: Urology;  Laterality: N/A;    RIGHT/LEFT HEART CATH AND CORONARY ANGIOGRAPHY N/A 09/10/2021   Procedure: RIGHT/LEFT HEART CATH AND CORONARY ANGIOGRAPHY;  Surgeon: Jolaine Artist, MD;  Location: [place] [facility];  Service: Cardiovascular;  Laterality: N/A;   SPACE OAR INSTILLATION N/A 06/05/2021   Procedure: SPACE OAR INSTILLATION;  Surgeon: Irine Seal, MD;  Location: [facility];  Service: Urology;  Laterality: N/A;    There were no vitals filed for this visit.   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 No pain at rest but when he rotates or moves increases to 8-9.    Currently in Pain? No/denies                [facility] PT Assessment - 11/20/21 0001       Strength   Right Hip Flexion 4/5    Right Hip Extension 4+/5    Right Hip ABduction 5/5    Left Hip Flexion 4+/5    Left Hip Extension 4/5    Left Hip ABduction 5/5                           [facility] Adult PT Treatment/Exercise - 11/20/21 0001  Knee/Hip Exercises: Stretches   Active Hamstring Stretch Right;Left;2 reps;30 seconds;Limitations    Active Hamstring Stretch Limitations long sitting    Piriformis Stretch Left;Right;2 reps;30 seconds    Piriformis Stretch Limitations seated    Other Knee/Hip Stretches hip excursions 10X each      Knee/Hip Exercises: Supine   Bridges Both;20 reps    Single Leg Bridge Right;Left;10 reps    Straight Leg Raises Right;Left;10 reps      Manual Therapy   Manual Therapy Muscle Energy Technique    Manual therapy comments completed at beginning of session to correct Lt anterior rotation    Muscle Energy Technique to correct Lt anterior rotation                          PT Long Term Goals - 11/20/21 1715       PT LONG TERM GOAL #1   Title Patient will be independent with his HEP and not require any adjustments to form for the stretches being performed.    Time 5    Period Weeks     Status On-going    Target Date 12/21/21      PT LONG TERM GOAL #2   Title Patient will achieve an average resting lumbar pain level of no more than 2/10.    Baseline 8/10    Time 5    Period Weeks    Status On-going    Target Date 12/21/21      PT LONG TERM GOAL #3   Title Patient will be able to perform 10 consecutive STS transfers following a 5 minute sustained seated position without an onset of lumbar pain.    Time 5    Period Weeks    Status On-going    Target Date 12/21/21                   Plan - 11/20/21 1729     Clinical Impression Statement Goals and POC reviewed.  Pt reports pain with motion and states higher over Lt hip/glute region.  SI joint checked with noted anterior rotation of Lt.  Muscle energy technqiue completed with ambulation following.  Stength compared in LE's via MMT with weakness in Lt glute and Rt hip flexor.  Instructed with new exericses to improve LE weakness.  Piriformis and hamstirng stretches also added in long sitting.    Personal Factors and Comorbidities Age;Fitness;Profession;Time since onset of injury/illness/exacerbation    Examination-Activity Limitations Bathing;Bed Mobility;Bend;Lift;Dressing;Sit;Stairs;Squat;Stand;Toileting;Carry;Caring for Others;Locomotion Level;Transfers    Examination-Participation Restrictions Occupation;Yard Work;Driving;Community Activity;Cleaning;Laundry    Stability/Clinical Decision Making Stable/Uncomplicated    PT Frequency 1x / week    PT Duration Other (comment)   5 weeks   PT Treatment/Interventions ADLs/Self Care Home Management;Traction;DME Instruction;Functional mobility training;Therapeutic activities;Therapeutic exercise;Patient/family education;Passive range of motion    PT Next Visit Plan Check SI for alignment and f/u on current HEP .  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)    PT [place] on Table - 3-4 x daily -  7 x weekly - 4 reps - 20s hold  Prone SIJ Anterior Rotation Mobilization on Table - 3-4 x daily - 7 x weekly - 4 reps - 20s hold  Self Traction Sitting - 3-4 x daily - 7 x weekly - 8 reps - 10s hold  Supine Lower Trunk Rotation - 3-4 x daily - 7 x weekly - 2 sets -  12 reps  2/7: bridge, SLR, hamstring stretch and piriformis stretch    Consulted and Agree with Plan of Care Patient             Patient will benefit from skilled therapeutic intervention in order to improve the following deficits and impairments:  Decreased mobility, Hypomobility, Pain, Decreased knowledge of use of DME, Decreased range of motion, Improper body mechanics, Decreased activity tolerance, Postural dysfunction  Visit Diagnosis: Chronic bilateral low back pain without sciatica  Postural imbalance  Decreased activities of daily living (ADL)     Problem List Patient Active Problem List   Diagnosis Date Noted   Chronic systolic heart failure ([place]) 09/19/2021   New onset of congestive heart failure ([place]) 09/07/2021   Acute CHF (congestive heart failure) ([place]) 09/07/2021   Left bundle branch block 05/09/2021   Essential hypertension 05/09/2021   Preoperative clearance 05/09/2021   Malignant neoplasm of prostate ([place]) 02/23/2021   Teena Irani, PTA/CLT, WTA [phone]  Teena Irani, PTA 11/20/2021, 5:39 PM  [facility] [address] [place], [place], [address] Phone: [phone]   Fax:  [phone]  Name: Ja Ohman MRN: [id number] Date of Birth: [birth year]

## 2021-11-27 ENCOUNTER — Other Ambulatory Visit: Payer: Self-pay

## 2021-11-27 ENCOUNTER — Ambulatory Visit (HOSPITAL_COMMUNITY)
Admission: RE | Admit: 2021-11-27 | Discharge: 2021-11-27 | Disposition: A | Discharge status: Home / Self Care | Payer: 59 | Source: Ambulatory Visit | Attending: Physician Assistant | Admitting: Physician Assistant

## 2021-11-27 DIAGNOSIS — Z122 Encounter for screening for malignant neoplasm of respiratory organs: Secondary | ICD-10-CM | POA: Diagnosis present

## 2021-11-29 ENCOUNTER — Ambulatory Visit: Payer: No Typology Code available for payment source | Admitting: Urology

## 2021-11-29 ENCOUNTER — Telehealth: Payer: Self-pay

## 2021-11-29 NOTE — Telephone Encounter (Signed)
Pt notified with VU

## 2021-11-29 NOTE — Telephone Encounter (Signed)
-----   Message from Lincoln Brigham, PA-C sent at 11/28/2021  4:12 PM EST ----- Please notify patient that lung nodule is stable in size and we will recommend to monitor with annual screening low dose CT scan of the chest.

## 2021-11-29 NOTE — Telephone Encounter (Signed)
-----   Message from Lincoln Brigham, PA-C sent at 11/28/2021  4:16 PM EST ----- Please notify him that he needs to follow up with his urology and check his PSA levels

## 2021-11-29 NOTE — Telephone Encounter (Signed)
Pt advised. He has an appt with his new urologist Dr Irine Seal in [place] on 12/06/21

## 2021-11-30 ENCOUNTER — Encounter (HOSPITAL_COMMUNITY): Payer: Self-pay | Admitting: Physical Therapy

## 2021-11-30 ENCOUNTER — Ambulatory Visit (HOSPITAL_COMMUNITY): Payer: 59 | Admitting: Physical Therapy

## 2021-11-30 ENCOUNTER — Other Ambulatory Visit: Payer: Self-pay

## 2021-11-30 DIAGNOSIS — R293 Abnormal posture: Secondary | ICD-10-CM

## 2021-11-30 DIAGNOSIS — M545 Low back pain, unspecified: Secondary | ICD-10-CM

## 2021-11-30 DIAGNOSIS — Z789 Other specified health status: Secondary | ICD-10-CM

## 2021-11-30 NOTE — Therapy (Signed)
[place], [place], [address] Phone: [phone]   Fax:  [phone]  Physical Therapy Treatment  Patient Details  Name: Ruben Duffy MRN: [id number] Date of Birth: Apr 16, 1966 Referring Provider (PT): Pembroke Nation, MD   Encounter Date: 11/30/2021   PT End of Session - 11/30/21 0954     Visit Number 3    Number of Visits 5    Date for PT Re-Evaluation 12/21/21    Authorization Type [employer]    Authorization Time Period 10/14/2021 - Current    Progress Note Due on Visit 10    PT Start Time 0949    PT Stop Time 1029    PT Time Calculation (min) 40 min    Activity Tolerance Patient tolerated treatment well    Behavior During Therapy [facility] for tasks assessed/performed             Past Medical History:  Diagnosis Date   Arthritis    Emphysema of lung ([place])    GERD (gastroesophageal reflux disease)    Hypertension    Hypospadias, balanic    chronic   LBBB (left bundle branch block)    newly dx by ekg done 04-26-2021 for surgery on 06-05-2021,  pt stated he not had a previous ekg before, so pt sent for cardiac clearance prior to surgery;  cardiologist , dr berry, office note clearance in epic 05-09-2021 gave pt clearance with no further work-up needed   Lower urinary tract symptoms (LUTS)    Prostate cancer [facility])    urologist-- dr Jeffie Pollock--- dx by bx 01-08-2021, Gleason 3+4, T2a   Substance abuse ([place])     Past Surgical History:  Procedure Laterality Date   BIOPSY PROSTATE     CIRCUMCISION N/A 06/05/2021   Procedure: CIRCUMCISION ADULT;  Surgeon: Irine Seal, MD;  Location: [facility];  Service: Urology;  Laterality: N/A;   HEMORRHOID SURGERY     HYPOSPADIAS CORRECTION     RADIOACTIVE SEED IMPLANT N/A 06/05/2021   Procedure: RADIOACTIVE SEED IMPLANT/BRACHYTHERAPY IMPLANT;  Surgeon: Irine Seal, MD;  Location: [facility];  Service: Urology;  Laterality: N/A;    RIGHT/LEFT HEART CATH AND CORONARY ANGIOGRAPHY N/A 09/10/2021   Procedure: RIGHT/LEFT HEART CATH AND CORONARY ANGIOGRAPHY;  Surgeon: Jolaine Artist, MD;  Location: [place] [facility];  Service: Cardiovascular;  Laterality: N/A;   SPACE OAR INSTILLATION N/A 06/05/2021   Procedure: SPACE OAR INSTILLATION;  Surgeon: Irine Seal, MD;  Location: [facility];  Service: Urology;  Laterality: N/A;    There were no vitals filed for this visit.   Subjective Assessment - 11/30/21 0953     Subjective Patient reports ongoing pain with lumbar rotation. He says he had some pain just sitting at breakfast today.    Currently in Pain? Yes    Pain Score 7     Pain Location Back    Pain Orientation Posterior;Lower;Left    Pain Descriptors / Indicators Aching    Pain Type Chronic pain                               [facility] Adult PT Treatment/Exercise - 11/30/21 0001       Knee/Hip Exercises: Supine   Other Supine Knee/Hip Exercises isometric hip abduction/ adduction 10 x 5", bridge 10 x 5"    Other Supine Knee/Hip Exercises self MET using dowel rod for anterior  hip rotation on LT 10 x 5"      Knee/Hip Exercises: Prone   Other Prone Exercises hip abduction iso 10 x 5"      Manual Therapy   Manual Therapy Joint mobilization    Manual therapy comments completed separate from all other activity    Joint Mobilization P/A to LT PSIS for SI correction                          PT Long Term Goals - 11/20/21 1715       PT LONG TERM GOAL #1   Title Patient will be independent with his HEP and not require any adjustments to form for the stretches being performed.    Time 5    Period Weeks    Status On-going    Target Date 12/21/21      PT LONG TERM GOAL #2   Title Patient will achieve an average resting lumbar pain level of no more than 2/10.    Baseline 8/10    Time 5    Period Weeks    Status On-going    Target Date 12/21/21      PT LONG  TERM GOAL #3   Title Patient will be able to perform 10 consecutive STS transfers following a 5 minute sustained seated position without an onset of lumbar pain.    Time 5    Period Weeks    Status On-going    Target Date 12/21/21                   Plan - 11/30/21 1029     Clinical Impression Statement Patient tolerated session well today. Able to abolish pain with SI correction isometric for anterior LT innominate. Educated patient on purpose and function. Progressed SI stabilization isometrics. Issued HEP handout. Patient will continue to benefit from skilled therapy services to reduce deficits and improve function.    Personal Factors and Comorbidities Age;Fitness;Profession;Time since onset of injury/illness/exacerbation    Examination-Activity Limitations Bathing;Bed Mobility;Bend;Lift;Dressing;Sit;Stairs;Squat;Stand;Toileting;Carry;Caring for Others;Locomotion Level;Transfers    Examination-Participation Restrictions Occupation;Yard Work;Driving;Community Activity;Cleaning;Laundry    Stability/Clinical Decision Making Stable/Uncomplicated    PT Frequency 1x / week    PT Duration Other (comment)   5 weeks   PT Treatment/Interventions ADLs/Self Care Home Management;Traction;DME Instruction;Functional mobility training;Therapeutic activities;Therapeutic exercise;Patient/family education;Passive range of motion    PT Next Visit Plan Check SI for alignment and f/u on current HEP .  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)    PT Home Exercise Plan [place] Stretch on Table - 3-4 x daily - 7 x weekly - 4 reps - 20s hold  Prone SIJ Anterior Rotation Mobilization on Table - 3-4 x daily - 7 x weekly - 4 reps - 20s hold  Self Traction Sitting - 3-4 x daily - 7 x weekly - 8 reps - 10s hold  Supine Lower Trunk Rotation - 3-4 x daily - 7 x weekly - 2 sets - 12 reps  2/7: bridge, SLR, hamstring stretch and piriformis stretch 2/17 SI MET for ant  LT inominate rotation    Consulted and Agree with Plan of Care Patient             Patient will benefit from skilled therapeutic intervention in order to improve the following deficits and impairments:  Decreased mobility, Hypomobility, Pain, Decreased knowledge of use of DME, Decreased range of motion, Improper body mechanics, Decreased  activity tolerance, Postural dysfunction  Visit Diagnosis: Chronic bilateral low back pain without sciatica  Postural imbalance  Decreased activities of daily living (ADL)     Problem List Patient Active Problem List   Diagnosis Date Noted   Chronic systolic heart failure ([place]) 09/19/2021   New onset of congestive heart failure ([place]) 09/07/2021   Acute CHF (congestive heart failure) ([place]) 09/07/2021   Left bundle branch block 05/09/2021   Essential hypertension 05/09/2021   Preoperative clearance 05/09/2021   Malignant neoplasm of prostate ([place]) 02/23/2021   10:32 AM, 11/30/21 Josue Hector PT DPT  Physical Therapist with [facility]  [phone] [place] [place], [place], [address] Phone: [phone]   Fax:  [phone]  Name: Ruben Duffy MRN: [id number] Date of Birth: 02-Jul-1966

## 2021-11-30 NOTE — Patient Instructions (Signed)
Access Code: [email or web address] URL: [email or web address] Date: 11/30/2021 Prepared by: Josue Hector  Exercises 90/90 SI Joint Self-Correction with Dowel - 3 x daily - 7 x weekly - 1 sets - 10 reps

## 2021-12-06 ENCOUNTER — Telehealth (HOSPITAL_COMMUNITY): Payer: Self-pay

## 2021-12-06 ENCOUNTER — Ambulatory Visit: Payer: No Typology Code available for payment source | Admitting: Urology

## 2021-12-06 ENCOUNTER — Encounter (HOSPITAL_COMMUNITY): Payer: No Typology Code available for payment source

## 2021-12-06 NOTE — Telephone Encounter (Signed)
No show, called and spoke with mother who stated Jammal has not made it home from work yet.  Reminded next apt date and time and included contact number for pt. to call if unable to make it to next apt.    Ihor Austin, LPTA/CLT; Delana Meyer [phone]

## 2021-12-13 ENCOUNTER — Encounter (HOSPITAL_COMMUNITY): Payer: No Typology Code available for payment source | Admitting: Physical Therapy

## 2021-12-17 ENCOUNTER — Encounter (HOSPITAL_COMMUNITY): Payer: 59 | Admitting: Internal Medicine

## 2021-12-17 ENCOUNTER — Other Ambulatory Visit (HOSPITAL_COMMUNITY): Payer: 59

## 2021-12-17 ENCOUNTER — Encounter (HOSPITAL_COMMUNITY): Payer: No Typology Code available for payment source | Admitting: Physical Therapy

## 2022-01-10 ENCOUNTER — Ambulatory Visit (INDEPENDENT_AMBULATORY_CARE_PROVIDER_SITE_OTHER): Payer: 59 | Admitting: Urology

## 2022-01-10 ENCOUNTER — Encounter: Payer: Self-pay | Admitting: Urology

## 2022-01-10 VITALS — BP 117/69 | HR 75 | Ht 68.0 in | Wt 203.0 lb

## 2022-01-10 DIAGNOSIS — C61 Malignant neoplasm of prostate: Secondary | ICD-10-CM

## 2022-01-10 DIAGNOSIS — R3912 Poor urinary stream: Secondary | ICD-10-CM

## 2022-01-10 DIAGNOSIS — R350 Frequency of micturition: Secondary | ICD-10-CM

## 2022-01-10 DIAGNOSIS — N3281 Overactive bladder: Secondary | ICD-10-CM

## 2022-01-10 DIAGNOSIS — N3941 Urge incontinence: Secondary | ICD-10-CM

## 2022-01-10 LAB — URINALYSIS, ROUTINE W REFLEX MICROSCOPIC
Bilirubin, UA: NEGATIVE
Ketones, UA: NEGATIVE
Leukocytes,UA: NEGATIVE
Nitrite, UA: NEGATIVE
Protein,UA: NEGATIVE
RBC, UA: NEGATIVE
Specific Gravity, UA: <1.005 — ABNORMAL LOW (ref 1.005–1.030)
Urobilinogen, Ur: 0.2 mg/dL (ref 0.2–1.0)
pH, UA: 5.5 (ref 5.0–7.5)

## 2022-01-10 MED ORDER — SILODOSIN 8 MG PO CAPS
8.0000 mg | ORAL_CAPSULE | Freq: Every day | ORAL | 11 refills | Status: DC
Start: 2022-01-10 — End: 2022-10-03

## 2022-01-10 NOTE — Addendum Note (Signed)
Addended by: Freada Bergeron on: 01/10/2022 12:24 PM ? ? Modules accepted: Orders ? ?

## 2022-01-10 NOTE — Progress Notes (Signed)
?Subjective: ?1. Prostate cancer ([place])   ?2. Overactive bladder   ?3. Urge incontinence   ?4. Weak urinary stream   ?5. Urinary frequency   ?  ? ?01/10/22: Ruben Duffy returns today in f/u.  He has previously been seen in [place] for his history of T2a Nx Mx GG2 prostate cancer treated with seeds on 05/15/21.  He had a circumcision at that time as well.  He continues to have moderate to severe LUTS with an IPSS of 20.  He had some burning over the last few days.  He has had no hematuria.  He remains on tamsulosin but hasn't had much improvement.  He had CHF in November and is seeing Dr. Haroldine Laws for that.   He has some recurrent SOB over the last few days.  He has no edema.   His PSA was down to 0.69 in January from 1.57 preop.  He is doing ok with the circumcision but he still has some redundant skin.   UA today has 3+ glucose but is otherwise clear.   His Hgb A1c was only 6.0 on 09/08/21.   He has some polydipsia on the Farxiga.  ? ? IPSS   ? ? [place] Name 01/10/22 1100  ?  ?  ?  ? International Prostate Symptom Score  ? How often have you had the sensation of not emptying your bladder? Less than half the time    ? How often have you had to urinate less than every two hours? More than half the time    ? How often have you found you stopped and started again several times when you urinated? Less than half the time    ? How often have you found it difficult to postpone urination? More than half the time    ? How often have you had a weak urinary stream? More than half the time    ? How often have you had to strain to start urination? Less than half the time    ? How many times did you typically get up at night to urinate? 2 Times    ? Total IPSS Score 20    ?  ? Quality of Life due to urinary symptoms  ? If you were to spend the rest of your life with your urinary condition just the way it is now how would you feel about that? Unhappy    ? ?  ?  ? ?  ? ? ?ROS: ? ?Review of Systems  ?Constitutional:  Positive for  malaise/fatigue.  ?HENT:  Positive for sinus pain.   ?Respiratory:  Positive for shortness of breath.   ?Musculoskeletal:  Positive for back pain and joint pain.  ?Skin:  Positive for itching.  ?Neurological:  Positive for weakness.  ?Endo/Heme/Allergies:  Positive for polydipsia.  ?All other systems reviewed and are negative. ? ?No Known Allergies ? ?Past Medical History:  ?Diagnosis Date  ? Arthritis   ? CHF (congestive heart failure) ([place])   ? Emphysema of lung ([place])   ? GERD (gastroesophageal reflux disease)   ? Hypertension   ? Hypospadias, balanic   ? chronic  ? LBBB (left bundle branch block)   ? newly dx by ekg done 04-26-2021 for surgery on 06-05-2021,  pt stated he not had a previous ekg before, so pt sent for cardiac clearance prior to surgery;  cardiologist , dr berry, office note clearance in epic 05-09-2021 gave pt clearance with no further work-up needed  ? Lower urinary tract  symptoms (LUTS)   ? Prostate cancer ([place])   ? urologist-- dr Jeffie Pollock--- dx by bx 01-08-2021, Gleason 3+4, T2a  ? Substance abuse ([place])   ? ? ?Past Surgical History:  ?Procedure Laterality Date  ? BIOPSY PROSTATE    ? CIRCUMCISION N/A 06/05/2021  ? Procedure: CIRCUMCISION ADULT;  Surgeon: Irine Seal, MD;  Location[facility];  Service: Urology;  Laterality: N/A;  ? HEMORRHOID SURGERY    ? HYPOSPADIAS CORRECTION    ? RADIOACTIVE SEED IMPLANT N/A 06/05/2021  ? Procedure: RADIOACTIVE SEED IMPLANT/BRACHYTHERAPY IMPLANT;  Surgeon: Irine Seal, MD;  Location: [facility];  Service: Urology;  Laterality: N/A;  ? RIGHT/LEFT HEART CATH AND CORONARY ANGIOGRAPHY N/A 09/10/2021  ? Procedure: RIGHT/LEFT HEART CATH AND CORONARY ANGIOGRAPHY;  Surgeon: Jolaine Artist, MD;  Location: [place] [facility];  Service: Cardiovascular;  Laterality: N/A;  ? SPACE OAR INSTILLATION N/A 06/05/2021  ? Procedure: SPACE OAR INSTILLATION;  Surgeon: Irine Seal, MD;  Location: [facility];  Service: Urology;   Laterality: N/A;  ? ? ?Social History  ? ?Socioeconomic History  ? Marital status: Single  ?  Spouse name: Not on file  ? Number of children: 1  ? Years of education: Not on file  ? Highest education level: Not on file  ?Occupational History  ? Occupation: Pensions consultant and [employer]  ?  Comment: Freight forwarder  ?Tobacco Use  ? Smoking status: Former  ?  Packs/day: 1.50  ?  Years: 30.00  ?  Pack years: 45.00  ?  Types: Cigarettes  ?  Quit date: 2009  ?  Years since quitting: 14.2  ? Smokeless tobacco: Never  ?Vaping Use  ? Vaping Use: Never used  ?Substance and Sexual Activity  ? Alcohol use: Not Currently  ? Drug use: Not Currently  ?  Types: Marijuana, "Crack" cocaine  ? Sexual activity: Yes  ?Other Topics Concern  ? Not on file  ?Social History Narrative  ? Not on file  ? ?Social Determinants of Health  ? ?Financial Resource Strain: Low Risk   ? Difficulty of Paying Living Expenses: Not hard at all  ?Food Insecurity: No Food Insecurity  ? Worried About Charity fundraiser in the Last Year: Never true  ? Ran Out of Food in the Last Year: Never true  ?Transportation Needs: No Transportation Needs  ? Lack of Transportation (Medical): No  ? Lack of Transportation (Non-Medical): No  ?Physical Activity: Inactive  ? Days of Exercise per Week: 0 days  ? Minutes of Exercise per Session: 0 min  ?Stress: No Stress Concern Present  ? Feeling of Stress : Not at all  ?Social Connections: Socially Isolated  ? Frequency of Communication with Friends and Family: Three times a week  ? Frequency of Social Gatherings with Friends and Family: Three times a week  ? Attends Religious Services: Never  ? Active Member of Clubs or Organizations: No  ? Attends Archivist Meetings: Never  ? Marital Status: Never married  ?Intimate Partner Violence: Not At Risk  ? Fear of Current or Ex-Partner: No  ? Emotionally Abused: No  ? Physically Abused: No  ? Sexually Abused: No  ? ? ?Family History  ?Problem Relation Age of Onset  ? Stroke  Father   ? Lung cancer Maternal Aunt   ? Diabetes Maternal Uncle   ? Brain cancer Maternal Uncle   ? Lung cancer Maternal Uncle   ? Lung cancer Maternal Uncle   ?  Cancer Paternal Grandmother   ? Diabetes Paternal Grandfather   ? Colon cancer Neg Hx   ? Esophageal cancer Neg Hx   ? Stomach cancer Neg Hx   ? Pancreatic cancer Neg Hx   ? Colon polyps Neg Hx   ? Rectal cancer Neg Hx   ? ? ?Anti-infectives: ?Anti-infectives (From admission, onward)  ? ? None  ? ?  ? ? ?Current Outpatient Medications  ?Medication Sig Dispense Refill  ? amiodarone (PACERONE) 200 MG tablet Take 1 tablet (200 mg total) by mouth 2 (two) times daily. 60 tablet 6  ? atorvastatin (LIPITOR) 40 MG tablet Take 1 tablet (40 mg total) by mouth daily. 40 tablet 6  ? baclofen (LIORESAL) 10 MG tablet Take 10 mg by mouth 3 (three) times daily as needed.    ? carvedilol (COREG) 3.125 MG tablet Take 1 tablet (3.125 mg total) by mouth 2 (two) times daily with a meal. 60 tablet 4  ? cyclobenzaprine (FLEXERIL) 5 MG tablet Take 5 mg by mouth 3 (three) times daily as needed.    ? dapagliflozin propanediol (FARXIGA) 10 MG TABS tablet Take 1 tablet (10 mg total) by mouth daily. 90 tablet 1  ? digoxin (LANOXIN) 0.125 MG tablet Take 0.5 tablets (0.0625 mg total) by mouth daily. 30 tablet 6  ? DULoxetine (CYMBALTA) 60 MG capsule Take 60 mg by mouth daily.    ? magnesium oxide (MAG-OX) 400 (240 Mg) MG tablet Take 0.5 tablets by mouth daily.    ? magnesium oxide (MAG-OX) 400 MG tablet Take 1/2 tablet (200 mg total) by mouth daily. 30 tablet 6  ? Multiple Vitamin (MULTIVITAMIN ADULT PO) Take by mouth.    ? omeprazole (PRILOSEC) 40 MG capsule Take 1 capsule by mouth daily.    ? sacubitril-valsartan (ENTRESTO) 24-26 MG Take 1 tablet by mouth 2 (two) times daily. 180 tablet 1  ? silodosin (RAPAFLO) 8 MG CAPS capsule Take 1 capsule (8 mg total) by mouth daily with breakfast. 30 capsule 11  ? spironolactone (ALDACTONE) 25 MG tablet Take 1 tablet (25 mg total) by mouth  daily. 30 tablet 6  ? triamcinolone (KENALOG) 0.025 % ointment Apply topically daily as needed (itching, rash).    ? valACYclovir (VALTREX) 500 MG tablet Take 500 mg by mouth 2 (two) times daily as needed.    ? ?No current f

## 2022-01-11 ENCOUNTER — Encounter (HOSPITAL_COMMUNITY): Payer: Self-pay | Admitting: Internal Medicine

## 2022-01-11 ENCOUNTER — Ambulatory Visit (HOSPITAL_BASED_OUTPATIENT_CLINIC_OR_DEPARTMENT_OTHER)
Admission: RE | Admit: 2022-01-11 | Discharge: 2022-01-11 | Disposition: A | Discharge status: Home / Self Care | Payer: 59 | Source: Ambulatory Visit | Attending: Internal Medicine | Admitting: Internal Medicine

## 2022-01-11 ENCOUNTER — Ambulatory Visit (HOSPITAL_COMMUNITY)
Admission: RE | Admit: 2022-01-11 | Discharge: 2022-01-11 | Disposition: A | Discharge status: Home / Self Care | Payer: 59 | Source: Ambulatory Visit | Attending: Family Medicine | Admitting: Family Medicine

## 2022-01-11 ENCOUNTER — Other Ambulatory Visit: Payer: Self-pay

## 2022-01-11 VITALS — BP 128/80 | HR 60 | Wt 207.0 lb

## 2022-01-11 DIAGNOSIS — J439 Emphysema, unspecified: Secondary | ICD-10-CM | POA: Insufficient documentation

## 2022-01-11 DIAGNOSIS — Z8546 Personal history of malignant neoplasm of prostate: Secondary | ICD-10-CM | POA: Insufficient documentation

## 2022-01-11 DIAGNOSIS — I5022 Chronic systolic (congestive) heart failure: Secondary | ICD-10-CM

## 2022-01-11 DIAGNOSIS — Z79899 Other long term (current) drug therapy: Secondary | ICD-10-CM | POA: Insufficient documentation

## 2022-01-11 DIAGNOSIS — I340 Nonrheumatic mitral (valve) insufficiency: Secondary | ICD-10-CM | POA: Insufficient documentation

## 2022-01-11 DIAGNOSIS — R7303 Prediabetes: Secondary | ICD-10-CM | POA: Diagnosis not present

## 2022-01-11 DIAGNOSIS — I428 Other cardiomyopathies: Secondary | ICD-10-CM | POA: Diagnosis not present

## 2022-01-11 DIAGNOSIS — I110 Hypertensive heart disease with heart failure: Secondary | ICD-10-CM | POA: Insufficient documentation

## 2022-01-11 DIAGNOSIS — Z7984 Long term (current) use of oral hypoglycemic drugs: Secondary | ICD-10-CM | POA: Diagnosis not present

## 2022-01-11 DIAGNOSIS — R0683 Snoring: Secondary | ICD-10-CM | POA: Insufficient documentation

## 2022-01-11 DIAGNOSIS — I447 Left bundle-branch block, unspecified: Secondary | ICD-10-CM

## 2022-01-11 DIAGNOSIS — Z87891 Personal history of nicotine dependence: Secondary | ICD-10-CM | POA: Insufficient documentation

## 2022-01-11 DIAGNOSIS — K219 Gastro-esophageal reflux disease without esophagitis: Secondary | ICD-10-CM | POA: Diagnosis not present

## 2022-01-11 DIAGNOSIS — I2510 Atherosclerotic heart disease of native coronary artery without angina pectoris: Secondary | ICD-10-CM | POA: Insufficient documentation

## 2022-01-11 DIAGNOSIS — Z09 Encounter for follow-up examination after completed treatment for conditions other than malignant neoplasm: Secondary | ICD-10-CM | POA: Insufficient documentation

## 2022-01-11 DIAGNOSIS — I10 Essential (primary) hypertension: Secondary | ICD-10-CM

## 2022-01-11 LAB — BASIC METABOLIC PANEL
Anion gap: 5 (ref 5–15)
BUN: 15 mg/dL (ref 6–20)
CO2: 27 mmol/L (ref 22–32)
Calcium: 9.4 mg/dL (ref 8.9–10.3)
Chloride: 107 mmol/L (ref 98–111)
Creatinine, Ser: 1.02 mg/dL (ref 0.61–1.24)
GFR, Estimated: >60 mL/min (ref >60)
Glucose, Bld: 104 mg/dL — ABNORMAL HIGH (ref 70–99)
Potassium: 4.9 mmol/L (ref 3.5–5.1)
Sodium: 139 mmol/L (ref 135–145)

## 2022-01-11 LAB — ECHOCARDIOGRAM COMPLETE
Area-P 1/2: 3.77 cm2
MV M vel: 4.43 m/s
MV Peak grad: 78.5 mmHg
S' Lateral: 5.6 cm
Single Plane A2C EF: 35.4 %

## 2022-01-11 LAB — PSA: Prostate Specific Ag, Serum: 0.7 ng/mL (ref 0.0–4.0)

## 2022-01-11 LAB — BRAIN NATRIURETIC PEPTIDE: B Natriuretic Peptide: 151.7 pg/mL — ABNORMAL HIGH (ref 0.0–100.0)

## 2022-01-11 MED ORDER — POTASSIUM CHLORIDE CRYS ER 20 MEQ PO TBCR: 20.0000 meq | EXTENDED_RELEASE_TABLET | Freq: Every day | ORAL | 6 refills | Status: DC

## 2022-01-11 MED ORDER — ENTRESTO 49-51 MG PO TABS: 1.0000 | ORAL_TABLET | Freq: Two times a day (BID) | ORAL | 6 refills | Status: DC

## 2022-01-11 MED ORDER — FUROSEMIDE 40 MG PO TABS: 40.0000 mg | ORAL_TABLET | Freq: Every day | ORAL | 6 refills | Status: DC

## 2022-01-11 NOTE — Telephone Encounter (Signed)
3/30 APPROVED [place] ?AUTH #[id number] ?CLINICALS FAXED TO [place] ?

## 2022-01-11 NOTE — Patient Instructions (Signed)
Medication Changes: ? ?Increase Entresto to 49/51 mg Twice daily ? ?START Furosemide 40 mg (1 tab) Daily ? ?START Potassium 20 meq (1 tab) Daily ? ?Lab Work: ? ?Labs done today, your results will be available in MyChart, we will contact you for abnormal readings. ? ?Testing/Procedures: ? ?None ? ?Referrals: ? ?You have been referred to [facility], EP division, they will call you for an appointment ? ? ?Special Instructions // Education: ? ?Do the following things EVERYDAY: ?Weigh yourself in the morning before breakfast. Write it down and keep it in a log. ?Take your medicines as prescribed ?Eat low salt foods--Limit salt (sodium) to 2000 mg per day.  ?Stay as active as you can everyday ?Limit all fluids for the day to less than 2 liters ? ? ?Follow-Up in: 3 months ? ?At the [facility], you and your health needs are our priority. We have a designated team specialized in the treatment of Heart Failure. This Care Team includes your primary Heart Failure Specialized Cardiologist (physician), Advanced Practice Providers (APPs- Physician Assistants and Nurse Practitioners), and Pharmacist who all work together to provide you with the care you need, when you need it.  ? ?You may see any of the following providers on your designated Care Team at your next follow up: ? ?Dr Glori Bickers ?Dr Loralie Champagne ?Darrick Grinder, NP ?Lyda Jester, PA ?Jessica Milford,NP ?Marlyce Huge, PA ?Audry Riles, PharmD ? ? ?Please be sure to bring in all your medications bottles to every appointment.  ? ?Need to Contact us: ? ?If you have any questions or concerns before your next appointment please send [place] a message through [place] or call our office at [phone].   ? ?TO LEAVE A MESSAGE FOR THE NURSE SELECT OPTION 2, PLEASE LEAVE A MESSAGE INCLUDING: ?YOUR NAME ?DATE OF BIRTH ?CALL BACK NUMBER ?REASON FOR CALL**this is important as we prioritize the call backs ? ?YOU WILL RECEIVE A CALL BACK THE SAME DAY AS LONG  AS YOU CALL BEFORE 4:00 PM ? ? ?

## 2022-01-11 NOTE — Progress Notes (Signed)
? ?ADVANCED HF CLINIC NOTE ? ? ?Primary Care: Bonnita Hollow, MD ?Primary Cardiologist: Dr. Haroldine Laws ? 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 ? ?He was seen by Dr Gwenlyn Found 04/2021 for preop eval before radioactive seed implant and circumcision. He had LBBB of unknown duration, BP well-controlled. No further workup pre-procedure. ? ?Admitted 48/25 for a/c systolic HF. Echo EF 20-25%. LV dilated (6.5cm) with severe central MR. Global HK with ? AK of anteroseptum.  Diuresed with IV lasix. Initiated on GDMT with digoxin, spironolactone, Entresto and Farxiga. Frequent PVCs, started on amio w/ reduction in PVC burden. R/LHC showed minimal nonobstructive CAD, cMRI showed LVEF 14%, diffuse HK, mild/mod RV dilation w/ severe systolic dysfunction, RVEF 15%, small nonspecific basal inferoseptal RV insertion site LGE, mild elevation in ECV, not suggestive of cardiac amyloidosis. 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 LifeVest placed ? ?Home sleep study.12/22 AHI 0.6/hr ? ?Today he returns f/u. Feels ok. Still driving forklift. Gets SOB with very mild exertion. Denies CP, edema, orthopnea or PND. Compliant with meds ? ?Echo today 01/11/22: EF 25-30% LV markedly dilated. Severe dyssynchrony. RV normal. Mild to moderate MR Personally reviewed ? ? ?Cardiac Studies: ?- Cardiac MRI (11/22):  LVEF 14%, diffuse HK, mild/mod RV dilation w/ severe systolic dysfunction, RVEF 15%, small nonspecific basal inferoseptal RV insertion site LGE, mild elevation in ECV, not suggestive of cardiac amyloidosis. ?  ?- Echo 11/22 EF 20-25%. LV dilated (6.5cm) with severe central MR. Global HK with ? AK of anteroseptum  ? ?- RHC/LHC with minimal obstructive CAD, low filling pressure, normal output and severe NICM ?  ? ?Past Medical History:  ?Diagnosis Date  ? Arthritis   ? CHF (congestive heart failure) ([place])   ? Emphysema of lung  ([place])   ? GERD (gastroesophageal reflux disease)   ? Hypertension   ? Hypospadias, balanic   ? chronic  ? LBBB (left bundle branch block)   ? newly dx by ekg done 04-26-2021 for surgery on 06-05-2021,  pt stated he not had a previous ekg before, so pt sent for cardiac clearance prior to surgery;  cardiologist , dr berry, office note clearance in epic 05-09-2021 gave pt clearance with no further work-up needed  ? Lower urinary tract symptoms (LUTS)   ? Prostate cancer ([place])   ? urologist-- dr Jeffie Pollock--- dx by bx 01-08-2021, Gleason 3+4, T2a  ? Substance abuse ([place])   ? ? ?Current Outpatient Medications  ?Medication Sig Dispense Refill  ? amiodarone (PACERONE) 200 MG tablet Take 1 tablet (200 mg total) by mouth 2 (two) times daily. 60 tablet 6  ? atorvastatin (LIPITOR) 40 MG tablet Take 1 tablet (40 mg total) by mouth daily. 40 tablet 6  ? baclofen (LIORESAL) 10 MG tablet Take 10 mg by mouth 3 (three) times daily as needed.    ? carvedilol (COREG) 3.125 MG tablet Take 1 tablet (3.125 mg total) by mouth 2 (two) times daily with a meal. 60 tablet 4  ? cyclobenzaprine (FLEXERIL) 5 MG tablet Take 5 mg by mouth 3 (three) times daily as needed.    ? dapagliflozin propanediol (FARXIGA) 10 MG TABS tablet Take 1 tablet (10 mg total) by mouth daily. 90 tablet 1  ? digoxin (LANOXIN) 0.125 MG tablet Take 0.5 tablets (0.0625 mg total) by mouth daily. 30 tablet 6  ? DULoxetine (CYMBALTA) 60 MG capsule Take 60 mg  by mouth daily.    ? magnesium oxide (MAG-OX) 400 MG tablet Take 1/2 tablet (200 mg total) by mouth daily. 30 tablet 6  ? Multiple Vitamin (MULTIVITAMIN ADULT PO) Take by mouth.    ? omeprazole (PRILOSEC) 40 MG capsule Take 1 capsule by mouth daily.    ? sacubitril-valsartan (ENTRESTO) 24-26 MG Take 1 tablet by mouth 2 (two) times daily. 180 tablet 1  ? spironolactone (ALDACTONE) 25 MG tablet Take 1 tablet (25 mg total) by mouth daily. 30 tablet 6  ? triamcinolone (KENALOG) 0.025 % ointment Apply topically daily as needed  (itching, rash).    ? valACYclovir (VALTREX) 500 MG tablet Take 500 mg by mouth 2 (two) times daily as needed.    ? silodosin (RAPAFLO) 8 MG CAPS capsule Take 1 capsule (8 mg total) by mouth daily with breakfast. (Patient not taking: Reported on 01/11/2022) 30 capsule 11  ? ?No current facility-administered medications for this encounter.  ? ?No Known Allergies ? ?Social History  ? ?Socioeconomic History  ? Marital status: Single  ?  Spouse name: Not on file  ? Number of children: 1  ? Years of education: Not on file  ? Highest education level: Not on file  ?Occupational History  ? Occupation: Pensions consultant and gam[employer]  ?  Comment: Freight forwarder  ?Tobacco Use  ? Smoking status: Former  ?  Packs/day: 1.50  ?  Years: 30.00  ?  Pack years: 45.00  ?  Types: Cigarettes  ?  Quit date: 2009  ?  Years since quitting: 14.2  ? Smokeless tobacco: Never  ?Vaping Use  ? Vaping Use: Never used  ?Substance and Sexual Activity  ? Alcohol use: Not Currently  ? Drug use: Not Currently  ?  Types: Marijuana, "Crack" cocaine  ? Sexual activity: Yes  ?Other Topics Concern  ? Not on file  ?Social History Narrative  ? Not on file  ? ?Social Determinants of Health  ? ?Financial Resource Strain: Low Risk   ? Difficulty of Paying Living Expenses: Not hard at all  ?Food Insecurity: No Food Insecurity  ? Worried About Charity fundraiser in the Last Year: Never true  ? Ran Out of Food in the Last Year: Never true  ?Transportation Needs: No Transportation Needs  ? Lack of Transportation (Medical): No  ? Lack of Transportation (Non-Medical): No  ?Physical Activity: Inactive  ? Days of Exercise per Week: 0 days  ? Minutes of Exercise per Session: 0 min  ?Stress: No Stress Concern Present  ? Feeling of Stress : Not at all  ?Social Connections: Socially Isolated  ? Frequency of Communication with Friends and Family: Three times a week  ? Frequency of Social Gatherings with Friends and Family: Three times a week  ? Attends Religious Services: Never  ?  Active Member of Clubs or Organizations: No  ? Attends Archivist Meetings: Never  ? Marital Status: Never married  ?Intimate Partner Violence: Not At Risk  ? Fear of Current or Ex-Partner: No  ? Emotionally Abused: No  ? Physically Abused: No  ? Sexually Abused: No  ? ? ?Family History  ?Problem Relation Age of Onset  ? Stroke Father   ? Lung cancer Maternal Aunt   ? Diabetes Maternal Uncle   ? Brain cancer Maternal Uncle   ? Lung cancer Maternal Uncle   ? Lung cancer Maternal Uncle   ? Cancer Paternal Grandmother   ? Diabetes Paternal Grandfather   ? Colon cancer  Neg Hx   ? Esophageal cancer Neg Hx   ? Stomach cancer Neg Hx   ? Pancreatic cancer Neg Hx   ? Colon polyps Neg Hx   ? Rectal cancer Neg Hx   ? ?BP 128/80   Pulse 60   Wt 93.9 kg (207 lb)   SpO2 98%   BMI 31.47 kg/m?  ? ?Wt Readings from Last 3 Encounters:  ?01/11/22 93.9 kg (207 lb)  ?01/10/22 92.1 kg (203 lb)  ?10/18/21 88.9 kg (196 lb)  ? ?PHYSICAL EXAM: ?General:  Well appearing. No resp difficulty ?HEENT: normal ?Neck: supple.JVP 7. Carotids 2+ bilat; no bruits. No lymphadenopathy or thryomegaly appreciated. ?Cor: PMI nondisplaced. Regular rate & rhythm. No rubs, gallops or murmurs. ?Lungs: mild wheeze in RLL ?Abdomen: soft, nontender, nondistended. No hepatosplenomegaly. No bruits or masses. Good bowel sounds. ?Extremities: no cyanosis, clubbing, rash, tr edema ?Neuro: alert & orientedx3, cranial nerves grossly intact. moves all 4 extremities w/o difficulty. Affect pleasant ? ? ?ASSESSMENT & PLAN:  ? ?1. Chronic systolic HF ?- Echo 17/79 EF 20-25%. LV dilated (6.5cm) with severe central MR. Global HK with ? AK of anteroseptum  ?- RHC/LHC 11/22 with minimal obstructive CAD, low filling pressure, normal output and severe NICM ?- cMRI LVEF 14%, diffuse HK, mild/mod RV dilation w/ severe systolic dysfunction, RVEF 15%, small nonspecific basal inferoseptal RV insertion site LGE, mild elevation in ECV, not suggestive of cardiac  amyloidosis. ?- Echo today 01/11/22: EF 25-30% LV markedly dilated. Severe dyssynchrony. RV normal. Mild to moderate MR Personally reviewed ?- Suspect LBBB CMP (+/- PVC CM)  ?- NYHA III today, volume status looks elevated. ReDS

## 2022-01-11 NOTE — Progress Notes (Signed)
?  Echocardiogram ?2D Echocardiogram has been performed. ? ?Ruben Duffy ?01/11/2022, 10:40 AM ?

## 2022-01-11 NOTE — Progress Notes (Signed)
ReDS Vest / Clip - 01/11/22 1200   ? ?  ? ReDS Vest / Clip  ? Station Marker D   ? Ruler Value 29   ? ReDS Value Range High volume overload   ? ReDS Actual Value 41   ? ?  ?  ? ?  ? ? ?

## 2022-02-01 ENCOUNTER — Institutional Professional Consult (permissible substitution): Payer: 59 | Admitting: Internal Medicine

## 2022-02-21 ENCOUNTER — Ambulatory Visit: Payer: 59 | Attending: Cardiology | Admitting: Cardiology

## 2022-02-21 DIAGNOSIS — I10 Essential (primary) hypertension: Secondary | ICD-10-CM | POA: Insufficient documentation

## 2022-02-21 DIAGNOSIS — G4733 Obstructive sleep apnea (adult) (pediatric): Secondary | ICD-10-CM | POA: Insufficient documentation

## 2022-02-21 DIAGNOSIS — R0683 Snoring: Secondary | ICD-10-CM | POA: Diagnosis present

## 2022-02-22 ENCOUNTER — Encounter: Payer: 59 | Admitting: Cardiology

## 2022-02-26 NOTE — Procedures (Signed)
? ?  Patient Name: Dial, Ruben Duffy ?Study Date:02/21/2022 ?Gender: Male ?D.O.B: 21-May-1966 ?Age (years): 54 ?Referring Provider: Fransico Him MD, ABSM ?Height (inches): 68 ?Interpreting Physician: Fransico Him MD, ABSM ?Weight (lbs): 207 ?RPSGT: Peak, Robert ?BMI: 31 ?MRN: [id number] ?Neck Size: 16.50 ? ?CLINICAL INFORMATION ?Sleep Study Type: NPSG ? ?Indication for sleep study: N/A ? ?Epworth Sleepiness Score: N/A ? ?SLEEP STUDY TECHNIQUE ?As per the AASM Manual for the Scoring of Sleep and Associated Events v2.3 (April 2016) with a hypopnea requiring 4% desaturations. ? ?The channels recorded and monitored were frontal, central and occipital EEG, electrooculogram (EOG), submentalis EMG (chin), nasal and oral airflow, thoracic and abdominal wall motion, anterior tibialis EMG, snore microphone, electrocardiogram, and pulse oximetry. ? ?MEDICATIONS ?Medications self-administered by patient taken the night of the study : N/A ? ?SLEEP ARCHITECTURE ?The study was initiated at 9:17:27 PM and ended at 5:01:52 AM. ? ?Sleep onset time was 19.5 minutes and the sleep efficiency was 84.9%. The total sleep time was 394.4 minutes. ? ?Stage REM latency was 212.5 minutes. ? ?The patient spent 2.03% of the night in stage N1 sleep, 83.39% in stage N2 sleep, 1.52% in stage N3 and 13.1% in REM. ? ?Alpha intrusion was absent. ? ?Supine sleep was 0.00%. ? ?RESPIRATORY PARAMETERS ?The overall apnea/hypopnea index (AHI) was 7.8 per hour. There were 25 total apneas, including 22 obstructive, 3 central and 0 mixed apneas. There were 26 hypopneas and 0 RERAs. ? ?The AHI during Stage REM sleep was 46.6 per hour. ? ?AHI while supine was N/A per hour. ? ?The mean oxygen saturation was 92.27%. The minimum SpO2 during sleep was 80.00%. ? ?moderate snoring was noted during this study. ? ?CARDIAC DATA ?The 2 lead EKG demonstrated sinus rhythm. The mean heart rate was 54.46 beats per minute. Other EKG findings include: PVCs. ? ?LEG MOVEMENT DATA ?The  total PLMS were 333 with a resulting PLMS index of 50.66. Associated arousal with leg movement index was 1.8 . ? ?IMPRESSIONS ?- Mild obstructive sleep apnea occurred during this study (AHI = 7.8/h). ?- No significant central sleep apnea occurred during this study (CAI = 0.5/h). ?- Moderate oxygen desaturation was noted during this study (Min O2 = 80.00%). ?- The patient snored with moderate snoring volume. ?- EKG findings include PVCs. ?- Severe periodic limb movements of sleep occurred during the study. No significant associated arousals. ? ?DIAGNOSIS ?- Obstructive Sleep Apnea (G47.33) ?- Nocturnal Hypoxemia (G47.36) ? ?RECOMMENDATIONS ?- Recommend CPAP titration.  ?- Avoid alcohol, sedatives and other CNS depressants that may worsen sleep apnea and disrupt normal sleep architecture. ?- Sleep hygiene should be reviewed to assess factors that may improve sleep quality. ?- Weight management and regular exercise should be initiated or continued if appropriate. ? ?[Electronically signed] 02/26/2022 09:13 PM ? ?Fransico Him MD, ABSM ?Diplomate, Tax adviser of Sleep Medicine ?

## 2022-03-07 ENCOUNTER — Ambulatory Visit (INDEPENDENT_AMBULATORY_CARE_PROVIDER_SITE_OTHER): Payer: 59 | Admitting: Orthopaedic Surgery

## 2022-03-07 ENCOUNTER — Ambulatory Visit (INDEPENDENT_AMBULATORY_CARE_PROVIDER_SITE_OTHER): Payer: 59

## 2022-03-07 ENCOUNTER — Encounter: Payer: Self-pay | Admitting: Internal Medicine

## 2022-03-07 ENCOUNTER — Ambulatory Visit: Payer: 59 | Admitting: Internal Medicine

## 2022-03-07 ENCOUNTER — Encounter: Payer: Self-pay | Admitting: Orthopaedic Surgery

## 2022-03-07 VITALS — BP 100/62 | HR 65 | Ht 68.0 in | Wt 197.0 lb

## 2022-03-07 VITALS — Ht 68.0 in | Wt 197.0 lb

## 2022-03-07 DIAGNOSIS — Z01812 Encounter for preprocedural laboratory examination: Secondary | ICD-10-CM

## 2022-03-07 DIAGNOSIS — G8929 Other chronic pain: Secondary | ICD-10-CM

## 2022-03-07 DIAGNOSIS — Z79899 Other long term (current) drug therapy: Secondary | ICD-10-CM | POA: Diagnosis not present

## 2022-03-07 DIAGNOSIS — M19011 Primary osteoarthritis, right shoulder: Secondary | ICD-10-CM | POA: Diagnosis not present

## 2022-03-07 DIAGNOSIS — I5022 Chronic systolic (congestive) heart failure: Secondary | ICD-10-CM

## 2022-03-07 DIAGNOSIS — M25511 Pain in right shoulder: Secondary | ICD-10-CM

## 2022-03-07 DIAGNOSIS — I447 Left bundle-branch block, unspecified: Secondary | ICD-10-CM

## 2022-03-07 DIAGNOSIS — R7989 Other specified abnormal findings of blood chemistry: Secondary | ICD-10-CM

## 2022-03-07 DIAGNOSIS — M545 Low back pain, unspecified: Secondary | ICD-10-CM

## 2022-03-07 DIAGNOSIS — C61 Malignant neoplasm of prostate: Secondary | ICD-10-CM

## 2022-03-07 MED ORDER — AMIODARONE HCL 200 MG PO TABS: 200.0000 mg | ORAL_TABLET | Freq: Every day | ORAL | 3 refills | Status: DC

## 2022-03-07 NOTE — Patient Instructions (Signed)
Medication Instructions:  Your physician has recommended you make the following change in your medication:   ** Decrease your Amiodarone to '200mg'$  - 1 tablet by mouth daily   *If you need a refill on your cardiac medications before your next appointment, please call your pharmacy*   Lab Work:  ** TSH and LIVER PANEL today  If you have labs (blood work) drawn today and your tests are completely normal, you will receive your results only by: [place] (if you have MyChart) OR A paper copy in the mail If you have any lab test that is abnormal or we need to change your treatment, we will call you to review the results.   Testing/Procedures: Your physician has recommended that you have a defibrillator inserted. An implantable cardioverter defibrillator (ICD) is a small device that is placed in your chest or, in rare cases, your abdomen. This device uses electrical pulses or shocks to help control life-threatening, irregular heartbeats that could lead the heart to suddenly stop beating (sudden cardiac arrest). Leads are attached to the ICD that goes into your heart. This is done in the hospital and usually requires an overnight stay. Please see the instruction sheet given to you today for more information.  CRT or cardiac resynchronization therapy is a treatment used to correct heart failure. When you have heart failure your heart is weakened and doesn't pump as well as it should. This therapy may help reduce symptoms and improve the quality of life.  Please see the handout/brochure given to you today to get more information of the different options of therapy.     Follow-Up: At [facility], you and your health needs are our priority.  As part of our continuing mission to provide you with exceptional heart care, we have created designated Provider Care Teams.  These Care Teams include your primary Cardiologist (physician) and Advanced Practice Providers (APPs -  Physician Assistants and  Nurse Practitioners) who all work together to provide you with the care you need, when you need it.  We recommend signing up for the patient portal called "MyChart".  Sign up information is provided on this After Visit Summary.  MyChart is used to connect with patients for Virtual Visits (Telemedicine).  Patients are able to view lab/test results, encounter notes, upcoming appointments, etc.  Non-urgent messages can be sent to your provider as well.   To learn more about what you can do with MyChart, go to [email or web address].    Your next appointment:   To be scheduled  Important Information About Sugar

## 2022-03-07 NOTE — Progress Notes (Signed)
Office Visit Note   Patient: Ruben Duffy           Date of Birth: Jun 06, 1966           MRN: [id number] Visit Date: 03/07/2022              Requested by: Bonnita Hollow, [place],  [place] [address] PCP: Bonnita Hollow, MD   Assessment & Plan: Visit Diagnoses:  1. Chronic right shoulder pain   2. Chronic left-sided low back pain, unspecified whether sciatica present   3. Primary osteoarthritis, right shoulder   4. Malignant neoplasm of prostate [facility])     Plan: With patient's chronic heart failure low ejection fraction he is not really a candidate for surgery.  We will see if we get an MRI before he gets is likely pacemaker so we can at least identify where epidurals most likely to be effective.  He does have a history of prostate cancer.  Office follow-up after MRI scan lumbar.  Follow-Up Instructions: No follow-ups on file.   Orders:  Orders Placed This Encounter  Procedures   XR Lumbar Spine 2-3 Views   XR Shoulder Right   MR Lumbar Spine w/o contrast   No orders of the defined types were placed in this encounter.     Procedures: No procedures performed   Clinical Data: No additional findings.   Subjective: Chief Complaint  Patient presents with   Lower Back - Pain   Right Shoulder - Pain    HPI 56 year old male with chronic back pain x3 to 4 years.  Is also had right shoulder pain since his dislocation in the 80s with difficulty with reaching across his chest.  He had used ibuprofen in the past also tramadol and Flexeril.  Has a history of cardiac problems also stage IIb prostate cancer.  Chronic heart failure and a one-point ejection fraction of 25%.  He states he may be getting a pacemaker shortly.  Patient has multilevel lumbar disc degeneration and is wanting to know if he is a candidate for an epidural steroid injection.  He has back pain leg pain that radiates into the left buttocks into the left leg sometimes below the knee.   With his cardiac problems he really does not give me a history of neurogenic claudication since heart failure limits some of his ambulation.  He gets dyspnea with flat walking 2 flights of stairs.  Patient has been through ibuprofen duloxetine tramadol and Flexeril without relief.  He has been through physical therapy multiple visits without relief of his chronic back pain.  Review of Systems all the systems noncontributory to HPI.   Objective: Vital Signs: Ht '5\' 8"'$  (1.727 m)   Wt 197 lb (89.4 kg)   BMI 29.95 kg/m   Physical Exam Constitutional:      Appearance: He is well-developed.  HENT:     Head: Normocephalic and atraumatic.     Right Ear: External ear normal.     Left Ear: External ear normal.  Eyes:     Pupils: Pupils are equal, round, and reactive to light.  Neck:     Thyroid: No thyromegaly.     Trachea: No tracheal deviation.  Pulmonary:     Effort: Pulmonary effort is normal.     Breath sounds: No wheezing.  Abdominal:     General: Bowel sounds are normal.     Palpations: Abdomen is soft.  Musculoskeletal:     Cervical back: Neck supple.  Skin:    General: Skin is warm and dry.     Capillary Refill: Capillary refill takes less than 2 seconds.  Neurological:     Mental Status: He is alert and oriented to person, place, and time.  Psychiatric:        Behavior: Behavior normal.        Thought Content: Thought content normal.        Judgment: Judgment normal.    Ortho Exam anterior tib is intact negative straight leg raising negative logroll hips.  Positive right shoulder apprehension with abduction external rotation.  Discomfort reaching across his opposite left shoulder with his hand.  Specialty Comments:  No specialty comments available.  Imaging: XR Lumbar Spine 2-3 Views  Result Date: 03/07/2022 AP lateral lumbar images demonstrate some mild lumbar curvature.  Large endplate spurs on the right at L3-4 with endplate sclerosis.  Facet arthropathy noted.   Hip joints pelvis is normal patient has some grade 1 anterolisthesis at L5-S1. : Lumbar disc degeneration most pronounced at L3-4.  Endplates show sclerosis this appears more degenerative than suggestive of metastatic changes.  L5-S1 grade 1 spondylolisthesis.  XR Shoulder Right  Result Date: 03/07/2022 2 views right shoulder demonstrate some glenohumeral joint space narrowing.  No rotator cuff calcification.  Marginal osteophyte subchondral sclerosis noted glenohumeral joint.  Acromioclavicular joint is unremarkable. Impression: Right shoulder osteoarthritis.    PMFS History: Patient Active Problem List   Diagnosis Date Noted   Primary osteoarthritis, right shoulder 96/75/9163   Chronic systolic heart failure ([place]) 09/19/2021   New onset of congestive heart failure ([place]) 09/07/2021   Acute CHF (congestive heart failure) ([place]) 09/07/2021   Left bundle branch block 05/09/2021   Essential hypertension 05/09/2021   Preoperative clearance 05/09/2021   Malignant neoplasm of prostate ([place]) 02/23/2021   Past Medical History:  Diagnosis Date   Arthritis    CHF (congestive heart failure) ([facility])    Emphysema of lung ([facility])    GERD (gastroesophageal reflux disease)    Hypertension    Hypospadias, balanic    chronic   LBBB (left bundle branch block)    newly dx by ekg done 04-26-2021 for surgery on 06-05-2021,  pt stated he not had a previous ekg before, so pt sent for cardiac clearance prior to surgery;  cardiologist , dr berry, office note clearance in epic 05-09-2021 gave pt clearance with no further work-up needed   Lower urinary tract symptoms (LUTS)    Prostate cancer [facility])    urologist-- dr Jeffie Pollock--- dx by bx 01-08-2021, Gleason 3+4, T2a   Substance abuse ([place])     Family History  Problem Relation Age of Onset   Stroke Father    Lung cancer Maternal Aunt    Diabetes Maternal Uncle    Brain cancer Maternal Uncle    Lung cancer Maternal Uncle    Lung cancer Maternal Uncle    Cancer  Paternal Grandmother    Diabetes Paternal Grandfather    Colon cancer Neg Hx    Esophageal cancer Neg Hx    Stomach cancer Neg Hx    Pancreatic cancer Neg Hx    Colon polyps Neg Hx    Rectal cancer Neg Hx     Past Surgical History:  Procedure Laterality Date   BIOPSY PROSTATE     CIRCUMCISION N/A 06/05/2021   Procedure: CIRCUMCISION ADULT;  Surgeon: Irine Seal, MD;  Location: [facility];  Service: Urology;  Laterality: N/A;   HEMORRHOID SURGERY  HYPOSPADIAS CORRECTION     RADIOACTIVE SEED IMPLANT N/A 06/05/2021   Procedure: RADIOACTIVE SEED IMPLANT/BRACHYTHERAPY IMPLANT;  Surgeon: Irine Seal, MD;  Location: [facility];  Service: Urology;  Laterality: N/A;   RIGHT/LEFT HEART CATH AND CORONARY ANGIOGRAPHY N/A 09/10/2021   Procedure: RIGHT/LEFT HEART CATH AND CORONARY ANGIOGRAPHY;  Surgeon: Jolaine Artist, MD;  Location: [place];  Service: Cardiovascular;  Laterality: N/A;   SPACE OAR INSTILLATION N/A 06/05/2021   Procedure: SPACE OAR INSTILLATION;  Surgeon: Irine Seal, MD;  Location: [facility];  Service: Urology;  Laterality: N/A;   Social History   Occupational History   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  Tobacco Use   Smoking status: Former    Packs/day: 1.50    Years: 30.00    Pack years: 45.00    Types: Cigarettes    Quit date: 2009    Years since quitting: 14.4   Smokeless tobacco: Never  Vaping Use   Vaping Use: Never used  Substance and Sexual Activity   Alcohol use: Not Currently   Drug use: Not Currently    Types: Marijuana, "Crack" cocaine   Sexual activity: Yes

## 2022-03-07 NOTE — Progress Notes (Signed)
ELECTROPHYSIOLOGY CONSULT NOTE  Patient ID: Ruben Duffy, MRN: [id number], DOB/AGE: 01-31-1966 56 y.o. Admit date: (Not on file) Date of Consult: 03/07/2022  Primary Physician: Bonnita Hollow, MD Primary Cardiologist: DB     Ruben Duffy is a 56 y.o. male who is being seen today for the evaluation of CRT-D at the request of DB.    HPI Ruben Duffy is a 56 y.o. male referred for consideration of CRT-D  Presented with acute congestive heart failure 11/22, Noted to have left bundle branch block and frequent PVCs the latter treated with amiodarone.    Thought to have a PVC/left bundle branch block cardiomyopathy, treated with guideline directed therapy and discharged with a LifeVest Much improved.  Able to walk up to 100 yards, some dyspnea on exertion after that, unable to walk 2 flights of stairs indeed probably not 1 either without dyspnea.  No nocturnal dyspnea orthopnea peripheral edema.  No palpitations or syncope.    DATE TEST EF   11/22 Echo  20-25% MR severe central  11/22 LHC   Non obstructive CAD  11/22 cMRI 14% Biventricular failure   3/23 Echo  25-30% MR mild- mod   Date Cr K Hgb Dig  11/22   13.7   3/23 1.02 4.9    0.4             Past Medical History:  Diagnosis Date   Arthritis    CHF (congestive heart failure) (HCC)    Emphysema of lung (HCC)    GERD (gastroesophageal reflux disease)    Hypertension    Hypospadias, balanic    chronic   LBBB (left bundle branch block)    newly dx by ekg done 04-26-2021 for surgery on 06-05-2021,  pt stated he not had a previous ekg before, so pt sent for cardiac clearance prior to surgery;  cardiologist , dr berry, office note clearance in epic 05-09-2021 gave pt clearance with no further work-up needed   Lower urinary tract symptoms (LUTS)    Prostate cancer [facility])    urologist-- dr Jeffie Pollock--- dx by bx 01-08-2021, Gleason 3+4, T2a   Substance abuse ([place])       Surgical History:  Past Surgical History:   Procedure Laterality Date   BIOPSY PROSTATE     CIRCUMCISION N/A 06/05/2021   Procedure: CIRCUMCISION ADULT;  Surgeon: Irine Seal, MD;  Location: [facility];  Service: Urology;  Laterality: N/A;   HEMORRHOID SURGERY     HYPOSPADIAS CORRECTION     RADIOACTIVE SEED IMPLANT N/A 06/05/2021   Procedure: RADIOACTIVE SEED IMPLANT/BRACHYTHERAPY IMPLANT;  Surgeon: Irine Seal, MD;  Location: [facility];  Service: Urology;  Laterality: N/A;   RIGHT/LEFT HEART CATH AND CORONARY ANGIOGRAPHY N/A 09/10/2021   Procedure: RIGHT/LEFT HEART CATH AND CORONARY ANGIOGRAPHY;  Surgeon: Jolaine Artist, MD;  Location: [facility] [facility];  Service: Cardiovascular;  Laterality: N/A;   SPACE OAR INSTILLATION N/A 06/05/2021   Procedure: SPACE OAR INSTILLATION;  Surgeon: Irine Seal, MD;  Location: [facility];  Service: Urology;  Laterality: N/A;     Home Meds: Current Meds  Medication Sig   amiodarone (PACERONE) 200 MG tablet Take 1 tablet (200 mg total) by mouth 2 (two) times daily.   atorvastatin (LIPITOR) 40 MG tablet Take 1 tablet (40 mg total) by mouth daily.   carvedilol (COREG) 3.125 MG tablet Take 1 tablet (3.125 mg total) by mouth 2 (two) times daily with a meal.  cyclobenzaprine (FLEXERIL) 5 MG tablet Take 5 mg by mouth 3 (three) times daily as needed.   dapagliflozin propanediol (FARXIGA) 10 MG TABS tablet Take 1 tablet (10 mg total) by mouth daily.   digoxin (LANOXIN) 0.125 MG tablet Take 0.5 tablets (0.0625 mg total) by mouth daily.   DULoxetine (CYMBALTA) 60 MG capsule Take 60 mg by mouth daily.   furosemide (LASIX) 40 MG tablet Take 1 tablet (40 mg total) by mouth daily.   magnesium oxide (MAG-OX) 400 MG tablet Take 1/2 tablet (200 mg total) by mouth daily.   Multiple Vitamin (MULTIVITAMIN ADULT PO) Take by mouth.   omeprazole (PRILOSEC) 40 MG capsule Take 1 capsule by mouth daily.   potassium chloride SA (KLOR-CON M) 20 MEQ tablet Take 1 tablet (20  mEq total) by mouth daily.   sacubitril-valsartan (ENTRESTO) 49-51 MG Take 1 tablet by mouth 2 (two) times daily.   silodosin (RAPAFLO) 8 MG CAPS capsule Take 1 capsule (8 mg total) by mouth daily with breakfast.   spironolactone (ALDACTONE) 25 MG tablet Take 1 tablet (25 mg total) by mouth daily.   traMADol (ULTRAM) 50 MG tablet Take 50 mg by mouth 2 (two) times daily as needed.   triamcinolone (KENALOG) 0.025 % ointment Apply topically daily as needed (itching, rash).   valACYclovir (VALTREX) 500 MG tablet Take 500 mg by mouth 2 (two) times daily as needed.    Allergies: No Known Allergies  Social History   Socioeconomic History   Marital status: Single    Spouse name: Not on file   Number of children: 1   Years of education: Not on file   Highest education level: Not on file  Occupational History   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  Tobacco Use   Smoking status: Former    Packs/day: 1.50    Years: 30.00    Pack years: 45.00    Types: Cigarettes    Quit date: 2009    Years since quitting: 14.4   Smokeless tobacco: Never  Vaping Use   Vaping Use: Never used  Substance and Sexual Activity   Alcohol use: Not Currently   Drug use: Not Currently    Types: Marijuana, "Crack" cocaine   Sexual activity: Yes  Other Topics Concern   Not on file  Social History Narrative   Not on file   Social Determinants of Health   Financial Resource Strain: Low Risk    Difficulty of Paying Living Expenses: Not hard at all  Food Insecurity: No Food Insecurity   Worried About Charity fundraiser in the Last Year: Never true   [place] in the Last Year: Never true  Transportation Needs: No Transportation Needs   Lack of Transportation (Medical): No   Lack of Transportation (Non-Medical): No  Physical Activity: Inactive   Days of Exercise per Week: 0 days   Minutes of Exercise per Session: 0 min  Stress: No Stress Concern Present   Feeling of Stress :  Not at all  Social Connections: Socially Isolated   Frequency of Communication with Friends and Family: Three times a week   Frequency of Social Gatherings with Friends and Family: Three times a week   Attends Religious Services: Never   Active Member of Clubs or Organizations: No   Attends Archivist Meetings: Never   Marital Status: Never married  Intimate Partner Violence: Not At Risk   Fear of Current or Ex-Partner: No   Emotionally  Abused: No   Physically Abused: No   Sexually Abused: No     Family History  Problem Relation Age of Onset   Stroke Father    Lung cancer Maternal Aunt    Diabetes Maternal Uncle    Brain cancer Maternal Uncle    Lung cancer Maternal Uncle    Lung cancer Maternal Uncle    Cancer Paternal Grandmother    Diabetes Paternal Grandfather    Colon cancer Neg Hx    Esophageal cancer Neg Hx    Stomach cancer Neg Hx    Pancreatic cancer Neg Hx    Colon polyps Neg Hx    Rectal cancer Neg Hx      ROS:  Please see the history of present illness.     All other systems reviewed and negative.    Physical Exam: Blood pressure 100/62, pulse 65, height '5\' 8"'$  (1.727 m), weight 197 lb (89.4 kg), SpO2 98 %. General: Well developed, well nourished male in no acute distress. Head: Normocephalic, atraumatic, sclera non-icteric, no xanthomas, nares are without discharge. EENT: normal  Lymph Nodes:  none Neck: Negative for carotid bruits. JVD not elevated. Back:without scoliosis kyphosis Lungs: Clear bilaterally to auscultation without wheezes, rales, or rhonchi. Breathing is unlabored. Heart: RRR with S1 S2. No murmur . No rubs, or gallops appreciated. Abdomen: Soft, non-tender, non-distended with normoactive bowel sounds. No hepatomegaly. No rebound/guarding. No obvious abdominal masses. Msk:  Strength and tone appear normal for age. Extremities: No clubbing or cyanosis edema.  Distal pedal pulses are 2+ and equal bilaterally. Skin: Warm and  Dry Neuro: Alert and oriented X 3. CN III-XII intact Grossly normal sensory and motor function . Psych:  Responds to questions appropriately with a normal affect.        EKG:  3/23 sinus at 63 left bundle branch block with fractionation QRSd 186 ms  11/22 sinus with left bundle branch block, QRSd 174 ms Assessment and Plan:  Nonischemic cardiomyopathy with biventricular involvement  Left bundle branch block fractionation  Congestive heart failure-systolic-class 2b  MR mild-moderate  PVCs  Patient has persistent left ventricular dysfunction despite guideline directed medical therapy.  He is appropriately considered for CRT-D.  Unfortunately, with suppression of his PVCs there has not been improvement in his left ventricular function.  Hopefully with resynchronization we will get significant improvement.  This is probably 65-75% likely.  With his PVC suppression, we will decrease his amiodarone from 400--200 mg a day; he needs amiodarone surveillance laboratories.  This may have an effect on his digoxin level and we will have to follow this along.  Have reviewed the potential benefits and risks of ICD implantation including but not limited to death, perforation of heart or lung, lead dislodgement, infection,  device malfunction and inappropriate shocks.  The patient and family express understanding  and are willing to proceed.       Virl Axe

## 2022-03-08 ENCOUNTER — Other Ambulatory Visit (HOSPITAL_COMMUNITY): Payer: Self-pay | Admitting: Orthopaedic Surgery

## 2022-03-08 ENCOUNTER — Other Ambulatory Visit: Payer: Self-pay | Admitting: Orthopaedic Surgery

## 2022-03-08 ENCOUNTER — Ambulatory Visit (HOSPITAL_COMMUNITY)
Admission: RE | Admit: 2022-03-08 | Discharge: 2022-03-08 | Disposition: A | Discharge status: Home / Self Care | Payer: 59 | Source: Ambulatory Visit | Attending: Orthopaedic Surgery | Admitting: Orthopaedic Surgery

## 2022-03-08 DIAGNOSIS — G8929 Other chronic pain: Secondary | ICD-10-CM | POA: Diagnosis present

## 2022-03-08 DIAGNOSIS — M5136 Other intervertebral disc degeneration, lumbar region: Secondary | ICD-10-CM | POA: Diagnosis not present

## 2022-03-08 DIAGNOSIS — M47816 Spondylosis without myelopathy or radiculopathy, lumbar region: Secondary | ICD-10-CM | POA: Diagnosis not present

## 2022-03-08 DIAGNOSIS — M48061 Spinal stenosis, lumbar region without neurogenic claudication: Secondary | ICD-10-CM | POA: Insufficient documentation

## 2022-03-08 DIAGNOSIS — M47817 Spondylosis without myelopathy or radiculopathy, lumbosacral region: Secondary | ICD-10-CM | POA: Diagnosis not present

## 2022-03-08 DIAGNOSIS — M545 Low back pain, unspecified: Secondary | ICD-10-CM | POA: Insufficient documentation

## 2022-03-08 LAB — HEPATIC FUNCTION PANEL
ALT: 79 IU/L — ABNORMAL HIGH (ref 0–44)
AST: 47 IU/L — ABNORMAL HIGH (ref 0–40)
Albumin: 4.6 g/dL (ref 3.8–4.9)
Alkaline Phosphatase: 79 IU/L (ref 44–121)
Bilirubin Total: 0.4 mg/dL (ref 0.0–1.2)
Bilirubin, Direct: 0.15 mg/dL (ref 0.00–0.40)
Total Protein: 7.3 g/dL (ref 6.0–8.5)

## 2022-03-08 LAB — TSH: TSH: 1.47 u[IU]/mL (ref 0.450–4.500)

## 2022-03-12 ENCOUNTER — Telehealth: Payer: Self-pay

## 2022-03-12 NOTE — Telephone Encounter (Signed)
-----   Message from Deboraha Sprang, MD sent at 03/09/2022  4:15 PM EDT -----  Please inform patient that drug surveillance labs are abnormal with elevated liver tests  We have reduced the dose of the amiodarone at the office visit and will check your blood work again in 4 weeks  Thanks SK

## 2022-03-12 NOTE — Addendum Note (Signed)
Addended by: Thora Lance on: 03/12/2022 12:52 PM   Modules accepted: Orders

## 2022-03-12 NOTE — Telephone Encounter (Signed)
Spoke with pt and advised of Lab results as below per Dr Caryl Comes.  Pt is scheduled for labs on 03/29/2022 prior to ICD implant. CMET order placed to re-evaluate liver function.  Pt verbalizes understanding and agrees with current plan.

## 2022-03-14 ENCOUNTER — Other Ambulatory Visit (HOSPITAL_COMMUNITY): Payer: Self-pay | Admitting: Family Medicine

## 2022-03-14 ENCOUNTER — Telehealth: Payer: Self-pay | Admitting: *Deleted

## 2022-03-14 DIAGNOSIS — R0683 Snoring: Secondary | ICD-10-CM

## 2022-03-14 DIAGNOSIS — G4733 Obstructive sleep apnea (adult) (pediatric): Secondary | ICD-10-CM

## 2022-03-14 NOTE — Telephone Encounter (Addendum)
The patient has been notified of the result and verbalized understanding.  All questions (if any) were answered. Marolyn Hammock, [place] 03/14/2022 5:50 PM    titration sent to [employer]

## 2022-03-14 NOTE — Telephone Encounter (Signed)
-----   Message from Lauralee Evener, [place] sent at 02/28/2022  8:05 AM EDT -----  ----- Message ----- From: Sueanne Margarita, MD Sent: 02/26/2022   9:15 PM EDT To: Cv Div Sleep Studies  Please let patient know that they have sleep apnea.  Recommend therapeutic CPAP titration for treatment of patient's sleep disordered breathing.  If unable to perform an in lab titration then initiate ResMed auto CPAP from 4 to 15cm H2O with heated humidity and mask of choice and overnight pulse ox on CPAP.

## 2022-03-15 NOTE — Telephone Encounter (Signed)
Prior Authorization for TITRATION sent to [employer] via Fax. APPROVED [place] READ  3/30- 7/30

## 2022-03-28 ENCOUNTER — Other Ambulatory Visit: Payer: 59

## 2022-03-28 DIAGNOSIS — C61 Malignant neoplasm of prostate: Secondary | ICD-10-CM

## 2022-03-29 ENCOUNTER — Other Ambulatory Visit: Payer: 59 | Admitting: *Deleted

## 2022-03-29 ENCOUNTER — Encounter (HOSPITAL_COMMUNITY): Payer: 59

## 2022-03-29 DIAGNOSIS — Z79899 Other long term (current) drug therapy: Secondary | ICD-10-CM

## 2022-03-29 DIAGNOSIS — Z01812 Encounter for preprocedural laboratory examination: Secondary | ICD-10-CM

## 2022-03-29 DIAGNOSIS — I5022 Chronic systolic (congestive) heart failure: Secondary | ICD-10-CM

## 2022-03-29 DIAGNOSIS — I447 Left bundle-branch block, unspecified: Secondary | ICD-10-CM

## 2022-03-29 DIAGNOSIS — R7989 Other specified abnormal findings of blood chemistry: Secondary | ICD-10-CM

## 2022-03-29 LAB — PSA: Prostate Specific Ag, Serum: 0.3 ng/mL (ref 0.0–4.0)

## 2022-03-30 LAB — COMPREHENSIVE METABOLIC PANEL
ALT: 80 IU/L — ABNORMAL HIGH (ref 0–44)
AST: 47 IU/L — ABNORMAL HIGH (ref 0–40)
Albumin/Globulin Ratio: 1.7 (ref 1.2–2.2)
Albumin: 4.5 g/dL (ref 3.8–4.9)
Alkaline Phosphatase: 86 IU/L (ref 44–121)
BUN/Creatinine Ratio: 9 (ref 9–20)
BUN: 9 mg/dL (ref 6–24)
Bilirubin Total: 0.6 mg/dL (ref 0.0–1.2)
CO2: 24 mmol/L (ref 20–29)
Calcium: 9.7 mg/dL (ref 8.7–10.2)
Chloride: 103 mmol/L (ref 96–106)
Creatinine, Ser: 1 mg/dL (ref 0.76–1.27)
Globulin, Total: 2.6 g/dL (ref 1.5–4.5)
Glucose: 95 mg/dL (ref 70–99)
Potassium: 4.5 mmol/L (ref 3.5–5.2)
Sodium: 141 mmol/L (ref 134–144)
Total Protein: 7.1 g/dL (ref 6.0–8.5)
eGFR: 88 mL/min/{1.73_m2} (ref >59)

## 2022-03-30 LAB — CBC
Hematocrit: 44.5 % (ref 37.5–51.0)
Hemoglobin: 14.3 g/dL (ref 13.0–17.7)
MCH: 27.2 pg (ref 26.6–33.0)
MCHC: 32.1 g/dL (ref 31.5–35.7)
MCV: 85 fL (ref 79–97)
Platelets: 306 10*3/uL (ref 150–450)
RBC: 5.25 x10E6/uL (ref 4.14–5.80)
RDW: 14.4 % (ref 11.6–15.4)
WBC: 9.1 10*3/uL (ref 3.4–10.8)

## 2022-04-04 ENCOUNTER — Ambulatory Visit (INDEPENDENT_AMBULATORY_CARE_PROVIDER_SITE_OTHER): Payer: 59 | Admitting: Urology

## 2022-04-04 ENCOUNTER — Ambulatory Visit: Payer: 59 | Admitting: Urology

## 2022-04-04 VITALS — BP 101/57 | HR 69

## 2022-04-04 DIAGNOSIS — C61 Malignant neoplasm of prostate: Secondary | ICD-10-CM

## 2022-04-04 DIAGNOSIS — R350 Frequency of micturition: Secondary | ICD-10-CM

## 2022-04-04 DIAGNOSIS — N3281 Overactive bladder: Secondary | ICD-10-CM

## 2022-04-04 DIAGNOSIS — R3912 Poor urinary stream: Secondary | ICD-10-CM | POA: Diagnosis not present

## 2022-04-04 DIAGNOSIS — N3941 Urge incontinence: Secondary | ICD-10-CM

## 2022-04-04 LAB — URINALYSIS, ROUTINE W REFLEX MICROSCOPIC
Bilirubin, UA: NEGATIVE
Ketones, UA: NEGATIVE
Leukocytes,UA: NEGATIVE
Nitrite, UA: NEGATIVE
Protein,UA: NEGATIVE
RBC, UA: NEGATIVE
Specific Gravity, UA: 1.01 (ref 1.005–1.030)
Urobilinogen, Ur: 0.2 mg/dL (ref 0.2–1.0)
pH, UA: 7 (ref 5.0–7.5)

## 2022-04-04 MED ORDER — MIRABEGRON ER 25 MG PO TB24: 25.0000 mg | ORAL_TABLET | Freq: Every day | ORAL | 0 refills | Status: DC

## 2022-04-04 NOTE — Progress Notes (Unsigned)
Subjective: 1. Prostate cancer ([place])   2. Overactive bladder   3. Urge incontinence   4. Weak urinary stream   5. Urinary frequency      04/04/22: Hutson returns today in f/u.  His PSA continues to fall and is down to 0.3 from 0.7.  He continues to have moderate LUTS with an IPSS of 18.  The frequency is worse with coffee and he can have UUI.  He remains on silodosin '8mg'$ .  He has had no hematuria.  He has some fecal urgency.  He has no weight loss or bone pain.    01/10/22: Jomarion returns today in f/u.  He has previously been seen in [place] for his history of T2a Nx Mx GG2 prostate cancer treated with seeds on 05/15/21.  He had a circumcision at that time as well.  He continues to have moderate to severe LUTS with an IPSS of 20.  He had some burning over the last few days.  He has had no hematuria.  He remains on tamsulosin but hasn't had much improvement.  He had CHF in November and is seeing Dr. Haroldine Laws for that.   He has some recurrent SOB over the last few days.  He has no edema.   His PSA was down to 0.69 in January from 1.57 preop.  He is doing ok with the circumcision but he still has some redundant skin.   UA today has 3+ glucose but is otherwise clear.   His Hgb A1c was only 6.0 on 09/08/21.   He has some polydipsia on the Farxiga.      ROS:  Review of Systems  Constitutional:  Positive for malaise/fatigue.  Respiratory:  Positive for shortness of breath.   Musculoskeletal:  Positive for back pain and joint pain.  Neurological:  Positive for weakness.  Endo/Heme/Allergies:  Positive for polydipsia. Bruises/bleeds easily.  All other systems reviewed and are negative.   No Known Allergies  Past Medical History:  Diagnosis Date   Arthritis    CHF (congestive heart failure) (HCC)    Emphysema of lung (HCC)    GERD (gastroesophageal reflux disease)    Hypertension    Hypospadias, balanic    chronic   LBBB (left bundle branch block)    newly dx by ekg done 04-26-2021 for  surgery on 06-05-2021,  pt stated he not had a previous ekg before, so pt sent for cardiac clearance prior to surgery;  cardiologist , dr berry, office note clearance in epic 05-09-2021 gave pt clearance with no further work-up needed   Lower urinary tract symptoms (LUTS)    Prostate cancer [facility])    urologist-- dr Jeffie Pollock--- dx by bx 01-08-2021, Gleason 3+4, T2a   Substance abuse [facility])     Past Surgical History:  Procedure Laterality Date   BIOPSY PROSTATE     CIRCUMCISION N/A 06/05/2021   Procedure: CIRCUMCISION ADULT;  Surgeon: Irine Seal, MD;  Location: [facility];  Service: Urology;  Laterality: N/A;   HEMORRHOID SURGERY     HYPOSPADIAS CORRECTION     RADIOACTIVE SEED IMPLANT N/A 06/05/2021   Procedure: RADIOACTIVE SEED IMPLANT/BRACHYTHERAPY IMPLANT;  Surgeon: Irine Seal, MD;  Location: [facility];  Service: Urology;  Laterality: N/A;   RIGHT/LEFT HEART CATH AND CORONARY ANGIOGRAPHY N/A 09/10/2021   Procedure: RIGHT/LEFT HEART CATH AND CORONARY ANGIOGRAPHY;  Surgeon: Jolaine Artist, MD;  Location: [facility];  Service: Cardiovascular;  Laterality: N/A;   SPACE OAR INSTILLATION N/A 06/05/2021  Procedure: SPACE OAR INSTILLATION;  Surgeon: Irine Seal, MD;  Location: [facility];  Service: Urology;  Laterality: N/A;    Social History   Socioeconomic History   Marital status: Single    Spouse name: Not on file   Number of children: 1   Years of education: Not on file   Highest education level: Not on file  Occupational History   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  Tobacco Use   Smoking status: Former    Packs/day: 1.50    Years: 30.00    Total pack years: 45.00    Types: Cigarettes    Quit date: 2009    Years since quitting: 14.4   Smokeless tobacco: Never  Vaping Use   Vaping Use: Never used  Substance and Sexual Activity   Alcohol use: Not Currently   Drug use: Not Currently    Types:  Marijuana, "Crack" cocaine   Sexual activity: Yes  Other Topics Concern   Not on file  Social History Narrative   Not on file   Social Determinants of Health   Financial Resource Strain: Low Risk  (07/27/2021)   Overall Financial Resource Strain (CARDIA)    Difficulty of Paying Living Expenses: Not hard at all  Food Insecurity: No Food Insecurity (07/27/2021)   Hunger Vital Sign    Worried About Running Out of Food in the Last Year: Never true    [place] in the Last Year: Never true  Transportation Needs: No Transportation Needs (07/27/2021)   PRAPARE - Hydrologist (Medical): No    Lack of Transportation (Non-Medical): No  Physical Activity: Inactive (07/27/2021)   Exercise Vital Sign    Days of Exercise per Week: 0 days    Minutes of Exercise per Session: 0 min  Stress: No Stress Concern Present (07/27/2021)   [place]    Feeling of Stress : Not at all  Social Connections: Socially Isolated (07/27/2021)   Social Connection and Isolation Panel [NHANES]    Frequency of Communication with Friends and Family: Three times a week    Frequency of Social Gatherings with Friends and Family: Three times a week    Attends Religious Services: Never    Active Member of Clubs or Organizations: No    Attends Archivist Meetings: Never    Marital Status: Never married  Intimate Partner Violence: Not At Risk (07/27/2021)   Humiliation, Afraid, Rape, and Kick questionnaire    Fear of Current or Ex-Partner: No    Emotionally Abused: No    Physically Abused: No    Sexually Abused: No    Family History  Problem Relation Age of Onset   Stroke Father    Lung cancer Maternal Aunt    Diabetes Maternal Uncle    Brain cancer Maternal Uncle    Lung cancer Maternal Uncle    Lung cancer Maternal Uncle    Cancer Paternal Grandmother    Diabetes Paternal Grandfather    Colon cancer  Neg Hx    Esophageal cancer Neg Hx    Stomach cancer Neg Hx    Pancreatic cancer Neg Hx    Colon polyps Neg Hx    Rectal cancer Neg Hx     Anti-infectives: Anti-infectives (From admission, onward)    None       Current Outpatient Medications  Medication Sig Dispense Refill   mirabegron ER (MYRBETRIQ)  25 MG TB24 tablet Take 1 tablet (25 mg total) by mouth daily. 28 tablet 0   amiodarone (PACERONE) 200 MG tablet Take 1 tablet (200 mg total) by mouth daily. 90 tablet 3   atorvastatin (LIPITOR) 40 MG tablet Take 1 tablet (40 mg total) by mouth daily. 40 tablet 6   carvedilol (COREG) 3.125 MG tablet TAKE 1 TABLET BY MOUTH TWICE DAILY WITH A MEAL 180 tablet 3   cyclobenzaprine (FLEXERIL) 5 MG tablet Take 10 mg by mouth 3 (three) times daily as needed for muscle spasms.     dapagliflozin propanediol (FARXIGA) 10 MG TABS tablet Take 1 tablet (10 mg total) by mouth daily. 90 tablet 1   digoxin (LANOXIN) 0.125 MG tablet Take 0.5 tablets (0.0625 mg total) by mouth daily. 30 tablet 6   DULoxetine (CYMBALTA) 60 MG capsule Take 60 mg by mouth daily.     furosemide (LASIX) 40 MG tablet Take 1 tablet (40 mg total) by mouth daily. 30 tablet 6   magnesium oxide (MAG-OX) 400 MG tablet Take 1/2 tablet (200 mg total) by mouth daily. 30 tablet 6   Multiple Vitamin (MULTIVITAMIN ADULT PO) Take by mouth.     omeprazole (PRILOSEC) 40 MG capsule Take 40 mg by mouth daily.     potassium chloride SA (KLOR-CON M) 20 MEQ tablet Take 1 tablet (20 mEq total) by mouth daily. 30 tablet 6   sacubitril-valsartan (ENTRESTO) 49-51 MG Take 1 tablet by mouth 2 (two) times daily. 60 tablet 6   silodosin (RAPAFLO) 8 MG CAPS capsule Take 1 capsule (8 mg total) by mouth daily with breakfast. 30 capsule 11   spironolactone (ALDACTONE) 25 MG tablet Take 1 tablet (25 mg total) by mouth daily. 30 tablet 6   traMADol (ULTRAM) 50 MG tablet Take 50 mg by mouth 2 (two) times daily as needed.     triamcinolone (KENALOG) 0.025 %  ointment Apply topically daily as needed (itching, rash).     valACYclovir (VALTREX) 500 MG tablet Take 500 mg by mouth 2 (two) times daily as needed.     No current facility-administered medications for this visit.     Objective: Vital signs in last 24 hours: BP (!) 101/57   Pulse 69   Intake/Output from previous day: No intake/output data recorded. Intake/Output this shift: '@IOTHISSHIFT'$ @   Physical Exam  Lab Results:  No results found for this or any previous visit (from the past 24 hour(s)).   BMET No results for input(s): "NA", "K", "CL", "CO2", "GLUCOSE", "BUN", "CREATININE", "CALCIUM" in the last 72 hours. PT/INR No results for input(s): "LABPROT", "INR" in the last 72 hours. ABG No results for input(s): "PHART", "HCO3" in the last 72 hours.  Invalid input(s): "PCO2", "PO2" Lab Results  Component Value Date   PSA1 0.3 03/28/2022   PSA1 0.7 01/10/2022      UA has 2+ Glucose.  Studies/Results: No results found.   Assessment/Plan: Prostate cancer.  His PSA is falling nicely s/p seeds.  He will return in 6 months.   BPH with BOO and OAB.  He has persistent LUTS with frequency and urgency on silodosin.   I will give him Myrbetriq '25mg'$  samples to try.  Side effects reviewed.   Glucosuria.  He is on [place] for his CHF and the glucosuria is probably contributing to his irritative voiding symptoms.    Meds ordered this encounter  Medications   mirabegron ER (MYRBETRIQ) 25 MG TB24 tablet    Sig: Take 1 tablet (25 mg  total) by mouth daily.    Dispense:  28 tablet    Refill:  0     Orders Placed This Encounter  Procedures   Urinalysis, Routine w reflex microscopic   PSA    Standing Status:   Future    Standing Expiration Date:   04/05/2023     Return in about 6 months (around 10/04/2022) for with PSA.    CC: Dr. Aggie Hacker at [facility].      Irine Seal 04/04/2022 [phone]

## 2022-04-05 ENCOUNTER — Ambulatory Visit (HOSPITAL_COMMUNITY)
Admission: RE | Admit: 2022-04-05 | Discharge: 2022-04-05 | Disposition: A | Discharge status: Home / Self Care | Payer: 59 | Source: Ambulatory Visit | Attending: Family Medicine | Admitting: Family Medicine

## 2022-04-05 ENCOUNTER — Encounter (HOSPITAL_COMMUNITY): Payer: Self-pay

## 2022-04-05 ENCOUNTER — Encounter: Payer: Self-pay | Admitting: Urology

## 2022-04-05 VITALS — BP 86/60 | HR 76 | Wt 200.2 lb

## 2022-04-05 DIAGNOSIS — I447 Left bundle-branch block, unspecified: Secondary | ICD-10-CM | POA: Insufficient documentation

## 2022-04-05 DIAGNOSIS — Z8546 Personal history of malignant neoplasm of prostate: Secondary | ICD-10-CM | POA: Diagnosis not present

## 2022-04-05 DIAGNOSIS — R0683 Snoring: Secondary | ICD-10-CM | POA: Diagnosis not present

## 2022-04-05 DIAGNOSIS — Z7984 Long term (current) use of oral hypoglycemic drugs: Secondary | ICD-10-CM | POA: Insufficient documentation

## 2022-04-05 DIAGNOSIS — I5022 Chronic systolic (congestive) heart failure: Secondary | ICD-10-CM | POA: Diagnosis present

## 2022-04-05 DIAGNOSIS — I493 Ventricular premature depolarization: Secondary | ICD-10-CM | POA: Diagnosis not present

## 2022-04-05 DIAGNOSIS — I428 Other cardiomyopathies: Secondary | ICD-10-CM | POA: Insufficient documentation

## 2022-04-05 DIAGNOSIS — Z87891 Personal history of nicotine dependence: Secondary | ICD-10-CM | POA: Insufficient documentation

## 2022-04-05 DIAGNOSIS — Z79899 Other long term (current) drug therapy: Secondary | ICD-10-CM | POA: Diagnosis not present

## 2022-04-05 DIAGNOSIS — R7989 Other specified abnormal findings of blood chemistry: Secondary | ICD-10-CM | POA: Diagnosis not present

## 2022-04-05 DIAGNOSIS — J439 Emphysema, unspecified: Secondary | ICD-10-CM | POA: Diagnosis not present

## 2022-04-05 DIAGNOSIS — K219 Gastro-esophageal reflux disease without esophagitis: Secondary | ICD-10-CM | POA: Diagnosis not present

## 2022-04-05 DIAGNOSIS — I340 Nonrheumatic mitral (valve) insufficiency: Secondary | ICD-10-CM | POA: Insufficient documentation

## 2022-04-05 DIAGNOSIS — J449 Chronic obstructive pulmonary disease, unspecified: Secondary | ICD-10-CM

## 2022-04-05 DIAGNOSIS — R7303 Prediabetes: Secondary | ICD-10-CM | POA: Insufficient documentation

## 2022-04-05 DIAGNOSIS — I2510 Atherosclerotic heart disease of native coronary artery without angina pectoris: Secondary | ICD-10-CM | POA: Diagnosis not present

## 2022-04-05 MED ORDER — AMIODARONE HCL 200 MG PO TABS: 200.0000 mg | ORAL_TABLET | Freq: Every day | ORAL | 3 refills | Status: DC

## 2022-04-05 MED ORDER — ENTRESTO 24-26 MG PO TABS
1.0000 | ORAL_TABLET | Freq: Two times a day (BID) | ORAL | 3 refills | Status: DC
Start: 2022-04-05 — End: 2023-02-24

## 2022-04-05 MED ORDER — ATORVASTATIN CALCIUM 40 MG PO TABS: 40.0000 mg | ORAL_TABLET | Freq: Every day | ORAL | 3 refills | Status: DC

## 2022-04-05 MED ORDER — FUROSEMIDE 20 MG PO TABS: 60.0000 mg | ORAL_TABLET | Freq: Every day | ORAL | 3 refills | Status: DC

## 2022-04-05 MED ORDER — SPIRONOLACTONE 25 MG PO TABS: 25.0000 mg | ORAL_TABLET | Freq: Every day | ORAL | 3 refills | Status: DC

## 2022-04-05 MED ORDER — CARVEDILOL 3.125 MG PO TABS: 3.1250 mg | ORAL_TABLET | Freq: Two times a day (BID) | ORAL | 3 refills | Status: DC

## 2022-04-05 MED ORDER — DIGOXIN 125 MCG PO TABS: 0.0625 mg | ORAL_TABLET | Freq: Every day | ORAL | 3 refills | Status: DC

## 2022-04-05 NOTE — Progress Notes (Signed)
Advanced Heart Failure Clinic Note   Primary Care: [facility] EP: Dr. Graciela Husbands HF Cardiologist: Dr. 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  He was seen by Dr Allyson Sabal 04/2021 for preop eval before radioactive seed implant and circumcision. He had LBBB of unknown duration, BP well-controlled. No further workup pre-procedure.  Admitted 11/22 for a/c systolic HF. Echo EF 20-25%. LV dilated (6.5cm) with severe central MR. Global HK with ? AK of anteroseptum.  Diuresed with IV lasix. Initiated on GDMT with digoxin, spironolactone, Entresto and Farxiga. Frequent PVCs, started on amio w/ reduction in PVC burden. R/LHC showed minimal nonobstructive CAD, cMRI showed LVEF 14%, diffuse HK, mild/mod RV dilation w/ severe systolic dysfunction, RVEF 15%, small nonspecific basal inferoseptal RV insertion site LGE, mild elevation in ECV, not suggestive of cardiac amyloidosis. 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 LifeVest placed  Home sleep study 12/22 AHI 0.6/hr  Echo 01/11/22: EF 25-30% LV markedly dilated. Severe dyssynchrony. RV normal. Mild to moderate MR. Referred to EP for CRT-D.  Today he returns for HF follow up. Overall feeling fine, continues to drive forklift. Some SOB with walking up inclines or 2 flights of steps. Denies palpitations, CP, dizziness, edema, or PND/Orthopnea. Appetite ok. No fever or chills. Weight at home 194-200 pounds. Taking all medications. Planning on CRT-D next week.  Cardiac Studies: - Echo (3/23): EF 25-30%, LV markedly dilated, severe dyssynchrony, RV ok, mild to moderate MR.  - Cardiac MRI (11/22):  LVEF 14%, diffuse HK, mild/mod RV dilation w/ severe systolic dysfunction, RVEF 15%, small nonspecific basal inferoseptal RV insertion site LGE, mild elevation in ECV, not suggestive of cardiac amyloidosis.   - Echo (11/22): EF  20-25%. LV dilated (6.5cm) with severe central MR. Global HK with ? AK of anteroseptum   - RHC/LHC with minimal obstructive CAD, low filling pressure, normal output and severe NICM   ROS: All systems reviewed and negative except as per HPI.   Past Medical History:  Diagnosis Date   Arthritis    CHF (congestive heart failure) (HCC)    Emphysema of lung (HCC)    GERD (gastroesophageal reflux disease)    Hypertension    Hypospadias, balanic    chronic   LBBB (left bundle branch block)    newly dx by ekg done 04-26-2021 for surgery on 06-05-2021,  pt stated he not had a previous ekg before, so pt sent for cardiac clearance prior to surgery;  cardiologist , dr berry, office note clearance in epic 05-09-2021 gave pt clearance with no further work-up needed   Lower urinary tract symptoms (LUTS)    Prostate cancer [facility])    urologist-- dr Annabell Howells--- dx by bx 01-08-2021, Gleason 3+4, T2a   Substance abuse (HCC)     Current Outpatient Medications  Medication Sig Dispense Refill   amiodarone (PACERONE) 200 MG tablet Take 1 tablet (200 mg total) by mouth daily. 90 tablet 3   atorvastatin (LIPITOR) 40 MG tablet Take 1 tablet (40 mg total) by mouth daily. 40 tablet 6   carvedilol (COREG) 3.125 MG tablet TAKE 1 TABLET BY MOUTH TWICE DAILY WITH A MEAL 180 tablet 3   dapagliflozin propanediol (FARXIGA) 10 MG TABS tablet Take 1 tablet (10 mg total) by mouth daily. 90 tablet 1   digoxin (LANOXIN) 0.125 MG tablet Take 0.5 tablets (0.0625 mg total) by mouth daily. 30  tablet 6   DULoxetine (CYMBALTA) 60 MG capsule Take 60 mg by mouth daily.     furosemide (LASIX) 40 MG tablet Take 1 tablet (40 mg total) by mouth daily. 30 tablet 6   magnesium oxide (MAG-OX) 400 MG tablet Take 1/2 tablet (200 mg total) by mouth daily. 30 tablet 6   mirabegron ER (MYRBETRIQ) 25 MG TB24 tablet Take 1 tablet (25 mg total) by mouth daily. 28 tablet 0   Multiple Vitamin (MULTIVITAMIN ADULT PO) Take by mouth.     omeprazole  (PRILOSEC) 40 MG capsule Take 40 mg by mouth daily.     potassium chloride SA (KLOR-CON M) 20 MEQ tablet Take 1 tablet (20 mEq total) by mouth daily. 30 tablet 6   sacubitril-valsartan (ENTRESTO) 49-51 MG Take 1 tablet by mouth 2 (two) times daily. 60 tablet 6   silodosin (RAPAFLO) 8 MG CAPS capsule Take 1 capsule (8 mg total) by mouth daily with breakfast. 30 capsule 11   spironolactone (ALDACTONE) 25 MG tablet Take 1 tablet (25 mg total) by mouth daily. 30 tablet 6   traMADol (ULTRAM) 50 MG tablet Take 50 mg by mouth 2 (two) times daily as needed.     triamcinolone (KENALOG) 0.025 % ointment Apply topically daily as needed (itching, rash).     valACYclovir (VALTREX) 500 MG tablet Take 500 mg by mouth 2 (two) times daily as needed.     cyclobenzaprine (FLEXERIL) 5 MG tablet Take 10 mg by mouth 3 (three) times daily as needed for muscle spasms. (Patient not taking: Reported on 04/05/2022)     No current facility-administered medications for this encounter.   No Known Allergies  Social History   Socioeconomic History   Marital status: Single    Spouse name: Not on file   Number of children: 1   Years of education: Not on file   Highest education level: Not on file  Occupational History   Occupation: [employer]    Comment: Estate agent  Tobacco Use   Smoking status: Former    Packs/day: 1.50    Years: 30.00    Total pack years: 45.00    Types: Cigarettes    Quit date: 2009    Years since quitting: 14.4   Smokeless tobacco: Never  Vaping Use   Vaping Use: Never used  Substance and Sexual Activity   Alcohol use: Not Currently   Drug use: Not Currently    Types: Marijuana, "Crack" cocaine   Sexual activity: Yes  Other Topics Concern   Not on file  Social History Narrative   Not on file   Social Determinants of Health   Financial Resource Strain: Low Risk  (07/27/2021)   Overall Financial Resource Strain (CARDIA)    Difficulty of Paying Living Expenses: Not  hard at all  Food Insecurity: No Food Insecurity (07/27/2021)   Hunger Vital Sign    Worried About Running Out of Food in the Last Year: Never true    Ran Out of Food in the Last Year: Never true  Transportation Needs: No Transportation Needs (07/27/2021)   PRAPARE - Administrator, Civil Service (Medical): No    Lack of Transportation (Non-Medical): No  Physical Activity: Inactive (07/27/2021)   Exercise Vital Sign    Days of Exercise per Week: 0 days    Minutes of Exercise per Session: 0 min  Stress: No Stress Concern Present (07/27/2021)   [employer]en Duffy [employer] - Occupational Stress Questionnaire  Feeling of Stress : Not at all  Social Connections: Socially Isolated (07/27/2021)   Social Connection and Isolation Panel [NHANES]    Frequency of Communication with Friends and Family: Three times a week    Frequency of Social Gatherings with Friends and Family: Three times a week    Attends Religious Services: Never    Active Member of Clubs or Organizations: No    Attends Banker Meetings: Never    Marital Status: Never married  Intimate Partner Violence: Not At Risk (07/27/2021)   Humiliation, Afraid, Rape, and Kick questionnaire    Fear of Current or Ex-Partner: No    Emotionally Abused: No    Physically Abused: No    Sexually Abused: No    Family History  Problem Relation Age of Onset   Stroke Father    Lung cancer Maternal Aunt    Diabetes Maternal Uncle    Brain cancer Maternal Uncle    Lung cancer Maternal Uncle    Lung cancer Maternal Uncle    Cancer Paternal Grandmother    Diabetes Paternal Grandfather    Colon cancer Neg Hx    Esophageal cancer Neg Hx    Stomach cancer Neg Hx    Pancreatic cancer Neg Hx    Colon polyps Neg Hx    Rectal cancer Neg Hx    BP (!) 86/60   Pulse 76   Wt 90.8 kg (200 lb 3.2 oz)   SpO2 95%   BMI 30.44 kg/m   Wt Readings from Last 3 Encounters:  04/05/22 90.8 kg (200 lb 3.2 oz)   03/07/22 89.4 kg (197 lb)  03/07/22 89.4 kg (197 lb)   PHYSICAL EXAM: General:  NAD. No resp difficulty HEENT: Normal Neck: Supple. No JVD. Carotids 2+ bilat; no bruits. No lymphadenopathy or thryomegaly appreciated. Cor: PMI nondisplaced. Regular rate & rhythm. No rubs, gallops or murmurs. Lungs: Clear Abdomen: Soft, nontender, nondistended. No hepatosplenomegaly. No bruits or masses. Good bowel sounds. Extremities: No cyanosis, clubbing, rash, edema Neuro: Alert & oriented x 3, cranial nerves grossly intact. Moves all 4 extremities w/o difficulty. Affect pleasant.  REDs: 36%  ASSESSMENT & PLAN:  1. Chronic systolic HF - Echo 11/22 EF 20-25%. LV dilated (6.5cm) with severe central MR. Global HK with ? AK of anteroseptum  - RHC/LHC 11/22 with minimal obstructive CAD, low filling pressure, normal output and severe NICM - cMRI LVEF 14%, diffuse HK, mild/mod RV dilation w/ severe systolic dysfunction, RVEF 15%, small nonspecific basal inferoseptal RV insertion site LGE, mild elevation in ECV, not suggestive of cardiac amyloidosis. - Echo (3/23): EF 25-30% LV markedly dilated. Severe dyssynchrony. RV normal. Mild to moderate MR  - Suspect LBBB CMP (+/- PVC CM)  - Improved NYHA II today, volume looks ok on exam, REDs 36%. - BP low on my  re-check (82/60), decrease Entresto to 24/26 mg bid. - Increase Lasix to 60 mg daily + extra 20 KCL. - Continue digoxin 0.0625 mg daily. - Continue spiro 25 mg daily.   - Continue Farxiga 10 mg daily.  - CRT-D next week. - Recent labs ok (SCr 1, K 4.5), repeat BMET in 2 weeks in [place] at [facility]   2. Frequent PVCs - Continue amiodarone 200 mg daily (recently reduced due to elevated LFTs).  - keep K> 4.0 Mg > 2.0 - No longer wearing LifeVest. - Sleep study normal   3. Severe mitral regurgitation - Functional - Mild to moderate MR on echo 3/23. - Should improve with  treatment of HF and possibly CRT   4. COPD - Quit smoking 2009.   5.  Pre-Diabetes - A1C 6.0 - On Farxiga.   6. H/o prostate CA - s/p radioactive seeds   7. Snoring - Home sleep study.12/22 AHI 0.6/hr  Follow up in 3-4 months with Dr. Gala Romney.  Ruben Ganong, FNP  11:18 AM

## 2022-04-08 ENCOUNTER — Ambulatory Visit (HOSPITAL_COMMUNITY): Payer: 59

## 2022-04-08 ENCOUNTER — Ambulatory Visit (HOSPITAL_COMMUNITY)
Admission: RE | Disposition: A | Discharge status: Home / Self Care | Payer: 59 | Source: Home / Self Care | Attending: Internal Medicine

## 2022-04-08 ENCOUNTER — Ambulatory Visit (HOSPITAL_COMMUNITY)
Admission: RE | Admit: 2022-04-08 | Discharge: 2022-04-08 | Disposition: A | Discharge status: Home / Self Care | Payer: 59 | Attending: Internal Medicine | Admitting: Internal Medicine

## 2022-04-08 ENCOUNTER — Other Ambulatory Visit: Payer: Self-pay

## 2022-04-08 DIAGNOSIS — I110 Hypertensive heart disease with heart failure: Secondary | ICD-10-CM | POA: Diagnosis not present

## 2022-04-08 DIAGNOSIS — Z95810 Presence of automatic (implantable) cardiac defibrillator: Secondary | ICD-10-CM

## 2022-04-08 DIAGNOSIS — I5022 Chronic systolic (congestive) heart failure: Secondary | ICD-10-CM | POA: Insufficient documentation

## 2022-04-08 DIAGNOSIS — I340 Nonrheumatic mitral (valve) insufficiency: Secondary | ICD-10-CM | POA: Insufficient documentation

## 2022-04-08 DIAGNOSIS — I428 Other cardiomyopathies: Secondary | ICD-10-CM | POA: Diagnosis present

## 2022-04-08 DIAGNOSIS — Z87891 Personal history of nicotine dependence: Secondary | ICD-10-CM | POA: Insufficient documentation

## 2022-04-08 DIAGNOSIS — I447 Left bundle-branch block, unspecified: Secondary | ICD-10-CM | POA: Insufficient documentation

## 2022-04-08 HISTORY — DX: Presence of automatic (implantable) cardiac defibrillator: Z95.810

## 2022-04-08 HISTORY — PX: BIV ICD INSERTION CRT-D: EP1195

## 2022-04-08 SURGERY — BIV ICD INSERTION CRT-D
Anesthesia: LOCAL

## 2022-04-08 MED ORDER — SODIUM CHLORIDE 0.9 % IV SOLN: INTRAVENOUS | Status: DC

## 2022-04-08 MED ORDER — MIDAZOLAM HCL 5 MG/5ML IJ SOLN
INTRAMUSCULAR | Status: DC | PRN
  Administered 2022-04-08: 3 mg via INTRAVENOUS
  Administered 2022-04-08 (×3): 1 mg via INTRAVENOUS

## 2022-04-08 MED ORDER — CEFAZOLIN SODIUM-DEXTROSE 2-4 GM/100ML-% IV SOLN
INTRAVENOUS | Status: AC
Start: 2022-04-08 — End: ?
  Filled 2022-04-08: qty 100

## 2022-04-08 MED ORDER — HEPARIN (PORCINE) IN NACL 1000-0.9 UT/500ML-% IV SOLN
INTRAVENOUS | Status: DC | PRN
  Administered 2022-04-08: 500 mL

## 2022-04-08 MED ORDER — MIDAZOLAM HCL 5 MG/5ML IJ SOLN
INTRAMUSCULAR | Status: AC
Start: 2022-04-08 — End: ?
  Filled 2022-04-08: qty 5

## 2022-04-08 MED ORDER — ACETAMINOPHEN 325 MG PO TABS
325.0000 mg | ORAL_TABLET | ORAL | Status: DC | PRN
  Administered 2022-04-08: 650 mg via ORAL
  Filled 2022-04-08: qty 2

## 2022-04-08 MED ORDER — FENTANYL CITRATE (PF) 100 MCG/2ML IJ SOLN
INTRAMUSCULAR | Status: AC
  Filled 2022-04-08: qty 2

## 2022-04-08 MED ORDER — SODIUM CHLORIDE 0.9 % IV SOLN
80.0000 mg | INTRAVENOUS | Status: AC
  Administered 2022-04-08: 80 mg

## 2022-04-08 MED ORDER — IODIXANOL 320 MG/ML IV SOLN
INTRAVENOUS | Status: DC | PRN
  Administered 2022-04-08: 12 mL

## 2022-04-08 MED ORDER — MIDAZOLAM HCL 5 MG/5ML IJ SOLN
INTRAMUSCULAR | Status: AC
  Filled 2022-04-08: qty 5

## 2022-04-08 MED ORDER — HEPARIN (PORCINE) IN NACL 1000-0.9 UT/500ML-% IV SOLN
INTRAVENOUS | Status: AC
  Filled 2022-04-08: qty 500

## 2022-04-08 MED ORDER — LIDOCAINE HCL (PF) 1 % IJ SOLN
INTRAMUSCULAR | Status: AC
  Filled 2022-04-08: qty 30

## 2022-04-08 MED ORDER — CEFAZOLIN SODIUM-DEXTROSE 2-4 GM/100ML-% IV SOLN
2.0000 g | INTRAVENOUS | Status: AC
  Administered 2022-04-08: 2 g via INTRAVENOUS

## 2022-04-08 MED ORDER — LIDOCAINE HCL (PF) 1 % IJ SOLN
INTRAMUSCULAR | Status: DC | PRN
  Administered 2022-04-08: 60 mg

## 2022-04-08 MED ORDER — ONDANSETRON HCL 4 MG/2ML IJ SOLN: 4.0000 mg | Freq: Four times a day (QID) | INTRAMUSCULAR | Status: DC | PRN

## 2022-04-08 MED ORDER — FENTANYL CITRATE (PF) 100 MCG/2ML IJ SOLN
INTRAMUSCULAR | Status: DC | PRN
  Administered 2022-04-08: 25 ug via INTRAVENOUS
  Administered 2022-04-08: 50 ug via INTRAVENOUS
  Administered 2022-04-08 (×2): 25 ug via INTRAVENOUS

## 2022-04-08 MED ORDER — CHLORHEXIDINE GLUCONATE 4 % EX LIQD: 4.0000 | Freq: Once | CUTANEOUS | Status: DC

## 2022-04-08 MED ORDER — SODIUM CHLORIDE 0.9 % IV SOLN
INTRAVENOUS | Status: AC
  Filled 2022-04-08: qty 2

## 2022-04-08 SURGICAL SUPPLY — 17 items
ASSURA CRTD CD3369-40C (ICD Generator) ×2 IMPLANT
CABLE SURGICAL S-101-97-12 (CABLE) ×2 IMPLANT
CATH CPS DIRECT 135 DS2C020 (CATHETERS) ×1 IMPLANT
DEFIB ASSURA CRT-D (ICD Generator) IMPLANT
KIT ESSENTIALS PG (KITS) ×2 IMPLANT
LEAD DURATA 7122-65CM (Lead) ×1 IMPLANT
LEAD QUARTET 1458Q-86CM (Lead) ×1 IMPLANT
LEAD TENDRIL MRI 52CM LPA1200M (Lead) ×1 IMPLANT
PAD DEFIB RADIO PHYSIO CONN (PAD) ×2 IMPLANT
SHEATH 7FR PRELUDE SNAP 13 (SHEATH) ×1 IMPLANT
SHEATH 8FR PRELUDE SNAP 13 (SHEATH) ×1 IMPLANT
SHEATH 8FR PRELUDE SNAP 25 (SHEATH) ×1 IMPLANT
SHEATH 9.5FR PRELUDE SNAP 13 (SHEATH) ×1 IMPLANT
SLITTER AGILIS HISPRO (INSTRUMENTS) ×1 IMPLANT
TRAY PACEMAKER INSERTION (PACKS) ×2 IMPLANT
WIRE ACUITY WHISPER EDS 4648 (WIRE) ×2 IMPLANT
WIRE HI TORQ VERSACORE-J 145CM (WIRE) ×2 IMPLANT

## 2022-04-08 NOTE — H&P (Signed)
Patient Care Team: [facility] as PCP - General Allyson Sabal Delton See, MD as PCP - Cardiology (Cardiology) Margaretmary Dys, MD as Consulting Physician (Radiation Oncology) Bjorn Pippin, MD as Attending Physician (Urology) Raymondo Band as Physician Assistant (Hematology and Oncology) Maryclare Labrador, RN as Registered Nurse   HPI  Ruben Duffy is a 56 y.o. male admitted for CRT D for primary prevention and CHF in setting of LBBB, Freq PVCs MR severe>> mod/mild and NICM  Patient denies symptoms of respiratory, GI intolerance, sun sensitivity, neurological symptoms attributable to amiodarone.     The patient denies chest pain, shortness of breath, nocturnal dyspnea, orthopnea or peripheral edema.  There have been no palpitations, lightheadedness or syncope.    DATE TEST EF    11/22 Echo  20-25% MR severe central  11/22 LHC   Non obstructive CAD  11/22 cMRI 14% Biventricular failure    3/23 Echo  25-30% MR mild- mod    Date Cr K Hgb Dig  11/22     13.7    3/23 1.02 4.9    0.4                Records and Results Reviewed   Past Medical History:  Diagnosis Date   Arthritis    CHF (congestive heart failure) (HCC)    Emphysema of lung (HCC)    GERD (gastroesophageal reflux disease)    Hypertension    Hypospadias, balanic    chronic   LBBB (left bundle branch block)    newly dx by ekg done 04-26-2021 for surgery on 06-05-2021,  pt stated he not had a previous ekg before, so pt sent for cardiac clearance prior to surgery;  cardiologist , dr berry, office note clearance in epic 05-09-2021 gave pt clearance with no further work-up needed   Lower urinary tract symptoms (LUTS)    Prostate cancer [facility])    urologist-- dr Annabell Howells--- dx by bx 01-08-2021, Gleason 3+4, T2a   Substance abuse (HCC)     Past Surgical History:  Procedure Laterality Date   BIOPSY PROSTATE     CIRCUMCISION N/A 06/05/2021   Procedure: CIRCUMCISION ADULT;  Surgeon: Bjorn Pippin, MD;   Location[facility];  Service: Urology;  Laterality: N/A;   HEMORRHOID SURGERY     HYPOSPADIAS CORRECTION     RADIOACTIVE SEED IMPLANT N/A 06/05/2021   Procedure: RADIOACTIVE SEED IMPLANT/BRACHYTHERAPY IMPLANT;  Surgeon: Bjorn Pippin, MD;  Location: [facility];  Service: Urology;  Laterality: N/A;   RIGHT/LEFT HEART CATH AND CORONARY ANGIOGRAPHY N/A 09/10/2021   Procedure: RIGHT/LEFT HEART CATH AND CORONARY ANGIOGRAPHY;  Surgeon: Dolores Patty, MD;  Location: [facility];  Service: Cardiovascular;  Laterality: N/A;   SPACE OAR INSTILLATION N/A 06/05/2021   Procedure: SPACE OAR INSTILLATION;  Surgeon: Bjorn Pippin, MD;  Location: [facility];  Service: Urology;  Laterality: N/A;    Current Facility-Administered Medications  Medication Dose Route Frequency Provider Last Rate Last Admin   0.9 %  sodium chloride infusion   Intravenous Continuous Duke Salvia, MD 50 mL/hr at 04/08/22 0806 New Bag at 04/08/22 0806   0.9 %  sodium chloride infusion   Intravenous Continuous Duke Salvia, MD 50 mL/hr at 04/08/22 0807 New Bag at 04/08/22 0807   ceFAZolin (ANCEF) IVPB 2g/100 mL premix  2 g Intravenous On Call Duke Salvia, MD       chlorhexidine (HIBICLENS) 4 % liquid 4  Application  4 Application Topical Once Duke Salvia, MD       gentamicin (GARAMYCIN) 80 mg in sodium chloride 0.9 % 500 mL irrigation  80 mg Irrigation On Call Duke Salvia, MD        No Known Allergies    Social History   Tobacco Use   Smoking status: Former    Packs/day: 1.50    Years: 30.00    Total pack years: 45.00    Types: Cigarettes    Quit date: 2009    Years since quitting: 14.4   Smokeless tobacco: Never  Vaping Use   Vaping Use: Never used  Substance Use Topics   Alcohol use: Not Currently   Drug use: Not Currently    Types: Marijuana, "Crack" cocaine     Family History  Problem Relation Age of Onset   Stroke Father    Lung cancer  Maternal Aunt    Diabetes Maternal Uncle    Brain cancer Maternal Uncle    Lung cancer Maternal Uncle    Lung cancer Maternal Uncle    Cancer Paternal Grandmother    Diabetes Paternal Grandfather    Colon cancer Neg Hx    Esophageal cancer Neg Hx    Stomach cancer Neg Hx    Pancreatic cancer Neg Hx    Colon polyps Neg Hx    Rectal cancer Neg Hx      Current Meds  Medication Sig   amiodarone (PACERONE) 200 MG tablet Take 1 tablet (200 mg total) by mouth daily.   atorvastatin (LIPITOR) 40 MG tablet Take 1 tablet (40 mg total) by mouth daily.   carvedilol (COREG) 3.125 MG tablet Take 1 tablet (3.125 mg total) by mouth 2 (two) times daily with a meal.   dapagliflozin propanediol (FARXIGA) 10 MG TABS tablet Take 1 tablet (10 mg total) by mouth daily.   digoxin (LANOXIN) 0.125 MG tablet Take 0.5 tablets (0.0625 mg total) by mouth daily.   DULoxetine (CYMBALTA) 60 MG capsule Take 60 mg by mouth daily.   furosemide (LASIX) 20 MG tablet Take 3 tablets (60 mg total) by mouth daily.   magnesium oxide (MAG-OX) 400 MG tablet Take 1/2 tablet (200 mg total) by mouth daily.   mirabegron ER (MYRBETRIQ) 25 MG TB24 tablet Take 1 tablet (25 mg total) by mouth daily.   Multiple Vitamin (MULTIVITAMIN ADULT PO) Take by mouth.   omeprazole (PRILOSEC) 40 MG capsule Take 40 mg by mouth daily.   potassium chloride SA (KLOR-CON M) 20 MEQ tablet Take 1 tablet (20 mEq total) by mouth daily.   sacubitril-valsartan (ENTRESTO) 24-26 MG Take 1 tablet by mouth 2 (two) times daily.   silodosin (RAPAFLO) 8 MG CAPS capsule Take 1 capsule (8 mg total) by mouth daily with breakfast.   spironolactone (ALDACTONE) 25 MG tablet Take 1 tablet (25 mg total) by mouth daily.   traMADol (ULTRAM) 50 MG tablet Take 50 mg by mouth 2 (two) times daily as needed.   triamcinolone (KENALOG) 0.025 % ointment Apply topically daily as needed (itching, rash).   [DISCONTINUED] amiodarone (PACERONE) 200 MG tablet Take 1 tablet (200 mg total)  by mouth daily.   [DISCONTINUED] atorvastatin (LIPITOR) 40 MG tablet Take 1 tablet (40 mg total) by mouth daily.   [DISCONTINUED] carvedilol (COREG) 3.125 MG tablet TAKE 1 TABLET BY MOUTH TWICE DAILY WITH A MEAL   [DISCONTINUED] digoxin (LANOXIN) 0.125 MG tablet Take 0.5 tablets (0.0625 mg total) by mouth daily.   [DISCONTINUED] furosemide (LASIX)  40 MG tablet Take 1 tablet (40 mg total) by mouth daily.   [DISCONTINUED] sacubitril-valsartan (ENTRESTO) 49-51 MG Take 1 tablet by mouth 2 (two) times daily.   [DISCONTINUED] spironolactone (ALDACTONE) 25 MG tablet Take 1 tablet (25 mg total) by mouth daily.     Review of Systems negative except from HPI and PMH  Physical Exam BP 103/65   Pulse 62   Temp 98 F (36.7 C) (Oral)   Resp 18   Ht 5\' 8"  (1.727 m)   Wt 89.8 kg   SpO2 94%   BMI 30.11 kg/m  Well developed and well nourished in no acute distress HENT normal E scleral and icterus clear Neck Supple JVP flat; carotids brisk and full Clear to ausculation Regular rate and rhythm, no murmurs gallops or rub Soft with active bowel sounds No clubbing cyanosis  Edema Alert and oriented, grossly normal motor and sensory function Skin Warm and Dry    Assessment and  Plan   Nonischemic cardiomyopathy with biventricular involvement   Left bundle branch block fractionation   Congestive heart failure-systolic-class 2b   MR mild-moderate   PVCs  For CRT implant today Have reviewed the potential benefits and risks of ICD implantation including but not limited to death, perforation of heart or lung, lead dislodgement, infection,  device malfunction and inappropriate shocks.  The patient expresses understanding  and is willing to proceed.

## 2022-04-09 ENCOUNTER — Telehealth: Payer: Self-pay

## 2022-04-09 MED FILL — Lidocaine HCl Local Preservative Free (PF) Inj 1%: INTRAMUSCULAR | Qty: 30 | Status: AC

## 2022-04-11 ENCOUNTER — Ambulatory Visit: Payer: 59 | Admitting: Urology

## 2022-04-11 ENCOUNTER — Encounter: Payer: Self-pay | Admitting: Orthopaedic Surgery

## 2022-04-11 ENCOUNTER — Ambulatory Visit (INDEPENDENT_AMBULATORY_CARE_PROVIDER_SITE_OTHER): Payer: 59 | Admitting: Orthopaedic Surgery

## 2022-04-11 VITALS — Ht 68.0 in | Wt 198.0 lb

## 2022-04-11 DIAGNOSIS — M545 Low back pain, unspecified: Secondary | ICD-10-CM | POA: Diagnosis not present

## 2022-04-11 DIAGNOSIS — G8929 Other chronic pain: Secondary | ICD-10-CM | POA: Diagnosis not present

## 2022-04-11 NOTE — Progress Notes (Signed)
Office Visit Note   Patient: Ruben Duffy           Date of Birth: 10-Mar-1966           MRN: [id number] Visit Date: 04/11/2022              Requested by: Practice, [place],  [place] [address] PCP: [facility]   Assessment & Plan: Visit Diagnoses: No diagnosis found.  Plan: We will set patient up for single epidural either L5-S1 midline are left L5-S1.  Reviewed the scan he has with facet arthropathy changes multiple levels and symptoms are most consistent with L5-S1 where he has some foraminal protrusion and spondylolisthesis degenerative in nature.  Follow-Up Instructions: No follow-ups on file.   Orders:  No orders of the defined types were placed in this encounter.  No orders of the defined types were placed in this encounter.     Procedures: No procedures performed   Clinical Data: No additional findings.   Subjective: Chief Complaint  Patient presents with   Lower Back - Follow-up    MRI Results    HPI 56 year old male returns with ongoing problems with back pain left leg pain.  MRI scan 03/08/2022 is reviewed.  He has multilevel degenerative changes facet arthropathy L2-S1.  No high-grade central stenosis.   subarticular lateral recess narrowing is present with a left foraminal protrusion L5-S1 patient had biventricular pacemaker inserted 04/08/2022.  Past history of heart failure left bundle branch block.  Patient's also had stage IIb prostate cancer.  Review of Systems all other systems noncontributory to HPI.   Objective: Vital Signs: Ht '5\' 8"'$  (1.727 m)   Wt 198 lb (89.8 kg)   BMI 30.11 kg/m   Physical Exam Constitutional:      Appearance: He is well-developed.  HENT:     Head: Normocephalic and atraumatic.     Right Ear: External ear normal.     Left Ear: External ear normal.  Eyes:     Pupils: Pupils are equal, round, and reactive to light.  Neck:     Thyroid: No thyromegaly.     Trachea: No tracheal  deviation.  Cardiovascular:     Rate and Rhythm: Normal rate.  Pulmonary:     Effort: Pulmonary effort is normal.     Breath sounds: No wheezing.  Abdominal:     General: Bowel sounds are normal.     Palpations: Abdomen is soft.  Musculoskeletal:     Cervical back: Neck supple.  Skin:    General: Skin is warm and dry.     Capillary Refill: Capillary refill takes less than 2 seconds.  Neurological:     Mental Status: He is alert and oriented to person, place, and time.  Psychiatric:        Behavior: Behavior normal.        Thought Content: Thought content normal.        Judgment: Judgment normal.   New patient maker chest wall noted.  Ortho Exam patient has some pain with straight leg raising on the left pain with popliteal compression test.  Gastrocsoleus is intact.  Good hip range of motion right and left.  Specialty Comments:  No specialty comments available.  Imaging: CLINICAL DATA:  Low back pain, symptoms persist with > 6 wks treatment low back pain, L3-4 DDD, lateral recess stenosis, foraminal stenosis   EXAM: MRI LUMBAR SPINE WITHOUT CONTRAST   TECHNIQUE: Multiplanar, multisequence MR imaging of the lumbar  spine was performed. No intravenous contrast was administered.   COMPARISON:  None Available.   FINDINGS: Segmentation:  Standard.   Alignment: Trace retrolisthesis at L1-L2. Trace anterolisthesis at L5-S1.   Vertebrae: Chronic anterior wedging of L1. Degenerative endplate irregularity at T12-L1: L1-L2, L3-L4, and L4-L5. Mild degenerative endplate marrow edema. Vertebral body hemangioma at L4.   Conus medullaris and cauda equina: Conus extends to the L1 level. Conus and cauda equina appear normal.   Paraspinal and other soft tissues: Unremarkable.   Disc levels:   T12-L1: Minimal disc bulge.  No canal or foraminal stenosis.   L1-L2: Disc bulge with superimposed left foraminal protrusion and endplate osteophytic ridging. Minor facet arthropathy.  No canal stenosis. No right foraminal stenosis. Mild to moderate left foraminal stenosis.   L2-L3: Disc bulge slightly eccentric to the right. Moderate facet arthropathy. No canal stenosis. Minor effacement of the right subarticular recess. No foraminal stenosis.   L3-L4: Disc bulge with superimposed right foraminal/far lateral protrusion and endplate osteophytic ridging. Moderate facet arthropathy with ligamentum flavum infolding. Mild canal stenosis. Partial effacement of the right subarticular recess. Moderate right and minor left foraminal stenosis.   L4-L5:  Minor facet arthropathy.  No canal or foraminal stenosis.   L5-S1: Anterolisthesis with uncovering of disc bulge with superimposed left foraminal protrusion. Moderate to marked facet arthropathy with ligamentum flavum infolding. No canal stenosis. Minor right and marked left foraminal stenosis.   IMPRESSION: Multilevel degenerative changes as detailed above. Facet arthropathy is greatest at L2-L3, L3-L4, and L5-S1. No high-grade canal stenosis. Foraminal narrowing is greatest on the right at L3-L4 and on the left at L5-S1. Right subarticular recess narrowing is present at L3-L4.     Electronically Signed   By: Macy Mis M.D.   On: 03/08/2022 12:31   PMFS History: Patient Active Problem List   Diagnosis Date Noted   Primary osteoarthritis, right shoulder 81/10/7508   Chronic systolic heart failure ([facility]) 09/19/2021   New onset of congestive heart failure ([facility]) 09/07/2021   Acute CHF (congestive heart failure) ([facility]) 09/07/2021   Left bundle branch block 05/09/2021   Essential hypertension 05/09/2021   Preoperative clearance 05/09/2021   Malignant neoplasm of prostate ([facility]) 02/23/2021   Past Medical History:  Diagnosis Date   Arthritis    CHF (congestive heart failure) (HCC)    Emphysema of lung (HCC)    GERD (gastroesophageal reflux disease)    Hypertension    Hypospadias, balanic    chronic   LBBB  (left bundle branch block)    newly dx by ekg done 04-26-2021 for surgery on 06-05-2021,  pt stated he not had a previous ekg before, so pt sent for cardiac clearance prior to surgery;  cardiologist , dr berry, office note clearance in epic 05-09-2021 gave pt clearance with no further work-up needed   Lower urinary tract symptoms (LUTS)    Prostate cancer [facility])    urologist-- dr Jeffie Pollock--- dx by bx 01-08-2021, Gleason 3+4, T2a   Substance abuse ([facility])     Family History  Problem Relation Age of Onset   Stroke Father    Lung cancer Maternal Aunt    Diabetes Maternal Uncle    Brain cancer Maternal Uncle    Lung cancer Maternal Uncle    Lung cancer Maternal Uncle    Cancer Paternal Grandmother    Diabetes Paternal Grandfather    Colon cancer Neg Hx    Esophageal cancer Neg Hx    Stomach cancer Neg Hx  Pancreatic cancer Neg Hx    Colon polyps Neg Hx    Rectal cancer Neg Hx     Past Surgical History:  Procedure Laterality Date   BIOPSY PROSTATE     CIRCUMCISION N/A 06/05/2021   Procedure: CIRCUMCISION ADULT;  Surgeon: Irine Seal, MD;  Location: [facility];  Service: Urology;  Laterality: N/A;   HEMORRHOID SURGERY     HYPOSPADIAS CORRECTION     RADIOACTIVE SEED IMPLANT N/A 06/05/2021   Procedure: RADIOACTIVE SEED IMPLANT/BRACHYTHERAPY IMPLANT;  Surgeon: Irine Seal, MD;  Location[facility];  Service: Urology;  Laterality: N/A;   RIGHT/LEFT HEART CATH AND CORONARY ANGIOGRAPHY N/A 09/10/2021   Procedure: RIGHT/LEFT HEART CATH AND CORONARY ANGIOGRAPHY;  Surgeon: Jolaine Artist, MD;  Location: [facility];  Service: Cardiovascular;  Laterality: N/A;   SPACE OAR INSTILLATION N/A 06/05/2021   Procedure: SPACE OAR INSTILLATION;  Surgeon: Irine Seal, MD;  Location[facility];  Service: Urology;  Laterality: N/A;   Social History   Occupational History   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  Tobacco Use    Smoking status: Former    Packs/day: 1.50    Years: 30.00    Total pack years: 45.00    Types: Cigarettes    Quit date: 2009    Years since quitting: 14.4   Smokeless tobacco: Never  Vaping Use   Vaping Use: Never used  Substance and Sexual Activity   Alcohol use: Not Currently   Drug use: Not Currently    Types: Marijuana, "Crack" cocaine   Sexual activity: Yes

## 2022-04-12 ENCOUNTER — Encounter (HOSPITAL_COMMUNITY): Payer: Self-pay | Admitting: Internal Medicine

## 2022-04-15 ENCOUNTER — Encounter: Payer: Self-pay | Admitting: Cardiology

## 2022-04-15 NOTE — Telephone Encounter (Signed)
Patient is asking about his CPAP titration apt on 04/19/22 can be changed to 04/18/22.

## 2022-04-17 ENCOUNTER — Other Ambulatory Visit (HOSPITAL_COMMUNITY)
Admission: RE | Admit: 2022-04-17 | Discharge: 2022-04-17 | Disposition: A | Discharge status: Home / Self Care | Payer: 59 | Source: Ambulatory Visit | Attending: Family Medicine | Admitting: Family Medicine

## 2022-04-17 DIAGNOSIS — I5022 Chronic systolic (congestive) heart failure: Secondary | ICD-10-CM | POA: Insufficient documentation

## 2022-04-17 LAB — BASIC METABOLIC PANEL
Anion gap: 6 (ref 5–15)
BUN: 14 mg/dL (ref 6–20)
CO2: 28 mmol/L (ref 22–32)
Calcium: 9.3 mg/dL (ref 8.9–10.3)
Chloride: 104 mmol/L (ref 98–111)
Creatinine, Ser: 1.33 mg/dL — ABNORMAL HIGH (ref 0.61–1.24)
GFR, Estimated: >60 mL/min (ref >60)
Glucose, Bld: 105 mg/dL — ABNORMAL HIGH (ref 70–99)
Potassium: 4.7 mmol/L (ref 3.5–5.1)
Sodium: 138 mmol/L (ref 135–145)

## 2022-04-17 LAB — BRAIN NATRIURETIC PEPTIDE: B Natriuretic Peptide: 133 pg/mL — ABNORMAL HIGH (ref 0.0–100.0)

## 2022-04-18 ENCOUNTER — Ambulatory Visit (INDEPENDENT_AMBULATORY_CARE_PROVIDER_SITE_OTHER): Payer: 59

## 2022-04-18 ENCOUNTER — Telehealth (HOSPITAL_COMMUNITY): Payer: Self-pay | Admitting: Pharmacy Technician

## 2022-04-18 ENCOUNTER — Other Ambulatory Visit (HOSPITAL_COMMUNITY): Payer: Self-pay

## 2022-04-18 DIAGNOSIS — I447 Left bundle-branch block, unspecified: Secondary | ICD-10-CM | POA: Diagnosis not present

## 2022-04-18 NOTE — Telephone Encounter (Signed)
Patient Advocate Encounter   Received notification from [facility] that prior authorization for Wilder Glade is required.   PA submitted on CoverMyMeds Key [id number] Status is pending   Will continue to follow.

## 2022-04-19 ENCOUNTER — Encounter: Payer: 59 | Admitting: Cardiology

## 2022-04-19 NOTE — Telephone Encounter (Signed)
Advanced Heart Failure Patient Advocate Encounter  Prior Authorization for Wilder Glade has been approved.    PA# [id number] Effective dates: 04/18/22 through 04/19/23  Charlann Boxer, CPhT

## 2022-04-21 LAB — CUP PACEART INCLINIC DEVICE CHECK
Battery Remaining Longevity: 54 mo
Brady Statistic RA Percent Paced: 0.16 %
Brady Statistic RV Percent Paced: 99.94 %
Date Time Interrogation Session: 20230706143000
HighPow Impedance: 59.625
Implantable Lead Implant Date: 20230626
Implantable Lead Implant Date: 20230626
Implantable Lead Implant Date: 20230626
Implantable Lead Location: 753858
Implantable Lead Location: 753859
Implantable Lead Location: 753860
Implantable Lead Model: 7122
Implantable Pulse Generator Implant Date: 20230626
Lead Channel Impedance Value: 562.5 Ohm
Lead Channel Impedance Value: 612.5 Ohm
Lead Channel Impedance Value: 687.5 Ohm
Lead Channel Pacing Threshold Amplitude: 0.5 V
Lead Channel Pacing Threshold Amplitude: 0.75 V
Lead Channel Pacing Threshold Amplitude: 1.5 V
Lead Channel Pacing Threshold Amplitude: 1.5 V
Lead Channel Pacing Threshold Pulse Width: 0.5 ms
Lead Channel Pacing Threshold Pulse Width: 0.5 ms
Lead Channel Pacing Threshold Pulse Width: 0.8 ms
Lead Channel Pacing Threshold Pulse Width: 0.8 ms
Lead Channel Sensing Intrinsic Amplitude: 11.6 mV
Lead Channel Sensing Intrinsic Amplitude: 2.3 mV
Lead Channel Setting Pacing Amplitude: 3.5 V
Lead Channel Setting Pacing Amplitude: 3.5 V
Lead Channel Setting Pacing Amplitude: 3.5 V
Lead Channel Setting Pacing Pulse Width: 0.5 ms
Lead Channel Setting Pacing Pulse Width: 0.8 ms
Lead Channel Setting Sensing Sensitivity: 0.5 mV
Pulse Gen Serial Number: 8949760

## 2022-04-21 NOTE — Progress Notes (Signed)
Wound check appointment s/p CRT-D implant 6/26. Steri-strips removed. Wound without redness or edema. Incision edges approximated, wound well healed. Normal device function. Thresholds, sensing, and impedances consistent with implant measurements. LV threshold increased to 2.0/0.5, pulse width increased to 0.8 for threshold of 1.5V. BiV pacing 100%. Device programmed at 3.5V for extra safety margin until 3 month visit. Histogram distribution appropriate for patient and level of activity. No mode switches or ventricular arrhythmias noted. Patient educated about wound care, arm mobility, lifting restrictions, shock plan. Patient enrolled in remote monitoring with next transmission scheduled 9/25. 91 day follow up with Dr. Caryl Comes 9/28.

## 2022-04-24 NOTE — Telephone Encounter (Signed)
Prior Authorization for TITRATION sent to [facility] via web portal.

## 2022-04-24 NOTE — Addendum Note (Signed)
Addended by: Freada Bergeron on: 04/24/2022 02:30 PM   Modules accepted: Orders

## 2022-04-24 NOTE — Addendum Note (Signed)
Addended by: Freada Bergeron on: 04/24/2022 01:04 PM   Modules accepted: Orders

## 2022-04-29 ENCOUNTER — Encounter: Payer: Self-pay | Admitting: Urology

## 2022-04-29 ENCOUNTER — Encounter: Payer: Self-pay | Admitting: Physical Medicine and Rehabilitation

## 2022-04-29 NOTE — Addendum Note (Signed)
Addended by: Freada Bergeron on: 04/29/2022 06:16 PM   Modules accepted: Orders

## 2022-04-29 NOTE — Telephone Encounter (Signed)
CPAP titration IS DENIED must have a trial of APAP.  If unable to perform an in lab titration then initiate ResMed auto CPAP from 4 to 15cm H2O with heated humidity and mask of choice and overnight pulse ox on CPAP.      Upon patient request DME selection is [facility] Patient understands he will be contacted by [facility] to set up his cpap. Patient understands to call if [facility] does not contact him with new setup in a timely manner. Patient understands they will be called once confirmation has been received from [employer]/ that they have received their new machine to schedule 10 week follow up appointment.   [facility] notified of new cpap order  Please add to airview Patient was grateful for the call and thanked me.

## 2022-04-29 NOTE — Telephone Encounter (Signed)
Please update patient insurance information.  Thanks

## 2022-04-30 NOTE — Telephone Encounter (Signed)
Hi Blair Once you return can you update the patient information he listed, Please and thank you

## 2022-05-20 ENCOUNTER — Ambulatory Visit: Payer: 59 | Admitting: Physical Medicine and Rehabilitation

## 2022-05-20 ENCOUNTER — Ambulatory Visit: Payer: 59

## 2022-05-20 VITALS — BP 96/61 | HR 60

## 2022-05-20 DIAGNOSIS — M5416 Radiculopathy, lumbar region: Secondary | ICD-10-CM

## 2022-05-20 MED ORDER — METHYLPREDNISOLONE ACETATE 80 MG/ML IJ SUSP
80.0000 mg | Freq: Once | INTRAMUSCULAR | Status: AC
  Administered 2022-05-20: 80 mg

## 2022-05-20 NOTE — Progress Notes (Signed)
Pt state lower back pain . Pt state walking, standing and bending makes the pain worse. Pt state he takes pain meds to help ease his pain.  Numeric Pain Rating Scale and Functional Assessment Average Pain 6   In the last MONTH (on 0-10 scale) has pain interfered with the following?  1. General activity like being  able to carry out your everyday physical activities such as walking, climbing stairs, carrying groceries, or moving a chair?  Rating(8)   +Driver, -BT, -Dye Allergies.

## 2022-05-20 NOTE — Patient Instructions (Signed)

## 2022-05-29 NOTE — Procedures (Signed)
Lumbar Epidural Steroid Injection - Interlaminar Approach with Fluoroscopic Guidance  Patient: Ruben Duffy      Date of Birth: [birth year] MRN: [id number] PCP: [facility]      Visit Date: 05/20/2022   Universal Protocol:     Consent Given By: the patient  Position: PRONE  Additional Comments: Vital signs were monitored before and after the procedure. Patient was prepped and draped in the usual sterile fashion. The correct patient, procedure, and site was verified.   Injection Procedure Details:   Procedure diagnoses: Lumbar radiculopathy [M54.16]   Meds Administered:  Meds ordered this encounter  Medications   methylPREDNISolone acetate (DEPO-MEDROL) injection 80 mg     Laterality: Left  Location/Site:  L5-S1  Needle: 3.5 in., 20 ga. Tuohy  Needle Placement: Paramedian epidural  Findings:   -Comments: Excellent flow of contrast into the epidural space.  Procedure Details: Using a paramedian approach from the side mentioned above, the region overlying the inferior lamina was localized under fluoroscopic visualization and the soft tissues overlying this structure were infiltrated with 4 ml. of 1% Lidocaine without Epinephrine. The Tuohy needle was inserted into the epidural space using a paramedian approach.   The epidural space was localized using loss of resistance along with counter oblique bi-planar fluoroscopic views.  After negative aspirate for air, blood, and CSF, a 2 ml. volume of Isovue-250 was injected into the epidural space and the flow of contrast was observed. Radiographs were obtained for documentation purposes.    The injectate was administered into the level noted above.   Additional Comments:  No complications occurred Dressing: 2 x 2 sterile gauze and Band-Aid    Post-procedure details: Patient was observed during the procedure. Post-procedure instructions were reviewed.  Patient left the clinic in stable condition.

## 2022-05-29 NOTE — Progress Notes (Signed)
Ruben Duffy - 56 y.o. male MRN [id number]  Date of birth: 10-24-1965  Office Visit Note: Visit Date: 05/20/2022 PCP: [facility] Referred by: [facility]  Subjective: Chief Complaint  Patient presents with   Lower Back - Pain   HPI:  Ruben Duffy is a 56 y.o. male who comes in today at the request of Dr. Rodell Perna for planned Left L5-S1 Lumbar Interlaminar epidural steroid injection with fluoroscopic guidance.  The patient has failed conservative care including home exercise, medications, time and activity modification.  This injection will be diagnostic and hopefully therapeutic.  Please see requesting physician notes for further details and justification.  Patient has significant facet arthropathy at L5-S1 and lesser so at L3-4.  Consider facet joint block.   ROS Otherwise per HPI.  Assessment & Plan: Visit Diagnoses:    ICD-10-CM   1. Lumbar radiculopathy  M54.16 XR C-ARM NO REPORT    Epidural Steroid injection    methylPREDNISolone acetate (DEPO-MEDROL) injection 80 mg      Plan: No additional findings.   Meds & Orders:  Meds ordered this encounter  Medications   methylPREDNISolone acetate (DEPO-MEDROL) injection 80 mg    Orders Placed This Encounter  Procedures   XR C-ARM NO REPORT   Epidural Steroid injection    Follow-up: Return for visit to requesting provider as needed.   Procedures: No procedures performed  Lumbar Epidural Steroid Injection - Interlaminar Approach with Fluoroscopic Guidance  Patient: Ruben Duffy      Date of Birth: 1966-02-16 MRN: [id number] PCP: [facility]      Visit Date: 05/20/2022   Universal Protocol:     Consent Given By: the patient  Position: PRONE  Additional Comments: Vital signs were monitored before and after the procedure. Patient was prepped and draped in the usual sterile fashion. The correct patient, procedure, and site was verified.   Injection Procedure Details:    Procedure diagnoses: Lumbar radiculopathy [M54.16]   Meds Administered:  Meds ordered this encounter  Medications   methylPREDNISolone acetate (DEPO-MEDROL) injection 80 mg     Laterality: Left  Location/Site:  L5-S1  Needle: 3.5 in., 20 ga. Tuohy  Needle Placement: Paramedian epidural  Findings:   -Comments: Excellent flow of contrast into the epidural space.  Procedure Details: Using a paramedian approach from the side mentioned above, the region overlying the inferior lamina was localized under fluoroscopic visualization and the soft tissues overlying this structure were infiltrated with 4 ml. of 1% Lidocaine without Epinephrine. The Tuohy needle was inserted into the epidural space using a paramedian approach.   The epidural space was localized using loss of resistance along with counter oblique bi-planar fluoroscopic views.  After negative aspirate for air, blood, and CSF, a 2 ml. volume of Isovue-250 was injected into the epidural space and the flow of contrast was observed. Radiographs were obtained for documentation purposes.    The injectate was administered into the level noted above.   Additional Comments:  No complications occurred Dressing: 2 x 2 sterile gauze and Band-Aid    Post-procedure details: Patient was observed during the procedure. Post-procedure instructions were reviewed.  Patient left the clinic in stable condition.   Clinical History: MRI LUMBAR SPINE WITHOUT CONTRAST   TECHNIQUE: Multiplanar, multisequence MR imaging of the lumbar spine was performed. No intravenous contrast was administered.   COMPARISON:  None Available.   FINDINGS: Segmentation:  Standard.   Alignment: Trace retrolisthesis at L1-L2. Trace anterolisthesis at L5-S1.   Vertebrae:  Chronic anterior wedging of L1. Degenerative endplate irregularity at T12-L1: L1-L2, L3-L4, and L4-L5. Mild degenerative endplate marrow edema. Vertebral body hemangioma at L4.   Conus  medullaris and cauda equina: Conus extends to the L1 level. Conus and cauda equina appear normal.   Paraspinal and other soft tissues: Unremarkable.   Disc levels:   T12-L1: Minimal disc bulge.  No canal or foraminal stenosis.   L1-L2: Disc bulge with superimposed left foraminal protrusion and endplate osteophytic ridging. Minor facet arthropathy. No canal stenosis. No right foraminal stenosis. Mild to moderate left foraminal stenosis.   L2-L3: Disc bulge slightly eccentric to the right. Moderate facet arthropathy. No canal stenosis. Minor effacement of the right subarticular recess. No foraminal stenosis.   L3-L4: Disc bulge with superimposed right foraminal/far lateral protrusion and endplate osteophytic ridging. Moderate facet arthropathy with ligamentum flavum infolding. Mild canal stenosis. Partial effacement of the right subarticular recess. Moderate right and minor left foraminal stenosis.   L4-L5:  Minor facet arthropathy.  No canal or foraminal stenosis.   L5-S1: Anterolisthesis with uncovering of disc bulge with superimposed left foraminal protrusion. Moderate to marked facet arthropathy with ligamentum flavum infolding. No canal stenosis. Minor right and marked left foraminal stenosis.   IMPRESSION: Multilevel degenerative changes as detailed above. Facet arthropathy is greatest at L2-L3, L3-L4, and L5-S1. No high-grade canal stenosis. Foraminal narrowing is greatest on the right at L3-L4 and on the left at L5-S1. Right subarticular recess narrowing is present at L3-L4.     Electronically Signed   By: Macy Mis M.D.   On: 03/08/2022 12:31     Objective:  VS:  HT:    WT:   BMI:     BP:96/61  HR:60bpm  TEMP: ( )  RESP:  Physical Exam   Imaging: No results found.

## 2022-06-07 ENCOUNTER — Encounter: Payer: Self-pay | Admitting: Urology

## 2022-06-16 ENCOUNTER — Encounter: Payer: Self-pay | Admitting: Internal Medicine

## 2022-07-06 ENCOUNTER — Other Ambulatory Visit (HOSPITAL_COMMUNITY): Payer: Self-pay | Admitting: Internal Medicine

## 2022-07-06 ENCOUNTER — Other Ambulatory Visit: Payer: Self-pay | Admitting: Urology

## 2022-07-08 ENCOUNTER — Ambulatory Visit (INDEPENDENT_AMBULATORY_CARE_PROVIDER_SITE_OTHER): Payer: 59

## 2022-07-08 DIAGNOSIS — I447 Left bundle-branch block, unspecified: Secondary | ICD-10-CM | POA: Diagnosis not present

## 2022-07-09 LAB — CUP PACEART REMOTE DEVICE CHECK
Battery Remaining Longevity: 58 mo
Battery Remaining Percentage: 94 %
Battery Voltage: 3.19 V
Brady Statistic AP VP Percent: 1.2 %
Brady Statistic AP VS Percent: 1 %
Brady Statistic AS VP Percent: 99 %
Brady Statistic AS VS Percent: 1 %
Brady Statistic RA Percent Paced: 1.2 %
Date Time Interrogation Session: 20230925020016
HighPow Impedance: 72 Ohm
HighPow Impedance: 72 Ohm
Implantable Lead Implant Date: 20230626
Implantable Lead Implant Date: 20230626
Implantable Lead Implant Date: 20230626
Implantable Lead Location: 753858
Implantable Lead Location: 753859
Implantable Lead Location: 753860
Implantable Lead Model: 7122
Implantable Pulse Generator Implant Date: 20230626
Lead Channel Impedance Value: 1125 Ohm
Lead Channel Impedance Value: 580 Ohm
Lead Channel Impedance Value: 590 Ohm
Lead Channel Pacing Threshold Amplitude: 0.5 V
Lead Channel Pacing Threshold Amplitude: 0.75 V
Lead Channel Pacing Threshold Amplitude: 1.5 V
Lead Channel Pacing Threshold Pulse Width: 0.5 ms
Lead Channel Pacing Threshold Pulse Width: 0.5 ms
Lead Channel Pacing Threshold Pulse Width: 0.8 ms
Lead Channel Sensing Intrinsic Amplitude: 11.6 mV
Lead Channel Sensing Intrinsic Amplitude: 3.5 mV
Lead Channel Setting Pacing Amplitude: 3.5 V
Lead Channel Setting Pacing Amplitude: 3.5 V
Lead Channel Setting Pacing Amplitude: 3.5 V
Lead Channel Setting Pacing Pulse Width: 0.5 ms
Lead Channel Setting Pacing Pulse Width: 0.8 ms
Lead Channel Setting Sensing Sensitivity: 0.5 mV
Pulse Gen Serial Number: 8949760

## 2022-07-11 ENCOUNTER — Ambulatory Visit: Payer: 59 | Attending: Internal Medicine | Admitting: Internal Medicine

## 2022-07-11 ENCOUNTER — Encounter: Payer: Self-pay | Admitting: Internal Medicine

## 2022-07-11 VITALS — BP 102/62 | HR 60 | Ht 68.0 in | Wt 190.0 lb

## 2022-07-11 DIAGNOSIS — I5022 Chronic systolic (congestive) heart failure: Secondary | ICD-10-CM

## 2022-07-11 DIAGNOSIS — I428 Other cardiomyopathies: Secondary | ICD-10-CM

## 2022-07-11 DIAGNOSIS — I447 Left bundle-branch block, unspecified: Secondary | ICD-10-CM

## 2022-07-11 DIAGNOSIS — Z79899 Other long term (current) drug therapy: Secondary | ICD-10-CM

## 2022-07-11 NOTE — Progress Notes (Signed)
Patient Care Team: [facility], [facility] as PCP - General Gwenlyn Found Pearletha Forge, MD as PCP - Cardiology (Cardiology) Tyler Pita, MD as Consulting Physician (Radiation Oncology) Irine Seal, MD as Attending Physician (Urology) Cordelia Poche as Physician Assistant (Hematology and Oncology) Harmon Pier, RN as Registered Nurse   HPI  Ruben Duffy is a 56 y.o. 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  No chest pain.  No edema no nocturnal dyspnea.  Patient denies symptoms of respiratory, GI intolerance, sun sensitivity, neurological symptoms attributable to amiodarone.        DATE TEST EF    11/22 Echo  20-25% MR severe central  11/22 LHC   Non obstructive CAD  11/22 cMRI 14% Biventricular failure    3/23 Echo  25-30% MR mild- mod    Date Cr K Hgb TSH LFTs Dig  11/22     13.7      3/23 1.02 4.9    1.47  0.4   7/23 1.33 4.7 14.3  47 (6/23)      Records and Results Reviewed   Past Medical History:  Diagnosis Date   Arthritis    CHF (congestive heart failure) ([place])    Emphysema of lung (H[facility])    GERD (gastroesophageal reflux disease)    Hypertension    Hypospadias, balanic    chronic   LBBB (left bundle branch block)    newly dx by ekg done 04-26-2021 for surgery on 06-05-2021,  pt stated he not had a previous ekg before, so pt sent for cardiac clearance prior to surgery;  cardiologist , dr berry, office note clearance in epic 05-09-2021 gave pt clearance with no further work-up needed   Lower urinary tract symptoms (LUTS)    Prostate cancer [facility])    urologist-- dr Jeffie Pollock--- dx by bx 01-08-2021, Gleason 3+4, T2a   Substance abuse ([place])     Past Surgical History:  Procedure Laterality Date   BIOPSY PROSTATE     BIV ICD INSERTION CRT-D N/A 04/08/2022    Procedure: BIV ICD INSERTION CRT-D;  Surgeon: Deboraha Sprang, MD;  Location: [place] [facility];  Service: Cardiovascular;  Laterality: N/A;   CIRCUMCISION N/A 06/05/2021   Procedure: CIRCUMCISION ADULT;  Surgeon: Irine Seal, MD;  Location: [facility];  Service: Urology;  Laterality: N/A;   HEMORRHOID SURGERY     HYPOSPADIAS CORRECTION     RADIOACTIVE SEED IMPLANT N/A 06/05/2021   Procedure: RADIOACTIVE SEED IMPLANT/BRACHYTHERAPY IMPLANT;  Surgeon: Irine Seal, MD;  Location: [facility];  Service: Urology;  Laterality: N/A;   RIGHT/LEFT HEART CATH AND CORONARY ANGIOGRAPHY N/A 09/10/2021   Procedure: RIGHT/LEFT HEART CATH AND CORONARY ANGIOGRAPHY;  Surgeon: Jolaine Artist, MD;  Location: [place] [facility];  Service: Cardiovascular;  Laterality: N/A;   SPACE OAR INSTILLATION N/A 06/05/2021   Procedure: SPACE OAR INSTILLATION;  Surgeon: Irine Seal, MD;  Location: [facility];  Service: Urology;  Laterality: N/A;    Current Meds  Medication Sig   amiodarone (PACERONE) 200 MG tablet Take 1 tablet (200 mg total) by mouth daily.   atorvastatin (LIPITOR) 40 MG tablet Take 1 tablet (40 mg total) by mouth daily.   carvedilol (COREG) 3.125 MG tablet Take 1 tablet (3.125 mg total) by mouth 2 (two) times  daily with a meal.   cyclobenzaprine (FLEXERIL) 10 MG tablet SMARTSIG:1 Tablet(s) By Mouth Every Evening PRN   dapagliflozin propanediol (FARXIGA) 10 MG TABS tablet Take 1 tablet (10 mg total) by mouth daily.   digoxin (LANOXIN) 0.125 MG tablet Take 0.5 tablets (0.0625 mg total) by mouth daily.   DULoxetine (CYMBALTA) 60 MG capsule Take 60 mg by mouth daily.   furosemide (LASIX) 20 MG tablet Take 3 tablets (60 mg total) by mouth daily.   magnesium oxide (MAG-OX) 400 MG tablet Take 1/2 tablet (200 mg total) by mouth daily.   Multiple Vitamin (MULTIVITAMIN ADULT PO) Take by mouth.   omeprazole (PRILOSEC) 40 MG capsule Take 40 mg by mouth daily.    potassium chloride SA (KLOR-CON M) 20 MEQ tablet Take 1 tablet by mouth once daily   sacubitril-valsartan (ENTRESTO) 24-26 MG Take 1 tablet by mouth 2 (two) times daily.   sildenafil (REVATIO) 20 MG tablet TAKE 1- 5 TABLETS BY MOUTH AS NEEDED   silodosin (RAPAFLO) 8 MG CAPS capsule Take 1 capsule (8 mg total) by mouth daily with breakfast.   spironolactone (ALDACTONE) 25 MG tablet Take 1 tablet (25 mg total) by mouth daily.   traMADol (ULTRAM) 50 MG tablet Take 50 mg by mouth 2 (two) times daily as needed.   triamcinolone (KENALOG) 0.025 % ointment Apply topically daily as needed (itching, rash).   valACYclovir (VALTREX) 500 MG tablet Take 500 mg by mouth 2 (two) times daily as needed.    No Known Allergies    Review of Systems negative except from HPI and PMH  Physical Exam BP 102/62   Pulse 60   Ht '5\' 8"'$  (1.727 m)   Wt 190 lb (86.2 kg)   SpO2 91%   BMI 28.89 kg/m  Well developed and well nourished in no acute distress HENT normal E scleral and icterus clear Neck Supple JVP flat; carotids brisk and full Clear to ausculation Regular rate and rhythm, no murmurs gallops or rub Soft with active bowel sounds No clubbing cyanosis  Edema Alert and oriented, grossly normal motor and sensory function Skin Warm and Dry  ECG P synchronous pacing with a rS lead V1 and a qR in lead I With reprogramming of the vector advancing LV before RV by 35 ms and shorten the delay from 100--80 ms the vector is largely upright in lead V1  CrCl cannot be calculated (Patient's most recent lab result is older than the maximum 21 days allowed.).   Assessment and  Plan  Nonischemic cardiomyopathy with biventricular involvement   Left bundle branch block fractionation   Congestive heart failure-systolic-class 2b  ICD-CRT-[employer] Jude   MR mild-moderate   PVCs  High Risk Medication Surveillance-amiodarone    Already on amiodarone.  We will check surveillance laboratories as last transaminases  were elevated  We will do echocardiogram  Reprogramming has further improved electrical resynchronization.  Continue carvedilol spironolactone digoxin Wilder Glade and Entresto.  We will check a digoxin level.   Current medicines are reviewed at length with the patient today .  The patient does not have concerns regarding medicines.

## 2022-07-11 NOTE — Patient Instructions (Addendum)
Medication Instructions:  Your physician recommends that you continue on your current medications as directed. Please refer to the Current Medication list given to you today.  *If you need a refill on your cardiac medications before your next appointment, please call your pharmacy*   Lab Work: Liver Panel and Digoxin Level today  If you have labs (blood work) drawn today and your tests are completely normal, you will receive your results only by: [place] (if you have MyChart) OR A paper copy in the mail If you have any lab test that is abnormal or we need to change your treatment, we will call you to review the results.   Testing/Procedures: Your physician has requested that you have an echocardiogram. Echocardiography is a painless test that uses sound waves to create images of your heart. It provides your doctor with information about the size and shape of your heart and how well your heart's chambers and valves are working. This procedure takes approximately one hour. There are no restrictions for this procedure.    Follow-Up: At [facility], you and your health needs are our priority.  As part of our continuing mission to provide you with exceptional heart care, we have created designated Provider Care Teams.  These Care Teams include your primary Cardiologist (physician) and Advanced Practice Providers (APPs -  Physician Assistants and Nurse Practitioners) who all work together to provide you with the care you need, when you need it.  We recommend signing up for the patient portal called "MyChart".  Sign up information is provided on this After Visit Summary.  MyChart is used to connect with patients for Virtual Visits (Telemedicine).  Patients are able to view lab/test results, encounter notes, upcoming appointments, etc.  Non-urgent messages can be sent to your provider as well.   To learn more about what you can do with MyChart, go to [email or web address].     Your next appointment:   9 months with Dr Caryl Comes  Important Information About Sugar

## 2022-07-12 LAB — HEPATIC FUNCTION PANEL
ALT: 32 IU/L (ref 0–44)
AST: 32 IU/L (ref 0–40)
Albumin: 4.2 g/dL (ref 3.8–4.9)
Alkaline Phosphatase: 85 IU/L (ref 44–121)
Bilirubin Total: 0.4 mg/dL (ref 0.0–1.2)
Bilirubin, Direct: 0.16 mg/dL (ref 0.00–0.40)
Total Protein: 6.5 g/dL (ref 6.0–8.5)

## 2022-07-12 LAB — DIGOXIN LEVEL: Digoxin, Serum: 0.6 ng/mL (ref 0.5–0.9)

## 2022-07-17 ENCOUNTER — Telehealth: Payer: Self-pay | Admitting: Urology

## 2022-07-18 ENCOUNTER — Other Ambulatory Visit: Payer: Self-pay

## 2022-07-18 DIAGNOSIS — N3281 Overactive bladder: Secondary | ICD-10-CM

## 2022-07-18 MED ORDER — MIRABEGRON ER 25 MG PO TB24: 25.0000 mg | ORAL_TABLET | Freq: Every day | ORAL | 11 refills | Status: DC

## 2022-07-20 ENCOUNTER — Encounter: Payer: Self-pay | Admitting: Internal Medicine

## 2022-07-20 ENCOUNTER — Encounter: Payer: Self-pay | Admitting: Orthopaedic Surgery

## 2022-07-22 NOTE — Telephone Encounter (Signed)
noted 

## 2022-07-24 ENCOUNTER — Encounter: Payer: Self-pay | Admitting: Internal Medicine

## 2022-07-25 ENCOUNTER — Telehealth (HOSPITAL_COMMUNITY): Payer: Self-pay | Admitting: Internal Medicine

## 2022-07-25 NOTE — Telephone Encounter (Signed)
Pt requesting to talk to DB

## 2022-07-25 NOTE — Progress Notes (Signed)
Remote ICD transmission.   

## 2022-07-25 NOTE — Telephone Encounter (Signed)
Pt left vm wanting a call from a provider about his medications. Pt said he is having SOB and nausea and thinks the meds are causing it (specifically amiodarone). Pt wants to know how long he will be on all of his medications.   Pt saw Janett Billow [place],FNP at last visit. Message routed to provider

## 2022-07-26 NOTE — Telephone Encounter (Signed)
Called home phone the line was busy. Called mobile left vm.

## 2022-08-03 ENCOUNTER — Encounter: Payer: Self-pay | Admitting: Urology

## 2022-08-05 ENCOUNTER — Encounter (HOSPITAL_COMMUNITY): Payer: Self-pay | Admitting: Internal Medicine

## 2022-08-05 ENCOUNTER — Ambulatory Visit (HOSPITAL_COMMUNITY)
Admission: RE | Admit: 2022-08-05 | Discharge: 2022-08-05 | Disposition: A | Discharge status: Home / Self Care | Payer: 59 | Source: Ambulatory Visit | Attending: Internal Medicine | Admitting: Internal Medicine

## 2022-08-05 VITALS — BP 110/60 | HR 66 | Wt 182.0 lb

## 2022-08-05 DIAGNOSIS — I5022 Chronic systolic (congestive) heart failure: Secondary | ICD-10-CM | POA: Diagnosis not present

## 2022-08-05 DIAGNOSIS — R7303 Prediabetes: Secondary | ICD-10-CM | POA: Diagnosis not present

## 2022-08-05 DIAGNOSIS — I428 Other cardiomyopathies: Secondary | ICD-10-CM | POA: Diagnosis not present

## 2022-08-05 DIAGNOSIS — I110 Hypertensive heart disease with heart failure: Secondary | ICD-10-CM | POA: Insufficient documentation

## 2022-08-05 DIAGNOSIS — I340 Nonrheumatic mitral (valve) insufficiency: Secondary | ICD-10-CM | POA: Diagnosis not present

## 2022-08-05 DIAGNOSIS — I447 Left bundle-branch block, unspecified: Secondary | ICD-10-CM | POA: Diagnosis not present

## 2022-08-05 DIAGNOSIS — Z87891 Personal history of nicotine dependence: Secondary | ICD-10-CM | POA: Diagnosis not present

## 2022-08-05 DIAGNOSIS — Z01818 Encounter for other preprocedural examination: Secondary | ICD-10-CM | POA: Diagnosis not present

## 2022-08-05 DIAGNOSIS — J439 Emphysema, unspecified: Secondary | ICD-10-CM | POA: Diagnosis not present

## 2022-08-05 DIAGNOSIS — I493 Ventricular premature depolarization: Secondary | ICD-10-CM | POA: Diagnosis not present

## 2022-08-05 DIAGNOSIS — R0683 Snoring: Secondary | ICD-10-CM | POA: Diagnosis not present

## 2022-08-05 DIAGNOSIS — Z79899 Other long term (current) drug therapy: Secondary | ICD-10-CM | POA: Diagnosis not present

## 2022-08-05 DIAGNOSIS — I2510 Atherosclerotic heart disease of native coronary artery without angina pectoris: Secondary | ICD-10-CM | POA: Insufficient documentation

## 2022-08-05 DIAGNOSIS — Z8546 Personal history of malignant neoplasm of prostate: Secondary | ICD-10-CM | POA: Insufficient documentation

## 2022-08-05 LAB — COMPREHENSIVE METABOLIC PANEL
ALT: 38 U/L (ref 0–44)
AST: 33 U/L (ref 15–41)
Albumin: 4.2 g/dL (ref 3.5–5.0)
Alkaline Phosphatase: 74 U/L (ref 38–126)
Anion gap: 11 (ref 5–15)
BUN: 7 mg/dL (ref 6–20)
CO2: 25 mmol/L (ref 22–32)
Calcium: 9.8 mg/dL (ref 8.9–10.3)
Chloride: 99 mmol/L (ref 98–111)
Creatinine, Ser: 0.87 mg/dL (ref 0.61–1.24)
GFR, Estimated: >60 mL/min (ref >60)
Glucose, Bld: 136 mg/dL — ABNORMAL HIGH (ref 70–99)
Potassium: 4.2 mmol/L (ref 3.5–5.1)
Sodium: 135 mmol/L (ref 135–145)
Total Bilirubin: 0.5 mg/dL (ref 0.3–1.2)
Total Protein: 7.4 g/dL (ref 6.5–8.1)

## 2022-08-05 LAB — TSH: TSH: 1.261 u[IU]/mL (ref 0.350–4.500)

## 2022-08-05 LAB — BRAIN NATRIURETIC PEPTIDE: B Natriuretic Peptide: 18.1 pg/mL (ref 0.0–100.0)

## 2022-08-05 LAB — T4, FREE: Free T4: 1.02 ng/dL (ref 0.61–1.12)

## 2022-08-05 MED ORDER — AMIODARONE HCL 200 MG PO TABS: 100.0000 mg | ORAL_TABLET | Freq: Every day | ORAL | 3 refills | Status: DC

## 2022-08-05 NOTE — Patient Instructions (Signed)
Medication Changes:  STOP Digoxin  DECREASE Amiodarone to 100 mg (1/2 tab) Daily  Lab Work:  Labs done today, your results will be available in MyChart, we will contact you for abnormal readings.  Testing/Procedures:  Echocardiogram scheduled for Thursday 10/26  Referrals:  none  Special Instructions // Education:  Do the following things EVERYDAY: Weigh yourself in the morning before breakfast. Write it down and keep it in a log. Take your medicines as prescribed Eat low salt foods--Limit salt (sodium) to 2000 mg per day.  Stay as active as you can everyday Limit all fluids for the day to less than 2 liters   Follow-Up in: 4 months  At the [facility], you and your health needs are our priority. We have a designated team specialized in the treatment of Heart Failure. This Care Team includes your primary Heart Failure Specialized Cardiologist (physician), Advanced Practice Providers (APPs- Physician Assistants and Nurse Practitioners), and Pharmacist who all work together to provide you with the care you need, when you need it.   You may see any of the following providers on your designated Care Team at your next follow up:  Dr. Glori Bickers Dr. Loralie Champagne Dr. Roxana Hires, NP Lyda Jester, [place] [facility] [place], [place] Forestine Na, NP Audry Riles, PharmD   Please be sure to bring in all your medications bottles to every appointment.   Need to Contact us:  If you have any questions or concerns before your next appointment please send [place] a message through [place] or call our office at [phone].    TO LEAVE A MESSAGE FOR THE NURSE SELECT OPTION 2, PLEASE LEAVE A MESSAGE INCLUDING: YOUR NAME DATE OF BIRTH CALL BACK NUMBER REASON FOR CALL**this is important as we prioritize the call backs  YOU WILL RECEIVE A CALL BACK THE SAME DAY AS LONG AS YOU CALL BEFORE 4:00 PM

## 2022-08-05 NOTE — Progress Notes (Signed)
Advanced Heart Failure Clinic Note   Primary Care: [facility] EP: Dr. Caryl Comes HF Cardiologist: Dr. 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  He was seen by Dr Gwenlyn Found 04/2021 for preop eval before radioactive seed implant and circumcision. He had LBBB of unknown duration, BP well-controlled. No further workup pre-procedure.  Admitted 09/32 for a/c systolic HF. Echo EF 20-25%. LV dilated (6.5cm) with severe central MR. Global HK with ? AK of anteroseptum.  Diuresed with IV lasix. Initiated on GDMT with digoxin, spironolactone, Entresto and Farxiga. Frequent PVCs, started on amio w/ reduction in PVC burden. R/LHC showed minimal nonobstructive CAD, cMRI showed LVEF 14%, diffuse HK, mild/mod RV dilation w/ severe systolic dysfunction, RVEF 15%, small nonspecific basal inferoseptal RV insertion site LGE, mild elevation in ECV, not suggestive of cardiac amyloidosis. 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 LifeVest placed  Home sleep study 12/22 AHI 0.6/hr  Echo 01/11/22: EF 25-30% LV markedly dilated. Severe dyssynchrony. RV normal. Mild to moderate MR. Referred to EP for CRT-D.  Today he returns for HF follow up. Underwent CRT-D implant 6/23. Overall feeling ok, continues to drive forklift. Has SOB when walking up a hill or steps, not SOB during reg ambulation. Nausea, fatigue, and pruritis for a couple of weeks now. Not getting better. EP made some device adjustments last month, no improvement felt. Denies palpitations, CP, dizziness, edema, or PND/Orthopnea. Not much of an appetite, down ~20lbs. No fever or chills. Taking all medications.  Device interrogation: AT/AF burden 0%, PVCs <1%, BP >99%  Cardiac Studies: - Echo (3/23): EF 25-30%, LV markedly dilated, severe dyssynchrony, RV ok, mild to moderate MR.  - Cardiac MRI (11/22):  LVEF 14%, diffuse HK,  mild/mod RV dilation w/ severe systolic dysfunction, RVEF 15%, small nonspecific basal inferoseptal RV insertion site LGE, mild elevation in ECV, not suggestive of cardiac amyloidosis.   - Echo (11/22): EF 20-25%. LV dilated (6.5cm) with severe central MR. Global HK with ? AK of anteroseptum   - RHC/LHC with minimal obstructive CAD, low filling pressure, normal output and severe NICM   ROS: All systems reviewed and negative except as per HPI.   Past Medical History:  Diagnosis Date   Arthritis    CHF (congestive heart failure) (HCC)    Emphysema of lung (HCC)    GERD (gastroesophageal reflux disease)    Hypertension    Hypospadias, balanic    chronic   LBBB (left bundle branch block)    newly dx by ekg done 04-26-2021 for surgery on 06-05-2021,  pt stated he not had a previous ekg before, so pt sent for cardiac clearance prior to surgery;  cardiologist , dr berry, office note clearance in epic 05-09-2021 gave pt clearance with no further work-up needed   Lower urinary tract symptoms (LUTS)    Prostate cancer [facility])    urologist-- dr Jeffie Pollock--- dx by bx 01-08-2021, Gleason 3+4, T2a   Substance abuse ([place])     Current Outpatient Medications  Medication Sig Dispense Refill   amiodarone (PACERONE) 200 MG tablet Take 1 tablet (200 mg total) by mouth daily. 90 tablet 3   atorvastatin (LIPITOR) 40 MG tablet Take 1 tablet (40 mg total) by mouth daily. 90 tablet 3   carvedilol (COREG) 3.125 MG tablet Take 1 tablet (3.125 mg total) by mouth 2 (two) times daily with a meal. 180  tablet 3   cyclobenzaprine (FLEXERIL) 10 MG tablet SMARTSIG:1 Tablet(s) By Mouth Every Evening PRN     dapagliflozin propanediol (FARXIGA) 10 MG TABS tablet Take 1 tablet (10 mg total) by mouth daily. 90 tablet 1   digoxin (LANOXIN) 0.125 MG tablet Take 0.5 tablets (0.0625 mg total) by mouth daily. 45 tablet 3   DULoxetine (CYMBALTA) 60 MG capsule Take 60 mg by mouth daily.     furosemide (LASIX) 20 MG tablet Take 3  tablets (60 mg total) by mouth daily. 260 tablet 3   magnesium oxide (MAG-OX) 400 MG tablet Take 1/2 tablet (200 mg total) by mouth daily. 30 tablet 6   mirabegron ER (MYRBETRIQ) 25 MG TB24 tablet Take 1 tablet (25 mg total) by mouth daily. 28 tablet 11   Multiple Vitamin (MULTIVITAMIN ADULT PO) Take by mouth.     omeprazole (PRILOSEC) 40 MG capsule Take 40 mg by mouth daily.     potassium chloride SA (KLOR-CON M) 20 MEQ tablet Take 1 tablet by mouth once daily 30 tablet 11   sacubitril-valsartan (ENTRESTO) 24-26 MG Take 1 tablet by mouth 2 (two) times daily. 180 tablet 3   sildenafil (REVATIO) 20 MG tablet TAKE 1- 5 TABLETS BY MOUTH AS NEEDED 30 tablet 0   silodosin (RAPAFLO) 8 MG CAPS capsule Take 1 capsule (8 mg total) by mouth daily with breakfast. 30 capsule 11   spironolactone (ALDACTONE) 25 MG tablet Take 1 tablet (25 mg total) by mouth daily. 90 tablet 3   traMADol (ULTRAM) 50 MG tablet Take 50 mg by mouth 2 (two) times daily as needed.     triamcinolone (KENALOG) 0.025 % ointment Apply topically daily as needed (itching, rash).     valACYclovir (VALTREX) 500 MG tablet Take 500 mg by mouth 2 (two) times daily as needed.     No current facility-administered medications for this encounter.   No Known Allergies  Social History   Socioeconomic History   Marital status: Single    Spouse name: Not on file   Number of children: 1   Years of education: Not on file   Highest education level: Not on file  Occupational History   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  Tobacco Use   Smoking status: Former    Packs/day: 1.50    Years: 30.00    Total pack years: 45.00    Types: Cigarettes    Quit date: 2009    Years since quitting: 14.8   Smokeless tobacco: Never  Vaping Use   Vaping Use: Never used  Substance and Sexual Activity   Alcohol use: Not Currently   Drug use: Not Currently    Types: Marijuana, "Crack" cocaine   Sexual activity: Yes  Other Topics  Concern   Not on file  Social History Narrative   Not on file   Social Determinants of Health   Financial Resource Strain: Low Risk  (07/27/2021)   Overall Financial Resource Strain (CARDIA)    Difficulty of Paying Living Expenses: Not hard at all  Food Insecurity: No Food Insecurity (07/27/2021)   Hunger Vital Sign    Worried About Running Out of Food in the Last Year: Never true    [place] in the Last Year: Never true  Transportation Needs: No Transportation Needs (07/27/2021)   PRAPARE - Hydrologist (Medical): No    Lack of Transportation (Non-Medical): No  Physical Activity: Inactive (07/27/2021)   Exercise  Vital Sign    Days of Exercise per Week: 0 days    Minutes of Exercise per Session: 0 min  Stress: No Stress Concern Present (07/27/2021)   [place]    Feeling of Stress : Not at all  Social Connections: Socially Isolated (07/27/2021)   Social Connection and Isolation Panel [NHANES]    Frequency of Communication with Friends and Family: Three times a week    Frequency of Social Gatherings with Friends and Family: Three times a week    Attends Religious Services: Never    Active Member of Clubs or Organizations: No    Attends Archivist Meetings: Never    Marital Status: Never married  Intimate Partner Violence: Not At Risk (07/27/2021)   Humiliation, Afraid, Rape, and Kick questionnaire    Fear of Current or Ex-Partner: No    Emotionally Abused: No    Physically Abused: No    Sexually Abused: No    Family History  Problem Relation Age of Onset   Stroke Father    Lung cancer Maternal Aunt    Diabetes Maternal Uncle    Brain cancer Maternal Uncle    Lung cancer Maternal Uncle    Lung cancer Maternal Uncle    Cancer Paternal Grandmother    Diabetes Paternal Grandfather    Colon cancer Neg Hx    Esophageal cancer Neg Hx    Stomach cancer Neg Hx     Pancreatic cancer Neg Hx    Colon polyps Neg Hx    Rectal cancer Neg Hx    There were no vitals taken for this visit.  Wt Readings from Last 3 Encounters:  07/11/22 86.2 kg (190 lb)  04/11/22 89.8 kg (198 lb)  04/08/22 89.8 kg (198 lb)   PHYSICAL EXAM: General:  well appearing.  No respiratory difficulty HEENT: normal Neck: supple. No JVD. Carotids 2+ bilat; no bruits. No lymphadenopathy or thyromegaly appreciated. Cor: PMI nondisplaced. Regular rate & rhythm. No rubs, gallops or murmurs. Lungs: clear Abdomen: soft, nontender, nondistended. No hepatosplenomegaly. No bruits or masses. Good bowel sounds. Extremities: no cyanosis, clubbing, rash, edema  Neuro: alert & oriented x 3, cranial nerves grossly intact. moves all 4 extremities w/o difficulty. Affect pleasant.   EKG: A-sensed V-paced 61 bpm  Device interrogation: AT/AF burden 0%, PVCs <1%, BP >99%  ASSESSMENT & PLAN:  1. Chronic systolic HF - Echo 26/71 EF 20-25%. LV dilated (6.5cm) with severe central MR. Global HK with ? AK of anteroseptum  - RHC/LHC 11/22 with minimal obstructive CAD, low filling pressure, normal output and severe NICM - cMRI LVEF 14%, diffuse HK, mild/mod RV dilation w/ severe systolic dysfunction, RVEF 15%, small nonspecific basal inferoseptal RV insertion site LGE, mild elevation in ECV, not suggestive of cardiac amyloidosis. - Echo (3/23): EF 25-30% LV markedly dilated. Severe dyssynchrony. RV normal. 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 - Continue lasix 20 mg daily - Stop digoxin 0.0625 mg daily. - Continue spiro 25 mg daily.   - Continue Farxiga 10 mg daily.  - Currently not taking his statin for fear of SE's - Underwent CRT-D 6/23 - Schedule echo, if no improvement post CRT-D will consider CPX - CMET, TSH, BNP   2. Frequent PVCs - Decrease amiodarone to 100 mg daily (LFTs at baseline  recent check).  -  keep K> 4.0 Mg > 2.0 - No longer wearing LifeVest. - Sleep study normal   3. Severe mitral regurgitation - Functional - Mild to moderate MR on echo 3/23. - Should improve with treatment of HF and possibly CRT-D (6/23)   4. COPD - Quit smoking 2009.   5. Pre-Diabetes - A1C 6.0 - On Farxiga.   6. H/o prostate CA - s/p radioactive seeds   7. Snoring - Home sleep study.12/22 AHI 0.6/hr   Earnie Larsson AGACNP-BC  10:02 AM  Patient seen and examined with the above-signed Advanced Practice Provider and/or Housestaff. I personally reviewed laboratory data, imaging studies and relevant notes. I independently examined the patient and formulated the important aspects of the plan. I have edited the note to reflect any of my changes or salient points. I have personally discussed the plan with the patient and/or family.  Now s/p CRT. Says he feels ok but not improving as quickly as he would like. Can do ADLs but SOB with hills or stairs. No edema, orthopnea or PND.   Device interrogation with >99% bivpacing. Volume ok. PVCs < 1%  General:  Well appearing. No resp difficulty HEENT: normal Neck: supple. no JVD. Carotids 2+ bilat; no bruits. No lymphadenopathy or thryomegaly appreciated. Cor: PMI nondisplaced. Regular rate & rhythm. No rubs, gallops or murmurs. Lungs: decreased throughout Abdomen: soft, nontender, nondistended. No hepatosplenomegaly. No bruits or masses. Good bowel sounds. Extremities: no cyanosis, clubbing, rash, edema Neuro: alert & orientedx3, cranial nerves grossly intact. moves all 4 extremities w/o difficulty. Affect pleasant  Now s/p CRT but still with NYHA II-III symptoms. Will repeat echo and get CPX. Med changes as above.   Glori Bickers, MD  2:10 PM

## 2022-08-06 LAB — T3, FREE: T3, Free: 2.6 pg/mL (ref 2.0–4.4)

## 2022-08-08 ENCOUNTER — Encounter: Payer: Self-pay | Admitting: Urology

## 2022-08-08 ENCOUNTER — Ambulatory Visit (HOSPITAL_COMMUNITY): Payer: 59 | Attending: Internal Medicine

## 2022-08-08 DIAGNOSIS — I5022 Chronic systolic (congestive) heart failure: Secondary | ICD-10-CM | POA: Diagnosis not present

## 2022-08-08 DIAGNOSIS — I447 Left bundle-branch block, unspecified: Secondary | ICD-10-CM | POA: Diagnosis not present

## 2022-08-08 DIAGNOSIS — I428 Other cardiomyopathies: Secondary | ICD-10-CM

## 2022-08-08 LAB — ECHOCARDIOGRAM COMPLETE
Area-P 1/2: 2.54 cm2
S' Lateral: 4.2 cm

## 2022-08-08 MED ORDER — PERFLUTREN LIPID MICROSPHERE
1.0000 mL | INTRAVENOUS | Status: AC | PRN
  Administered 2022-08-08: 2 mL via INTRAVENOUS

## 2022-08-08 NOTE — Telephone Encounter (Signed)
MD sent letter to patient addressing patient concerns.

## 2022-08-11 ENCOUNTER — Encounter: Payer: Self-pay | Admitting: Urology

## 2022-08-12 ENCOUNTER — Encounter: Payer: Self-pay | Admitting: Urology

## 2022-08-23 ENCOUNTER — Other Ambulatory Visit (HOSPITAL_COMMUNITY): Payer: Self-pay | Admitting: Adult Health

## 2022-08-24 IMAGING — CT CT CHEST LCS NODULE FOLLOW-UP W/O CM
1 of 2 series · 10 of 20 positions shown, 13 images · non-contrast
Comparison: 09/07/2021 and 08/08/2021.

CLINICAL DATA: Lung nodules, abnormal lung cancer screening CT.

EXAM:
CT CHEST WITHOUT CONTRAST FOR LUNG CANCER SCREENING NODULE FOLLOW-UP
TECHNIQUE: Multidetector CT imaging of the chest was performed following the
standard protocol without IV contrast.
RADIATION DOSE REDUCTION: This exam was performed according to the
departmental dose-optimization program which includes automated
exposure control, adjustment of the mA and/or kV according to
patient size and/or use of iterative reconstruction technique.

[ct lung segmentation data · axial · 0.71mm/px · z∈[+1143,+1143]mm · 10 of 331 frames shown]
[frame 1/331  mediastinal]
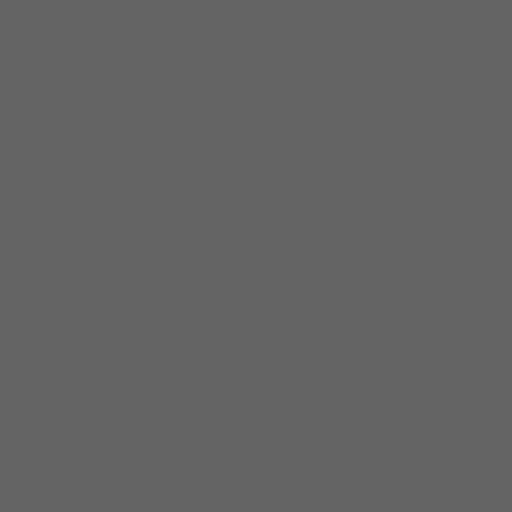
[frame 1/331  lung]
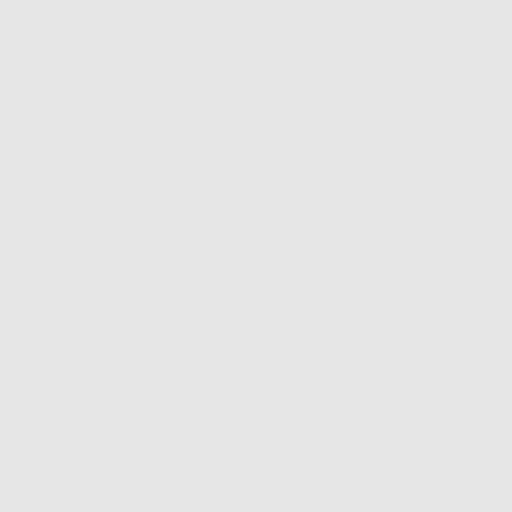
[frame 37/331  lung]
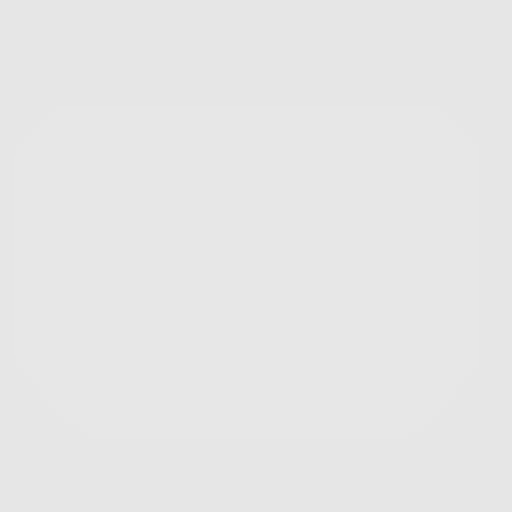
[frame 74/331  lung]
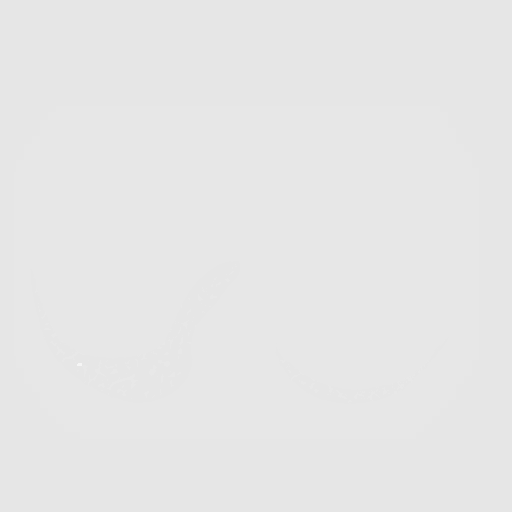
[frame 111/331  lung]
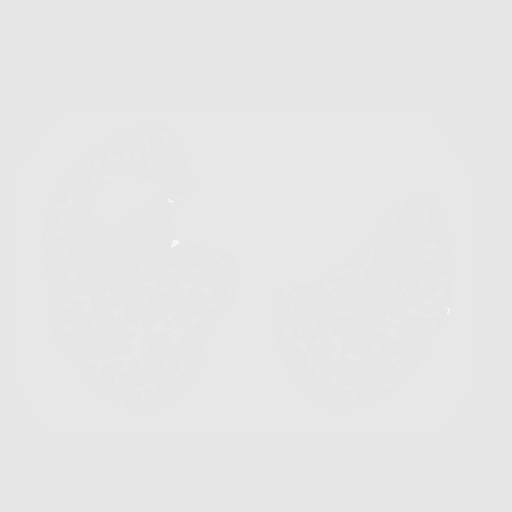
[frame 147/331  mediastinal]
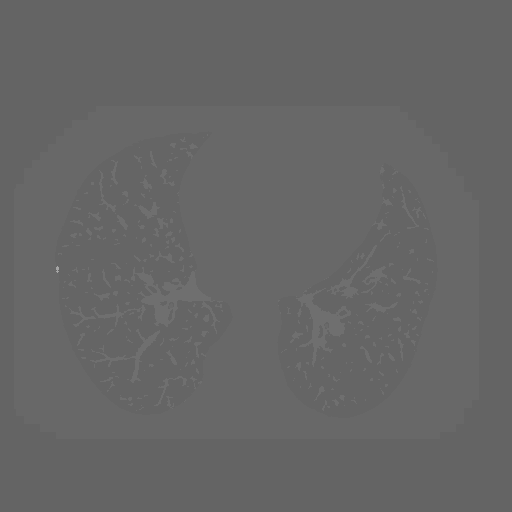
[frame 147/331  lung]
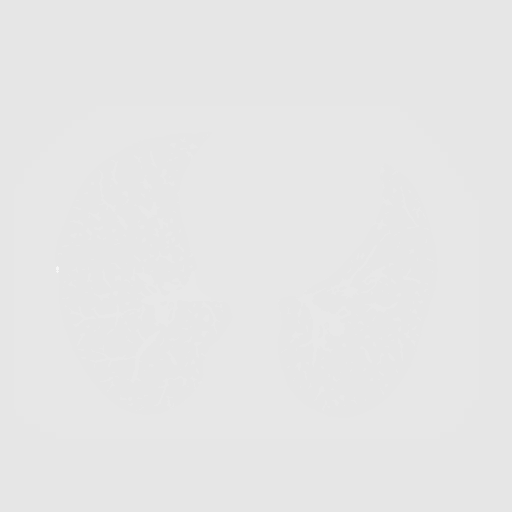
[frame 184/331  lung]
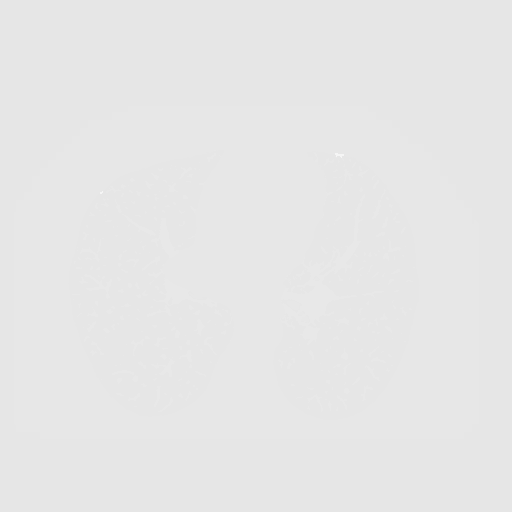
[frame 221/331  lung]
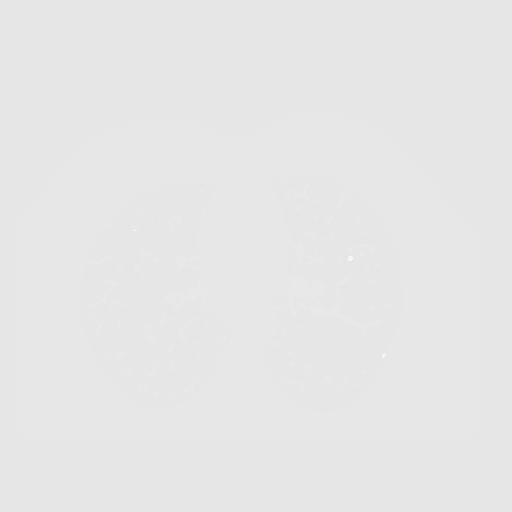
[frame 257/331  lung]
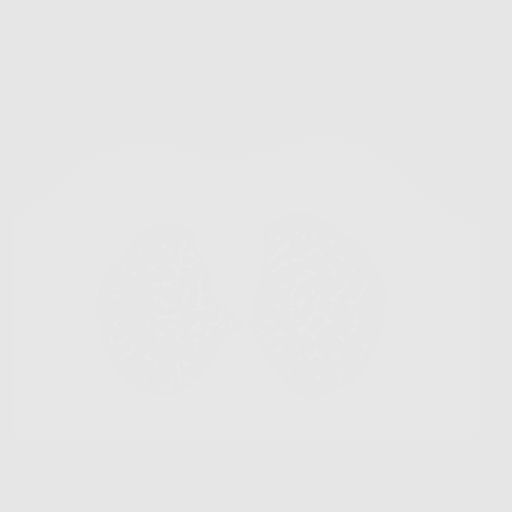
[frame 294/331  mediastinal]
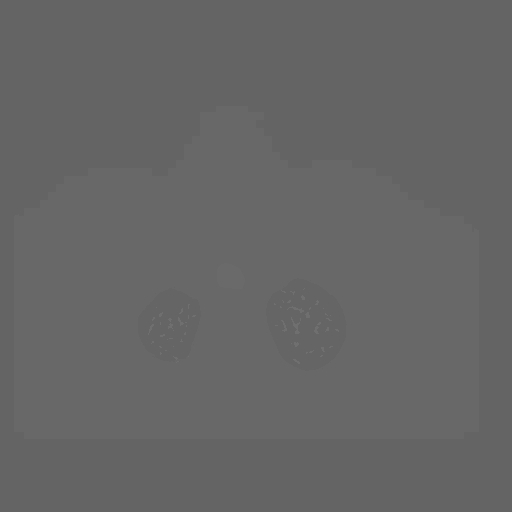
[frame 294/331  lung]
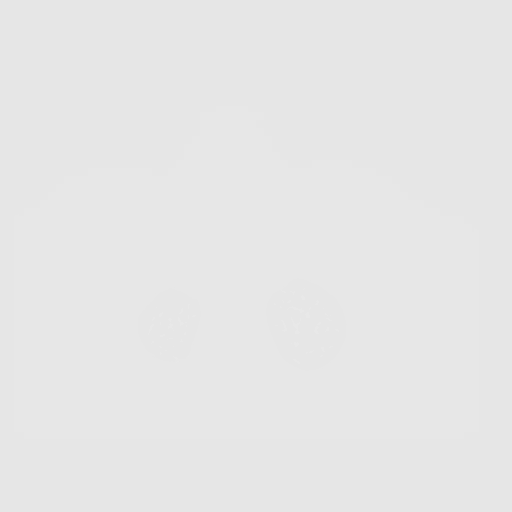
[frame 331/331  lung]
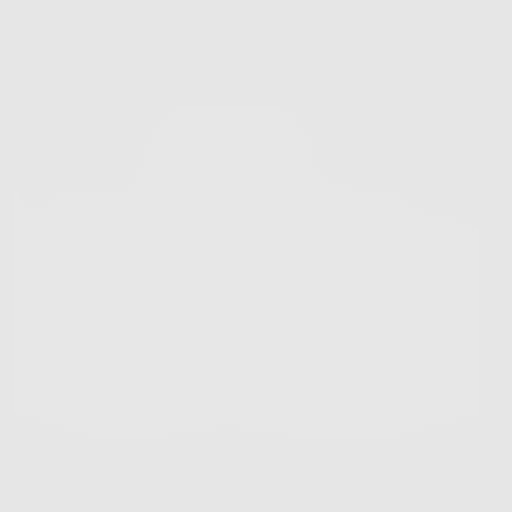

[10 of 20 positions shown; findings below may reference images not displayed]

FINDINGS: Cardiovascular: Heart is enlarged.  No pericardial effusion.

Mediastinum/Nodes: Mediastinal lymph nodes measure up to 9 mm in the
AP window. Hilar regions are difficult to definitively evaluate
without IV contrast. No axillary adenopathy. Esophagus is grossly
unremarkable.

Lungs/Pleura: Centrilobular and paraseptal emphysema. Mild basilar
predominant subpleural reticulation. Pulmonary nodules measure
mm or less in size, as before. No suspicious pulmonary nodules. No
pleural fluid. Airway is unremarkable.

Upper Abdomen: Visualized portions of the liver, gallbladder,
adrenal glands, kidneys, spleen, pancreas, stomach and bowel are
grossly unremarkable.

Musculoskeletal: Degenerative changes in the spine and shoulders.
Mild L1 anterior wedging, unchanged. No worrisome lytic or sclerotic
lesions.
IMPRESSION: 1. Lung-RADS 2, benign appearance or behavior. Continue annual
screening with low-dose chest CT without contrast in 12 months.
2. Mild basilar predominant subpleural reticulation raises suspicion
for usual interstitial pneumonitis or nonspecific interstitial
pneumonitis. If further evaluation is desired, high-resolution chest
CT without contrast is recommended.
3.  Emphysema ([id number].3).

## 2022-08-26 ENCOUNTER — Telehealth: Payer: Self-pay

## 2022-08-26 NOTE — Telephone Encounter (Signed)
Patient wanted to know if here was a way to see if his insurance would approve the Gemtesa '75mg'$ . He advised the last medication was out of his price range. He picked up the samples today for Gemtesa '75mg'$ . I advised patient I would check to see if we had to send in an RX to see if covered or if we could submit a request through the insurance to verify.

## 2022-08-27 MED ORDER — GEMTESA 75 MG PO TABS: 75.0000 mg | ORAL_TABLET | Freq: Every day | ORAL | 11 refills | Status: DC

## 2022-08-27 NOTE — Telephone Encounter (Signed)
Gemtesa Rx sent to pharmacy with 11 refills.

## 2022-08-30 DIAGNOSIS — I493 Ventricular premature depolarization: Secondary | ICD-10-CM | POA: Diagnosis not present

## 2022-08-30 DIAGNOSIS — J439 Emphysema, unspecified: Secondary | ICD-10-CM | POA: Diagnosis not present

## 2022-08-30 DIAGNOSIS — R911 Solitary pulmonary nodule: Secondary | ICD-10-CM | POA: Diagnosis not present

## 2022-08-30 DIAGNOSIS — R030 Elevated blood-pressure reading, without diagnosis of hypertension: Secondary | ICD-10-CM | POA: Diagnosis not present

## 2022-08-30 DIAGNOSIS — Z6826 Body mass index (BMI) 26.0-26.9, adult: Secondary | ICD-10-CM | POA: Diagnosis not present

## 2022-08-30 DIAGNOSIS — C61 Malignant neoplasm of prostate: Secondary | ICD-10-CM | POA: Diagnosis not present

## 2022-08-30 DIAGNOSIS — I5020 Unspecified systolic (congestive) heart failure: Secondary | ICD-10-CM | POA: Diagnosis not present

## 2022-08-30 DIAGNOSIS — I429 Cardiomyopathy, unspecified: Secondary | ICD-10-CM | POA: Diagnosis not present

## 2022-09-19 ENCOUNTER — Emergency Department (HOSPITAL_COMMUNITY)
Admission: EM | Admit: 2022-09-19 | Discharge: 2022-09-19 | Disposition: A | Discharge status: Home / Self Care | Payer: 59 | Attending: Emergency Medicine | Admitting: Emergency Medicine

## 2022-09-19 ENCOUNTER — Other Ambulatory Visit: Payer: Self-pay

## 2022-09-19 ENCOUNTER — Encounter (HOSPITAL_COMMUNITY): Payer: Self-pay | Admitting: Emergency Medicine

## 2022-09-19 ENCOUNTER — Encounter (HOSPITAL_COMMUNITY): Payer: Self-pay | Admitting: Physical Therapy

## 2022-09-19 DIAGNOSIS — R030 Elevated blood-pressure reading, without diagnosis of hypertension: Secondary | ICD-10-CM | POA: Diagnosis not present

## 2022-09-19 DIAGNOSIS — Z6825 Body mass index (BMI) 25.0-25.9, adult: Secondary | ICD-10-CM | POA: Diagnosis not present

## 2022-09-19 DIAGNOSIS — I110 Hypertensive heart disease with heart failure: Secondary | ICD-10-CM | POA: Diagnosis not present

## 2022-09-19 DIAGNOSIS — Z79899 Other long term (current) drug therapy: Secondary | ICD-10-CM | POA: Insufficient documentation

## 2022-09-19 DIAGNOSIS — R1032 Left lower quadrant pain: Secondary | ICD-10-CM | POA: Insufficient documentation

## 2022-09-19 DIAGNOSIS — K4090 Unilateral inguinal hernia, without obstruction or gangrene, not specified as recurrent: Secondary | ICD-10-CM | POA: Diagnosis not present

## 2022-09-19 DIAGNOSIS — K219 Gastro-esophageal reflux disease without esophagitis: Secondary | ICD-10-CM | POA: Insufficient documentation

## 2022-09-19 DIAGNOSIS — Z8546 Personal history of malignant neoplasm of prostate: Secondary | ICD-10-CM | POA: Insufficient documentation

## 2022-09-19 DIAGNOSIS — I509 Heart failure, unspecified: Secondary | ICD-10-CM | POA: Insufficient documentation

## 2022-09-19 LAB — CBC
HCT: 47.1 % (ref 39.0–52.0)
Hemoglobin: 15.1 g/dL (ref 13.0–17.0)
MCH: 28.9 pg (ref 26.0–34.0)
MCHC: 32.1 g/dL (ref 30.0–36.0)
MCV: 90.1 fL (ref 80.0–100.0)
Platelets: 313 10*3/uL (ref 150–400)
RBC: 5.23 MIL/uL (ref 4.22–5.81)
RDW: 13.5 % (ref 11.5–15.5)
WBC: 13.4 10*3/uL — ABNORMAL HIGH (ref 4.0–10.5)
nRBC: 0 % (ref 0.0–0.2)

## 2022-09-19 LAB — COMPREHENSIVE METABOLIC PANEL
ALT: 20 U/L (ref 0–44)
AST: 23 U/L (ref 15–41)
Albumin: 4.1 g/dL (ref 3.5–5.0)
Alkaline Phosphatase: 57 U/L (ref 38–126)
Anion gap: 10 (ref 5–15)
BUN: 14 mg/dL (ref 6–20)
CO2: 23 mmol/L (ref 22–32)
Calcium: 9.4 mg/dL (ref 8.9–10.3)
Chloride: 103 mmol/L (ref 98–111)
Creatinine, Ser: 0.86 mg/dL (ref 0.61–1.24)
GFR, Estimated: >60 mL/min (ref >60)
Glucose, Bld: 88 mg/dL (ref 70–99)
Potassium: 3.9 mmol/L (ref 3.5–5.1)
Sodium: 136 mmol/L (ref 135–145)
Total Bilirubin: 0.7 mg/dL (ref 0.3–1.2)
Total Protein: 7.1 g/dL (ref 6.5–8.1)

## 2022-09-19 LAB — URINALYSIS, ROUTINE W REFLEX MICROSCOPIC
Bacteria, UA: NONE SEEN
Bilirubin Urine: NEGATIVE
Glucose, UA: >=500 mg/dL — AB
Hgb urine dipstick: NEGATIVE
Ketones, ur: 5 mg/dL — AB
Leukocytes,Ua: NEGATIVE
Nitrite: NEGATIVE
Protein, ur: NEGATIVE mg/dL
Specific Gravity, Urine: 1.011 (ref 1.005–1.030)
pH: 5 (ref 5.0–8.0)

## 2022-09-19 LAB — LIPASE, BLOOD: Lipase: 37 U/L (ref 11–51)

## 2022-09-19 NOTE — Therapy (Signed)
[place], [place], [address] Phone: [phone]   Fax:  [phone]  Patient Details  Name: Ruben Duffy MRN: [id number] Date of Birth: 1966/10/14 Referring Provider:  No ref. provider found  Encounter Date: 09/19/2022  PHYSICAL THERAPY DISCHARGE SUMMARY  Visits from Start of Care: 3  Current functional level related to goals  NA  functional outcomes: NA  Education / Equipment: Patient DC per self request   Patient agrees to discharge. Patient goals were not met. Patient is being discharged due to the patient's request.   Elizbeth Squires, PT 09/19/2022, 11:30 AM  [place], [place], [address] Phone: [phone]   Fax:  [phone]

## 2022-09-19 NOTE — ED Triage Notes (Signed)
Pt sent from [facility] in [place], [place] for r/o LLQ hernia, pt has been having pain a couple days.

## 2022-09-19 NOTE — ED Provider Notes (Signed)
[facility] EMERGENCY DEPARTMENT Provider Note   CSN: [id number] Arrival date & time: 09/19/22  1705      History  Chief Complaint  Patient presents with   Abdominal Pain    Ruben Duffy is a 56 y.o. male.  HPI     This is a 56 year old male who presents with left lower quadrant pain.  Patient describes noting a knot in his left groin area.  He noted it several days ago and it went away.  However he woke up this morning and had persistent bulging in the left groin.  He states that it was painful.  He has not had any nausea or vomiting.  He has had normal bowel movements.  He states that since coming to the emergency department the bulge has gone away.  He has not noted any masses or bulges in his scrotum.  Denies any heavy lifting or injury.  Home Medications Prior to Admission medications   Medication Sig Start Date End Date Taking? Authorizing Provider  amiodarone (PACERONE) 200 MG tablet Take 0.5 tablets (100 mg total) by mouth daily. 08/05/22   Bensimhon, Shaune Pascal, MD  atorvastatin (LIPITOR) 40 MG tablet Take 1 tablet (40 mg total) by mouth daily. Patient not taking: Reported on 08/05/2022 04/05/22   Rafael Bihari, FNP  carvedilol (COREG) 3.125 MG tablet Take 1 tablet (3.125 mg total) by mouth 2 (two) times daily with a meal. 04/05/22   Milford, Maricela Bo, FNP  cyclobenzaprine (FLEXERIL) 10 MG tablet SMARTSIG:1 Tablet(s) By Mouth Every Evening PRN 07/07/22   [provider]  dapagliflozin propanediol (FARXIGA) 10 MG TABS tablet Take 1 tablet (10 mg total) by mouth daily. 09/19/21   Rafael Bihari, FNP  DULoxetine (CYMBALTA) 60 MG capsule Take 60 mg by mouth daily. 12/25/20   [provider]  furosemide (LASIX) 20 MG tablet Take 20 mg by mouth daily.    [provider]  MAGNESIUM-OXIDE 400 (240 Mg) MG tablet Take 1/2 (one-half) tablet by mouth once daily 08/23/22   Bensimhon, Shaune Pascal, MD  Multiple Vitamin (MULTIVITAMIN ADULT PO) Take by mouth.     [provider]  omeprazole (PRILOSEC) 40 MG capsule Take 40 mg by mouth daily. 12/01/20   [provider]  potassium chloride SA (KLOR-CON M) 20 MEQ tablet Take 1 tablet by mouth once daily 07/08/22   Bensimhon, Shaune Pascal, MD  sacubitril-valsartan (ENTRESTO) 24-26 MG Take 1 tablet by mouth 2 (two) times daily. 04/05/22   Rafael Bihari, FNP  sildenafil (REVATIO) 20 MG tablet TAKE 1- 5 TABLETS BY MOUTH AS NEEDED 07/07/22   Irine Seal, MD  silodosin (RAPAFLO) 8 MG CAPS capsule Take 1 capsule (8 mg total) by mouth daily with breakfast. 01/10/22   Irine Seal, MD  spironolactone (ALDACTONE) 25 MG tablet Take 1 tablet (25 mg total) by mouth daily. 04/05/22   Milford, Maricela Bo, FNP  traMADol (ULTRAM) 50 MG tablet Take 50 mg by mouth 2 (two) times daily as needed. 02/22/22   [provider]  triamcinolone (KENALOG) 0.025 % ointment Apply topically daily as needed (itching, rash). 09/22/20   [provider]  valACYclovir (VALTREX) 500 MG tablet Take 500 mg by mouth 2 (two) times daily as needed. 01/18/21   [provider]  Vibegron (GEMTESA) 75 MG TABS Take 75 mg by mouth daily. 08/27/22   Irine Seal, MD      Allergies    Patient has no known allergies.    Review of Systems  Review of Systems  Constitutional:  Negative for fever.  Gastrointestinal:  Positive for abdominal pain.  All other systems reviewed and are negative.   Physical Exam Updated Vital Signs BP 122/75   Pulse 67   Temp 97.6 F (36.4 C) (Oral)   Resp 16   Ht 1.727 m ('5\' 8"'$ )   Wt 77.6 kg   SpO2 97%   BMI 26.00 kg/m  Physical Exam Vitals and nursing note reviewed. Exam conducted with a chaperone present.  Constitutional:      Appearance: He is well-developed. He is not ill-appearing.  HENT:     Head: Normocephalic and atraumatic.  Eyes:     Pupils: Pupils are equal, round, and reactive to light.  Cardiovascular:     Rate and Rhythm: Normal rate and regular rhythm.   Pulmonary:     Effort: Pulmonary effort is normal. No respiratory distress.  Abdominal:     General: Bowel sounds are normal.     Palpations: Abdomen is soft.     Tenderness: There is no abdominal tenderness. There is no guarding or rebound.     Hernia: No hernia is present.     Comments: No palpable hernia noted in the inguinal region, slight tenderness to palpation  Musculoskeletal:     Cervical back: Neck supple.  Lymphadenopathy:     Cervical: No cervical adenopathy.  Skin:    General: Skin is warm and dry.  Neurological:     Mental Status: He is alert and oriented to person, place, and time.  Psychiatric:        Mood and Affect: Mood normal.     ED Results / Procedures / Treatments   Labs (all labs ordered are listed, but only abnormal results are displayed) Labs Reviewed  CBC - Abnormal; Notable for the following components:      Result Value   WBC 13.4 (*)    All other components within normal limits  URINALYSIS, ROUTINE W REFLEX MICROSCOPIC - Abnormal; Notable for the following components:   Glucose, UA >=500 (*)    Ketones, ur 5 (*)    All other components within normal limits  LIPASE, BLOOD  COMPREHENSIVE METABOLIC PANEL    EKG None  Radiology No results found.  Procedures Procedures    Medications Ordered in ED Medications - No data to display  ED Course/ Medical Decision Making/ A&P                           Medical Decision Making Amount and/or Complexity of Data Reviewed Labs: ordered.   This patient presents to the ED for concern of left lower quadrant, inguinal pain, this involves an extensive number of treatment options, and is a complaint that carries with it a high risk of complications and morbidity.  I considered the following differential and admission for this acute, potentially life threatening condition.  The differential diagnosis includes hernia, lymphadenopathy, colitis  MDM:    This is a 56 year old male who presents with  left inguinal and left lower quadrant pain.  He is nontoxic.  Vital signs are reassuring.  Pain and bulge have since resolved.  He has slight tenderness in the left lower quadrant without a palpable hernia.  He has not had any obstructive symptoms including vomiting.  Labs obtained and reviewed.  Slight leukocytosis but otherwise no metabolic derangements.  We discussed the utility of imaging.  Given that there are no physical exam findings of strangulation or  incarceration, imaging likely has little utility tonight.  I do feel that he likely has an inguinal hernia that is just not present on my current exam.  Will have him follow-up with general surgery.  Patient is agreeable to plan.  He was given strict return precautions.  (Labs, imaging, consults)  Labs: I Ordered, and personally interpreted labs.  The pertinent results include: CBC, CMP, lipase, urinalysis  Imaging Studies ordered: I ordered imaging studies including none I independently visualized and interpreted imaging. I agree with the radiologist interpretation  Additional history obtained from chart review.  External records from outside source obtained and reviewed including prior evaluations  Cardiac Monitoring: The patient was maintained on a cardiac monitor.  I personally viewed and interpreted the cardiac monitored which showed an underlying rhythm of: Paced rhythm  Reevaluation: After the interventions noted above, I reevaluated the patient and found that they have :improved  Social Determinants of Health:  lives independently  Disposition: Discharge  Co morbidities that complicate the patient evaluation  Past Medical History:  Diagnosis Date   Arthritis    CHF (congestive heart failure) ([place])    Emphysema of lung ([place])    GERD (gastroesophageal reflux disease)    Hypertension    Hypospadias, [place]    chronic   LBBB (left bundle branch block)    newly dx by ekg done 04-26-2021 for surgery on 06-05-2021,  pt  stated he not had a previous ekg before, so pt sent for cardiac clearance prior to surgery;  cardiologist , dr berry, office note clearance in epic 05-09-2021 gave pt clearance with no further work-up needed   Lower urinary tract symptoms (LUTS)    Prostate cancer [facility])    urologist-- dr Jeffie Pollock--- dx by bx 01-08-2021, Gleason 3+4, T2a   Substance abuse ([place])      Medicines No orders of the defined types were placed in this encounter.   I have reviewed the patients home medicines and have made adjustments as needed  Problem List / ED Course: Problem List Items Addressed This Visit   None Visit Diagnoses     LLQ pain    -  Primary                   Final Clinical Impression(s) / ED Diagnoses Final diagnoses:  LLQ pain    Rx / DC Orders ED Discharge Orders     None         Merryl Hacker, MD 09/19/22 2342

## 2022-09-19 NOTE — ED Notes (Signed)
ED Provider at bedside. 

## 2022-09-19 NOTE — Discharge Instructions (Signed)
You were seen today with abdominal pain.  Given your history, I highly suspect you may have an inguinal hernia.  Currently there does not appear to be a palpable hernia on your physical exam; however, given your history, this seems consistent.  Your workup here today is reassuring.  If you note bulging with redness, vomiting, difficulty having bowel movement, or bulging that will not reduce, you need to be reevaluated immediately.  Otherwise follow-up with general surgery.

## 2022-09-27 ENCOUNTER — Encounter: Payer: Self-pay | Admitting: Urology

## 2022-09-28 ENCOUNTER — Other Ambulatory Visit (HOSPITAL_COMMUNITY): Payer: Self-pay | Admitting: Adult Health

## 2022-10-02 ENCOUNTER — Other Ambulatory Visit: Payer: Self-pay | Admitting: Urology

## 2022-10-03 ENCOUNTER — Telehealth: Payer: Self-pay

## 2022-10-03 ENCOUNTER — Encounter: Payer: Self-pay | Admitting: Urology

## 2022-10-03 ENCOUNTER — Other Ambulatory Visit: Payer: 59

## 2022-10-03 ENCOUNTER — Ambulatory Visit (INDEPENDENT_AMBULATORY_CARE_PROVIDER_SITE_OTHER): Payer: 59 | Admitting: Urology

## 2022-10-03 VITALS — BP 158/77 | HR 72

## 2022-10-03 DIAGNOSIS — R1032 Left lower quadrant pain: Secondary | ICD-10-CM

## 2022-10-03 DIAGNOSIS — N3941 Urge incontinence: Secondary | ICD-10-CM

## 2022-10-03 DIAGNOSIS — N3281 Overactive bladder: Secondary | ICD-10-CM

## 2022-10-03 DIAGNOSIS — K4090 Unilateral inguinal hernia, without obstruction or gangrene, not specified as recurrent: Secondary | ICD-10-CM | POA: Diagnosis not present

## 2022-10-03 DIAGNOSIS — Z8546 Personal history of malignant neoplasm of prostate: Secondary | ICD-10-CM

## 2022-10-03 DIAGNOSIS — C61 Malignant neoplasm of prostate: Secondary | ICD-10-CM | POA: Diagnosis not present

## 2022-10-03 LAB — URINALYSIS, ROUTINE W REFLEX MICROSCOPIC
Bilirubin, UA: NEGATIVE
Glucose, UA: NEGATIVE
Ketones, UA: NEGATIVE
Leukocytes,UA: NEGATIVE
Nitrite, UA: NEGATIVE
Protein,UA: NEGATIVE
RBC, UA: NEGATIVE
Specific Gravity, UA: 1.015 (ref 1.005–1.030)
Urobilinogen, Ur: 0.2 mg/dL (ref 0.2–1.0)
pH, UA: 7 (ref 5.0–7.5)

## 2022-10-03 MED ORDER — SILDENAFIL CITRATE 20 MG PO TABS: ORAL_TABLET | ORAL | 11 refills | Status: DC

## 2022-10-03 MED ORDER — GEMTESA 75 MG PO TABS: 75.0000 mg | ORAL_TABLET | Freq: Every day | ORAL | 0 refills | Status: DC

## 2022-10-03 MED ORDER — SILODOSIN 8 MG PO CAPS: 8.0000 mg | ORAL_CAPSULE | Freq: Every day | ORAL | 11 refills | Status: DC

## 2022-10-03 NOTE — Telephone Encounter (Signed)
Made patient aware that Dr. Jeffie Pollock can see him today for groin pain, patient voiced understanding.

## 2022-10-03 NOTE — Progress Notes (Signed)
Subjective:  1. Left inguinal pain   2. Inguinal hernia, left   3. History of prostate cancer   4. Overactive bladder   5. Urge incontinence     Ruben Duffy presents today with a recent history of left inguinal pain.  He was seen in the ER with the complaint of pain and an inguinal bulge, but the bulge had resolved on exam along with the pain when he was seen.   He is back with pain and can feel gas bubble through the area and the pain is relieved when he passes gas.    IPSS     Row Name 10/03/22 1100         International Prostate Symptom Score   How often have you had the sensation of not emptying your bladder? Less than half the time     How often have you had to urinate less than every two hours? More than half the time     How often have you found you stopped and started again several times when you urinated? Less than 1 in 5 times     How often have you found it difficult to postpone urination? More than half the time     How often have you had a weak urinary stream? Less than half the time     How often have you had to strain to start urination? Not at All     How many times did you typically get up at night to urinate? 3 Times     Total IPSS Score 16       Quality of Life due to urinary symptoms   If you were to spend the rest of your life with your urinary condition just the way it is now how would you feel about that? Unhappy               ROS:  ROS:  A complete review of systems was performed.  All systems are negative except for pertinent findings as noted.   Review of Systems  Constitutional:  Positive for malaise/fatigue.  Gastrointestinal:  Positive for constipation.  Musculoskeletal:  Positive for back pain and joint pain.  Neurological:  Positive for headaches.  Endo/Heme/Allergies:  Positive for polydipsia.  Psychiatric/Behavioral:  Positive for memory loss.     No Known Allergies  Outpatient Encounter Medications as of 10/03/2022  Medication Sig    amiodarone (PACERONE) 200 MG tablet Take 0.5 tablets (100 mg total) by mouth daily.   atorvastatin (LIPITOR) 40 MG tablet Take 1 tablet (40 mg total) by mouth daily.   carvedilol (COREG) 3.125 MG tablet Take 1 tablet (3.125 mg total) by mouth 2 (two) times daily with a meal.   cyclobenzaprine (FLEXERIL) 10 MG tablet SMARTSIG:1 Tablet(s) By Mouth Every Evening PRN   DULoxetine (CYMBALTA) 60 MG capsule Take 60 mg by mouth daily.   FARXIGA 10 MG TABS tablet Take 1 tablet by mouth once daily   furosemide (LASIX) 20 MG tablet Take 20 mg by mouth daily.   MAGNESIUM-OXIDE 400 (240 Mg) MG tablet Take 1/2 (one-half) tablet by mouth once daily   Multiple Vitamin (MULTIVITAMIN ADULT PO) Take by mouth.   omeprazole (PRILOSEC) 40 MG capsule Take 40 mg by mouth daily.   potassium chloride SA (KLOR-CON M) 20 MEQ tablet Take 1 tablet by mouth once daily   sacubitril-valsartan (ENTRESTO) 24-26 MG Take 1 tablet by mouth 2 (two) times daily.   spironolactone (ALDACTONE) 25 MG tablet Take 1 tablet (25  mg total) by mouth daily.   traMADol (ULTRAM) 50 MG tablet Take 50 mg by mouth 2 (two) times daily as needed.   triamcinolone (KENALOG) 0.025 % ointment Apply topically daily as needed (itching, rash).   valACYclovir (VALTREX) 500 MG tablet Take 500 mg by mouth 2 (two) times daily as needed.   Vibegron (GEMTESA) 75 MG TABS Take 75 mg by mouth daily.   [DISCONTINUED] sildenafil (REVATIO) 20 MG tablet TAKE 1 TO 5 TABLETS BY MOUTH AS NEEDED   [DISCONTINUED] silodosin (RAPAFLO) 8 MG CAPS capsule Take 1 capsule (8 mg total) by mouth daily with breakfast.   sildenafil (REVATIO) 20 MG tablet TAKE 1 TO 5 TABLETS BY MOUTH AS NEEDED   silodosin (RAPAFLO) 8 MG CAPS capsule Take 1 capsule (8 mg total) by mouth daily with breakfast.   [DISCONTINUED] Vibegron (GEMTESA) 75 MG TABS Take 75 mg by mouth daily.   No facility-administered encounter medications on file as of 10/03/2022.    Past Medical History:  Diagnosis Date    Arthritis    CHF (congestive heart failure) (HCC)    Emphysema of lung (HCC)    GERD (gastroesophageal reflux disease)    Hypertension    Hypospadias, balanic    chronic   LBBB (left bundle branch block)    newly dx by ekg done 04-26-2021 for surgery on 06-05-2021,  pt stated he not had a previous ekg before, so pt sent for cardiac clearance prior to surgery;  cardiologist , dr berry, office note clearance in epic 05-09-2021 gave pt clearance with no further work-up needed   Lower urinary tract symptoms (LUTS)    Prostate cancer [facility])    urologist-- dr Jeffie Pollock--- dx by bx 01-08-2021, Gleason 3+4, T2a   Substance abuse [facility])     Past Surgical History:  Procedure Laterality Date   BIOPSY PROSTATE     BIV ICD INSERTION CRT-D N/A 04/08/2022   Procedure: BIV ICD INSERTION CRT-D;  Surgeon: Deboraha Sprang, MD;  Location: [place] [facility];  Service: Cardiovascular;  Laterality: N/A;   CIRCUMCISION N/A 06/05/2021   Procedure: CIRCUMCISION ADULT;  Surgeon: Irine Seal, MD;  Location: [facility];  Service: Urology;  Laterality: N/A;   HEMORRHOID SURGERY     HYPOSPADIAS CORRECTION     RADIOACTIVE SEED IMPLANT N/A 06/05/2021   Procedure: RADIOACTIVE SEED IMPLANT/BRACHYTHERAPY IMPLANT;  Surgeon: Irine Seal, MD;  Location: [facility];  Service: Urology;  Laterality: N/A;   RIGHT/LEFT HEART CATH AND CORONARY ANGIOGRAPHY N/A 09/10/2021   Procedure: RIGHT/LEFT HEART CATH AND CORONARY ANGIOGRAPHY;  Surgeon: Jolaine Artist, MD;  Location: [place] [facility];  Service: Cardiovascular;  Laterality: N/A;   SPACE OAR INSTILLATION N/A 06/05/2021   Procedure: SPACE OAR INSTILLATION;  Surgeon: Irine Seal, MD;  Location: [facility];  Service: Urology;  Laterality: N/A;    Social History   Socioeconomic History   Marital status: Single    Spouse name: Not on file   Number of children: 1   Years of education: Not on file   Highest education level: Not on  file  Occupational History   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  Tobacco Use   Smoking status: Former    Packs/day: 1.50    Years: 30.00    Total pack years: 45.00    Types: Cigarettes    Quit date: 2009    Years since quitting: 14.9   Smokeless tobacco: Never  Vaping Use  Vaping Use: Never used  Substance and Sexual Activity   Alcohol use: Not Currently   Drug use: Not Currently    Types: Marijuana, "Crack" cocaine   Sexual activity: Yes  Other Topics Concern   Not on file  Social History Narrative   Not on file   Social Determinants of Health   Financial Resource Strain: Low Risk  (07/27/2021)   Overall Financial Resource Strain (CARDIA)    Difficulty of Paying Living Expenses: Not hard at all  Food Insecurity: No Food Insecurity (07/27/2021)   Hunger Vital Sign    Worried About Running Out of Food in the Last Year: Never true    Ran Out of Food in the Last Year: Never true  Transportation Needs: No Transportation Needs (07/27/2021)   PRAPARE - Hydrologist (Medical): No    Lack of Transportation (Non-Medical): No  Physical Activity: Inactive (07/27/2021)   Exercise Vital Sign    Days of Exercise per Week: 0 days    Minutes of Exercise per Session: 0 min  Stress: No Stress Concern Present (07/27/2021)   [place]    Feeling of Stress : Not at all  Social Connections: Socially Isolated (07/27/2021)   Social Connection and Isolation Panel [NHANES]    Frequency of Communication with Friends and Family: Three times a week    Frequency of Social Gatherings with Friends and Family: Three times a week    Attends Religious Services: Never    Active Member of Clubs or Organizations: No    Attends Archivist Meetings: Never    Marital Status: Never married  Intimate Partner Violence: Not At Risk (07/27/2021)   Humiliation, Afraid, Rape, and  Kick questionnaire    Fear of Current or Ex-Partner: No    Emotionally Abused: No    Physically Abused: No    Sexually Abused: No    Family History  Problem Relation Age of Onset   Stroke Father    Lung cancer Maternal Aunt    Diabetes Maternal Uncle    Brain cancer Maternal Uncle    Lung cancer Maternal Uncle    Lung cancer Maternal Uncle    Cancer Paternal Grandmother    Diabetes Paternal Grandfather    Colon cancer Neg Hx    Esophageal cancer Neg Hx    Stomach cancer Neg Hx    Pancreatic cancer Neg Hx    Colon polyps Neg Hx    Rectal cancer Neg Hx        Objective: Vitals:   10/03/22 1124  BP: (!) 158/77  Pulse: 72     Physical Exam Vitals reviewed.  Constitutional:      Appearance: Normal appearance.  Abdominal:     General: Abdomen is flat.     Palpations: Abdomen is soft.     Hernia: A hernia (left inguinal, slightly tender but no easily reducible.) is present.  Neurological:     Mental Status: He is alert.     Lab Results:  PSA No results found for: "PSA" No results found for: "TESTOSTERONE"    Studies/Results: No results found. Recent Results (from the past 2160 hour(s))  CUP PACEART REMOTE DEVICE CHECK     Status: None   Collection Time: 07/08/22  2:00 AM  Result Value Ref Range   Date Time Interrogation Session [id number]    Pulse Generator Manufacturer [employer]    Pulse Gen Model 3369-40C Quadra Assura MP  Pulse Gen Serial Number [id number]    Clinic Name [facility]    Implantable Pulse Generator Type Cardiac Resynch Therapy Defibulator    Implantable Pulse Generator Implant Date [id number]    Implantable Lead Manufacturer [facility]    Implantable Lead Model 1458Q Quartet    Implantable Lead Serial Number [id number]    Implantable Lead Implant Date 88502774    Implantable Lead Location Detail 1 UNKNOWN    Implantable Lead Location [id number]    Implantable Lead Manufacturer [facility]    Implantable Lead Model 7122 Durata    Implantable Lead  Serial Number [id number]    Implantable Lead Implant Date [id number]    Implantable Lead Location Detail 1 UNKNOWN    Implantable Lead Location [id number]    Implantable Lead Manufacturer [facility]    Implantable Lead Model LPA1200M Tendril MRI    Implantable Lead Serial Number [id number]    Implantable Lead Implant Date 72094709    Implantable Lead Location Detail 1 UNKNOWN    Implantable Lead Location [phone]    Lead Channel Setting Sensing Sensitivity 0.5 mV   Lead Channel Setting Sensing Adaptation Mode Adaptive Sensing    Lead Channel Setting Pacing Amplitude 3.5 V   Lead Channel Setting Pacing Pulse Width 0.5 ms   Lead Channel Setting Pacing Amplitude 3.5 V   Lead Channel Setting Pacing Pulse Width 0.8 ms   Lead Channel Setting Pacing Amplitude 3.5 V   Lead Channel Setting Pacing Capture Mode Fixed Pacing    Lead Channel Status NULL    Lead Channel Impedance Value 1,125 ohm   Lead Channel Pacing Threshold Amplitude 1.5 V   Lead Channel Pacing Threshold Pulse Width 0.8 ms   Lead Channel Status NULL    Lead Channel Impedance Value 590 ohm   Lead Channel Sensing Intrinsic Amplitude 3.5 mV   Lead Channel Pacing Threshold Amplitude 0.75 V   Lead Channel Pacing Threshold Pulse Width 0.5 ms   Lead Channel Status NULL    Lead Channel Impedance Value 580 ohm   Lead Channel Sensing Intrinsic Amplitude 11.6 mV   Lead Channel Pacing Threshold Amplitude 0.5 V   Lead Channel Pacing Threshold Pulse Width 0.5 ms   HighPow Impedance 72 ohm   HighPow Impedance 72 ohm   HighPow Imped Status NULL    HighPow Imped Status NULL    Battery Status MOS    Battery Remaining Longevity 58 mo   Battery Remaining Percentage 94.0 %   Battery Voltage 3.19 V   Brady Statistic RA Percent Paced 1.2 %   Brady Statistic AP VP Percent 1.2 %   Brady Statistic AS VP Percent 99.0 %   Brady Statistic AP VS Percent 1.0 %   Brady Statistic AS VS Percent 1.0 %  Hepatic function panel     Status: None   Collection Time:  07/11/22  3:18 PM  Result Value Ref Range   Total Protein 6.5 6.0 - 8.5 g/dL   Albumin 4.2 3.8 - 4.9 g/dL   Bilirubin Total 0.4 0.0 - 1.2 mg/dL   Bilirubin, Direct 0.16 0.00 - 0.40 mg/dL   Alkaline Phosphatase 85 44 - 121 IU/L   AST 32 0 - 40 IU/L   ALT 32 0 - 44 IU/L  Digoxin level     Status: None   Collection Time: 07/11/22  3:18 PM  Result Value Ref Range   Digoxin, Serum 0.6 0.5 - 0.9 ng/mL    Comment: Concentrations above 2.0 ng/mL are generally considered toxic. Some overlap  of toxic and non-toxic values have been reported.                             Detection Limit = 0.4 ng/mL Therapeutic range is derived from 2013 ACCF/AHA Guidelines for the Management of Heart Failure.   Comprehensive Metabolic Panel (CMET)     Status: Abnormal   Collection Time: 08/05/22 11:25 AM  Result Value Ref Range   Sodium 135 135 - 145 mmol/L   Potassium 4.2 3.5 - 5.1 mmol/L   Chloride 99 98 - 111 mmol/L   CO2 25 22 - 32 mmol/L   Glucose, Bld 136 (H) 70 - 99 mg/dL    Comment: Glucose reference range applies only to samples taken after fasting for at least 8 hours.   BUN 7 6 - 20 mg/dL   Creatinine, Ser 0.87 0.61 - 1.24 mg/dL   Calcium 9.8 8.9 - 10.3 mg/dL   Total Protein 7.4 6.5 - 8.1 g/dL   Albumin 4.2 3.5 - 5.0 g/dL   AST 33 15 - 41 U/L   ALT 38 0 - 44 U/L   Alkaline Phosphatase 74 38 - 126 U/L   Total Bilirubin 0.5 0.3 - 1.2 mg/dL   GFR, Estimated >60 >60 mL/min    Comment: (NOTE) Calculated using the CKD-EPI Creatinine Equation (2021)    Anion gap 11 5 - 15    Comment: Performed at [facility] [address] [place], [place] [address]  B Nat Peptide     Status: None   Collection Time: 08/05/22 11:25 AM  Result Value Ref Range   B Natriuretic Peptide 18.1 0.0 - 100.0 pg/mL    Comment: Performed at [facility] [address] [place], [place] [address]  TSH     Status: None   Collection Time: 08/05/22 11:25 AM  Result Value Ref Range   TSH 1.261 0.350 - 4.500  uIU/mL    Comment: Performed by a 3rd Generation assay with a functional sensitivity of <=0.01 uIU/mL. Performed at [facility], [place] [address] [place], [place] [address]   T3, free     Status: None   Collection Time: 08/05/22 11:25 AM  Result Value Ref Range   T3, Free 2.6 2.0 - 4.4 pg/mL    Comment: (NOTE) Performed At: [facility] [address] [place], [place] [id number] Rush Farmer MD FB:[phone]   T4, free     Status: None   Collection Time: 08/05/22 11:25 AM  Result Value Ref Range   Free T4 1.02 0.61 - 1.12 ng/dL    Comment: (NOTE) Biotin ingestion may interfere with free T4 tests. If the results are inconsistent with the TSH level, previous test results, or the clinical presentation, then consider biotin interference. If needed, order repeat testing after stopping biotin. Performed at [facility], [place] [address], [place], [place] [address]   ECHOCARDIOGRAM COMPLETE     Status: None   Collection Time: 08/08/22  2:12 PM  Result Value Ref Range   Area-P 1/2 2.54 cm2   S' Lateral 4.20 cm  Urinalysis, Routine w reflex microscopic Urine, Clean Catch     Status: Abnormal   Collection Time: 09/19/22  6:10 PM  Result Value Ref Range   Color, Urine YELLOW YELLOW   APPearance CLEAR CLEAR   Specific Gravity, Urine 1.011 1.005 - 1.030   pH 5.0 5.0 - 8.0   Glucose, UA >=500 (  A) NEGATIVE mg/dL   Hgb urine dipstick NEGATIVE NEGATIVE   Bilirubin Urine NEGATIVE NEGATIVE   Ketones, ur 5 (A) NEGATIVE mg/dL   Protein, ur NEGATIVE NEGATIVE mg/dL   Nitrite NEGATIVE NEGATIVE   Leukocytes,Ua NEGATIVE NEGATIVE   RBC / HPF 0-5 0 - 5 RBC/hpf   Bacteria, UA NONE SEEN NONE SEEN   Squamous Epithelial / LPF 0-5 0 - 5    Comment: Performed at [facility], [address], [place], [place] [address]  Lipase, blood     Status: None   Collection Time: 09/19/22  6:37 PM  Result Value Ref Range   Lipase 37 11 - 51 U/L    Comment: Performed at [place] [facility] [place], [place] [address]  Comprehensive metabolic panel     Status: None   Collection Time: 09/19/22  6:37 PM  Result Value Ref Range   Sodium 136 135 - 145 mmol/L   Potassium 3.9 3.5 - 5.1 mmol/L   Chloride 103 98 - 111 mmol/L   CO2 23 22 - 32 mmol/L   Glucose, Bld 88 70 - 99 mg/dL    Comment: Glucose reference range applies only to samples taken after fasting for at least 8 hours.   BUN 14 6 - 20 mg/dL   Creatinine, Ser 0.86 0.61 - 1.24 mg/dL   Calcium 9.4 8.9 - 10.3 mg/dL   Total Protein 7.1 6.5 - 8.1 g/dL   Albumin 4.1 3.5 - 5.0 g/dL   AST 23 15 - 41 U/L   ALT 20 0 - 44 U/L   Alkaline Phosphatase 57 38 - 126 U/L   Total Bilirubin 0.7 0.3 - 1.2 mg/dL   GFR, Estimated >60 >60 mL/min    Comment: (NOTE) Calculated using the CKD-EPI Creatinine Equation (2021)    Anion gap 10 5 - 15    Comment: Performed at [facility], [address] [place], [place] [address]  CBC     Status: Abnormal   Collection Time: 09/19/22  6:37 PM  Result Value Ref Range   WBC 13.4 (H) 4.0 - 10.5 K/uL   RBC 5.23 4.22 - 5.81 MIL/uL   Hemoglobin 15.1 13.0 - 17.0 g/dL   HCT 47.1 39.0 - 52.0 %   MCV 90.1 80.0 - 100.0 fL   MCH 28.9 26.0 - 34.0 pg   MCHC 32.1 30.0 - 36.0 g/dL   RDW 13.5 11.5 - 15.5 %   Platelets 313 150 - 400 K/uL   nRBC 0.0 0.0 - 0.2 %    Comment: Performed at [facility], [address], [place], [place] [address]  PSA     Status: None   Collection Time: 10/03/22  8:46 AM  Result Value Ref Range   Prostate Specific Ag, Serum 0.4 0.0 - 4.0 ng/mL    Comment: Roche ECLIA methodology. According to the American Urological Association, Serum PSA should decrease and remain at undetectable levels after radical prostatectomy. The AUA defines biochemical recurrence as an initial PSA value 0.2 ng/mL or greater followed by a subsequent confirmatory PSA value 0.2 ng/mL or greater. Values obtained with different assay methods or kits cannot be used interchangeably. Results cannot be  interpreted as absolute evidence of the presence or absence of malignant disease.   Urinalysis, Routine w reflex microscopic     Status: None   Collection Time: 10/03/22  1:04 PM  Result Value Ref Range   Specific Gravity, UA 1.015 1.005 - 1.030   pH, UA 7.0 5.0 -  7.5   Color, UA Yellow Yellow   Appearance Ur Clear Clear   Leukocytes,UA Negative Negative   Protein,UA Negative Negative/Trace   Glucose, UA Negative Negative   Ketones, UA Negative Negative   RBC, UA Negative Negative   Bilirubin, UA Negative Negative   Urobilinogen, Ur 0.2 0.2 - 1.0 mg/dL   Nitrite, UA Negative Negative   Microscopic Examination Comment     Comment: Microscopic not indicated and not performed.   UA is clear.   I have reviewed his recent ER visit notes.     Assessment & Plan: Left inguinal hernia with pain.   I will refer him to General surgery.    History of prostate cancer.   His PSA at last f/u was down to 0.3 with the seed implant.  He will get that today.  I will have him return in 6 months with a PSA for an exam.   OAB with urgency and UUI.  He was unable to get coverage for [facility] or Myrbetriq.  I have given him additional samples of the Gemtesa and refilled the silodosin.   ED.  Continue the sildenafil  Meds ordered this encounter  Medications   Vibegron (GEMTESA) 75 MG TABS    Sig: Take 75 mg by mouth daily.    Dispense:  84 tablet    Refill:  0   sildenafil (REVATIO) 20 MG tablet    Sig: TAKE 1 TO 5 TABLETS BY MOUTH AS NEEDED    Dispense:  30 tablet    Refill:  11   silodosin (RAPAFLO) 8 MG CAPS capsule    Sig: Take 1 capsule (8 mg total) by mouth daily with breakfast.    Dispense:  30 capsule    Refill:  11     Orders Placed This Encounter  Procedures   PSA    Standing Status:   Future    Standing Expiration Date:   10/04/2023   Urinalysis, Routine w reflex microscopic   Ambulatory referral to General Surgery    Referral Priority:   Urgent    Referral Type:    Surgical    Referral Reason:   Specialty Services Required    Requested Specialty:   General Surgery    Number of Visits Requested:   1      Return in about 6 months (around 04/04/2023) for with PSA.   CC:  [place], MD      Irine Seal 10/04/2022

## 2022-10-03 NOTE — Telephone Encounter (Signed)
Patient came in today and voiced that he is having pain in the groin area, patient voiced that his PCP sent him to [facility] and was told he has an hernia. Made patient aware I will speak with Dr.Wrenn to see if he can be seen today. Patient voiced understanding

## 2022-10-04 LAB — PSA: Prostate Specific Ag, Serum: 0.4 ng/mL (ref 0.0–4.0)

## 2022-10-07 ENCOUNTER — Encounter: Payer: Self-pay | Admitting: Physical Medicine and Rehabilitation

## 2022-10-08 ENCOUNTER — Ambulatory Visit (INDEPENDENT_AMBULATORY_CARE_PROVIDER_SITE_OTHER): Payer: 59

## 2022-10-08 DIAGNOSIS — I428 Other cardiomyopathies: Secondary | ICD-10-CM

## 2022-10-09 ENCOUNTER — Other Ambulatory Visit: Payer: Self-pay | Admitting: Physical Medicine and Rehabilitation

## 2022-10-09 DIAGNOSIS — M5416 Radiculopathy, lumbar region: Secondary | ICD-10-CM

## 2022-10-09 LAB — CUP PACEART REMOTE DEVICE CHECK
Battery Remaining Longevity: 75 mo
Battery Remaining Percentage: 91 %
Battery Voltage: 3.14 V
Brady Statistic AP VP Percent: 1 %
Brady Statistic AP VS Percent: 1 %
Brady Statistic AS VP Percent: 98 %
Brady Statistic AS VS Percent: 1 %
Brady Statistic RA Percent Paced: 1 %
Date Time Interrogation Session: 20231226040017
HighPow Impedance: 59 Ohm
HighPow Impedance: 59 Ohm
Implantable Lead Connection Status: 753985
Implantable Lead Connection Status: 753985
Implantable Lead Connection Status: 753985
Implantable Lead Implant Date: 20230626
Implantable Lead Implant Date: 20230626
Implantable Lead Implant Date: 20230626
Implantable Lead Location: 753858
Implantable Lead Location: 753859
Implantable Lead Location: 753860
Implantable Lead Model: 7122
Implantable Pulse Generator Implant Date: 20230626
Lead Channel Impedance Value: 490 Ohm
Lead Channel Impedance Value: 580 Ohm
Lead Channel Impedance Value: 600 Ohm
Lead Channel Pacing Threshold Amplitude: 0.5 V
Lead Channel Pacing Threshold Amplitude: 0.625 V
Lead Channel Pacing Threshold Amplitude: 2.125 V
Lead Channel Pacing Threshold Pulse Width: 0.5 ms
Lead Channel Pacing Threshold Pulse Width: 0.5 ms
Lead Channel Pacing Threshold Pulse Width: 0.5 ms
Lead Channel Sensing Intrinsic Amplitude: 11.6 mV
Lead Channel Sensing Intrinsic Amplitude: 2.3 mV
Lead Channel Setting Pacing Amplitude: 1.625
Lead Channel Setting Pacing Amplitude: 2 V
Lead Channel Setting Pacing Amplitude: 2.625
Lead Channel Setting Pacing Pulse Width: 0.5 ms
Lead Channel Setting Pacing Pulse Width: 0.5 ms
Lead Channel Setting Sensing Sensitivity: 0.5 mV
Pulse Gen Serial Number: 8949760

## 2022-10-10 ENCOUNTER — Encounter: Payer: Self-pay | Admitting: General Surgery

## 2022-10-10 ENCOUNTER — Ambulatory Visit: Payer: 59 | Admitting: General Surgery

## 2022-10-10 ENCOUNTER — Ambulatory Visit: Payer: 59 | Admitting: Urology

## 2022-10-10 ENCOUNTER — Other Ambulatory Visit: Payer: Self-pay | Admitting: *Deleted

## 2022-10-10 VITALS — BP 113/72 | HR 64 | Temp 97.5°F | Resp 12 | Ht 68.0 in | Wt 174.0 lb

## 2022-10-10 DIAGNOSIS — I5020 Unspecified systolic (congestive) heart failure: Secondary | ICD-10-CM | POA: Diagnosis not present

## 2022-10-10 DIAGNOSIS — J439 Emphysema, unspecified: Secondary | ICD-10-CM | POA: Diagnosis not present

## 2022-10-10 DIAGNOSIS — Z8546 Personal history of malignant neoplasm of prostate: Secondary | ICD-10-CM | POA: Diagnosis not present

## 2022-10-10 DIAGNOSIS — R911 Solitary pulmonary nodule: Secondary | ICD-10-CM | POA: Diagnosis not present

## 2022-10-10 DIAGNOSIS — R030 Elevated blood-pressure reading, without diagnosis of hypertension: Secondary | ICD-10-CM | POA: Diagnosis not present

## 2022-10-10 DIAGNOSIS — Z6825 Body mass index (BMI) 25.0-25.9, adult: Secondary | ICD-10-CM | POA: Diagnosis not present

## 2022-10-10 DIAGNOSIS — K4090 Unilateral inguinal hernia, without obstruction or gangrene, not specified as recurrent: Secondary | ICD-10-CM

## 2022-10-10 DIAGNOSIS — I429 Cardiomyopathy, unspecified: Secondary | ICD-10-CM | POA: Diagnosis not present

## 2022-10-10 DIAGNOSIS — I493 Ventricular premature depolarization: Secondary | ICD-10-CM | POA: Diagnosis not present

## 2022-10-11 NOTE — Progress Notes (Addendum)
Ruben Duffy; [id number]; 03/21/1966   HPI Patient is a 56 year old white male who was referred to my care by Dr. Annabell Howells of urology for evaluation and treatment of a left inguinal hernia.  Patient has had the hernia for approximately 2 months.  It is made worse with straining.  It does cause him some pain at the site.  He denies any nausea or vomiting.  He has a significant cardiac history including hypertension, coronary artery disease, congestive heart failure, and is status post AICD placement for left bundle branch block.  It does reduce on its own when he lies down.  No episodes of incarceration noted. Past Medical History:  Diagnosis Date   Arthritis    CHF (congestive heart failure) (HCC)    Emphysema of lung (HCC)    GERD (gastroesophageal reflux disease)    Hypertension    Hypospadias, balanic    chronic   LBBB (left bundle branch block)    newly dx by ekg done 04-26-2021 for surgery on 06-05-2021,  pt stated he not had a previous ekg before, so pt sent for cardiac clearance prior to surgery;  cardiologist , dr berry, office note clearance in epic 05-09-2021 gave pt clearance with no further work-up needed   Lower urinary tract symptoms (LUTS)    Prostate cancer [facility])    urologist-- dr Annabell Howells--- dx by bx 01-08-2021, Gleason 3+4, T2a   Substance abuse [facility])     Past Surgical History:  Procedure Laterality Date   BIOPSY PROSTATE     BIV ICD INSERTION CRT-D N/A 04/08/2022   Procedure: BIV ICD INSERTION CRT-D;  Surgeon: Duke Salvia, MD;  Location: [facility] INVASIVE CV LAB;  Service: Cardiovascular;  Laterality: N/A;   CIRCUMCISION N/A 06/05/2021   Procedure: CIRCUMCISION ADULT;  Surgeon: Bjorn Pippin, MD;  Location: [facility];  Service: Urology;  Laterality: N/A;   HEMORRHOID SURGERY     HYPOSPADIAS CORRECTION     RADIOACTIVE SEED IMPLANT N/A 06/05/2021   Procedure: RADIOACTIVE SEED IMPLANT/BRACHYTHERAPY IMPLANT;  Surgeon: Bjorn Pippin, MD;  Location: [facility];  Service: Urology;  Laterality: N/A;   RIGHT/LEFT HEART CATH AND CORONARY ANGIOGRAPHY N/A 09/10/2021   Procedure: RIGHT/LEFT HEART CATH AND CORONARY ANGIOGRAPHY;  Surgeon: Dolores Patty, MD;  Location: [facility] INVASIVE CV LAB;  Service: Cardiovascular;  Laterality: N/A;   SPACE OAR INSTILLATION N/A 06/05/2021   Procedure: SPACE OAR INSTILLATION;  Surgeon: Bjorn Pippin, MD;  Location: [facility];  Service: Urology;  Laterality: N/A;    Family History  Problem Relation Age of Onset   Stroke Father    Lung cancer Maternal Aunt    Diabetes Maternal Uncle    Brain cancer Maternal Uncle    Lung cancer Maternal Uncle    Lung cancer Maternal Uncle    Cancer Paternal Grandmother    Diabetes Paternal Grandfather    Colon cancer Neg Hx    Esophageal cancer Neg Hx    Stomach cancer Neg Hx    Pancreatic cancer Neg Hx    Colon polyps Neg Hx    Rectal cancer Neg Hx     Current Outpatient Medications on File Prior to Visit  Medication Sig Dispense Refill   amiodarone (PACERONE) 200 MG tablet Take 0.5 tablets (100 mg total) by mouth daily. 90 tablet 3   atorvastatin (LIPITOR) 40 MG tablet Take 1 tablet (40 mg total) by mouth daily. 90 tablet 3   carvedilol (COREG) 3.125 MG tablet Take 1 tablet (3.125  mg total) by mouth 2 (two) times daily with a meal. 180 tablet 3   cyclobenzaprine (FLEXERIL) 10 MG tablet SMARTSIG:1 Tablet(s) By Mouth Every Evening PRN     DULoxetine (CYMBALTA) 60 MG capsule Take 60 mg by mouth daily.     FARXIGA 10 MG TABS tablet Take 1 tablet by mouth once daily 30 tablet 0   furosemide (LASIX) 20 MG tablet Take 20 mg by mouth daily.     MAGNESIUM-OXIDE 400 (240 Mg) MG tablet Take 1/2 (one-half) tablet by mouth once daily 30 tablet 0   Multiple Vitamin (MULTIVITAMIN ADULT PO) Take by mouth.     omeprazole (PRILOSEC) 40 MG capsule Take 40 mg by mouth daily.     potassium chloride SA (KLOR-CON M) 20 MEQ tablet Take 1 tablet by mouth once daily 30 tablet 11    sacubitril-valsartan (ENTRESTO) 24-26 MG Take 1 tablet by mouth 2 (two) times daily. 180 tablet 3   sildenafil (REVATIO) 20 MG tablet TAKE 1 TO 5 TABLETS BY MOUTH AS NEEDED 30 tablet 11   silodosin (RAPAFLO) 8 MG CAPS capsule Take 1 capsule (8 mg total) by mouth daily with breakfast. 30 capsule 11   spironolactone (ALDACTONE) 25 MG tablet Take 1 tablet (25 mg total) by mouth daily. 90 tablet 3   traMADol (ULTRAM) 50 MG tablet Take 50 mg by mouth 2 (two) times daily as needed.     triamcinolone (KENALOG) 0.025 % ointment Apply topically daily as needed (itching, rash).     valACYclovir (VALTREX) 500 MG tablet Take 500 mg by mouth 2 (two) times daily as needed.     Vibegron (GEMTESA) 75 MG TABS Take 75 mg by mouth daily. 84 tablet 0   No current facility-administered medications on file prior to visit.    No Known Allergies  Social History   Substance and Sexual Activity  Alcohol Use Not Currently    Social History   Tobacco Use  Smoking Status Former   Packs/day: 1.50   Years: 30.00   Total pack years: 45.00   Types: Cigarettes   Quit date: 2009   Years since quitting: 15.0  Smokeless Tobacco Never    Review of Systems  Constitutional:  Negative for malaise/fatigue.  HENT: Negative.    Eyes: Negative.   Respiratory:  Positive for shortness of breath.   Cardiovascular: Negative.   Gastrointestinal:  Positive for abdominal pain.  Musculoskeletal:  Positive for back pain and joint pain.  Skin: Negative.   Neurological: Negative.   Endo/Heme/Allergies: Negative.   Psychiatric/Behavioral: Negative.      Objective   Vitals:   10/10/22 1053  BP: 113/72  Pulse: 64  Resp: 12  Temp: (!) 97.5 F (36.4 C)  SpO2: 96%    Physical Exam Vitals reviewed.  Constitutional:      Appearance: Normal appearance. He is normal weight. He is not ill-appearing.  HENT:     Head: Normocephalic and atraumatic.  Cardiovascular:     Rate and Rhythm: Normal rate and regular rhythm.      Heart sounds: Normal heart sounds. No murmur heard.    No friction rub. No gallop.  Pulmonary:     Effort: Pulmonary effort is normal. No respiratory distress.     Breath sounds: Normal breath sounds. No stridor. No wheezing, rhonchi or rales.  Abdominal:     General: Abdomen is flat. Bowel sounds are normal. There is no distension.     Palpations: Abdomen is soft. There is no mass.  Tenderness: There is no abdominal tenderness. There is no guarding or rebound.     Hernia: A hernia is present.     Comments: Easily reducible left inguinal hernia.  It is small in nature.  Reducible umbilical hernia also present.  Genitourinary:    Testes: Normal.  Skin:    General: Skin is warm and dry.  Neurological:     Mental Status: He is alert and oriented to person, place, and time.     Assessment  Left inguinal hernia Significant cardiac disease history including left bundle branch block, status post AICD placement, cardiac ejection fraction of 30 to 35% Plan  I told the patient that he should probably have surgery at [facility] given the fact that his cardiologist are there and can handle any issues that may arise from undergoing anesthesia.  He is at higher risk for surgical intervention because of his cardiac disease.  He understands and agrees.  I will make referral to [facility] for further evaluation and treatment.

## 2022-10-16 ENCOUNTER — Encounter: Payer: Self-pay | Admitting: General Surgery

## 2022-10-16 ENCOUNTER — Telehealth: Payer: Self-pay

## 2022-10-16 NOTE — Telephone Encounter (Signed)
Left Detail VM making patient aware that his PSA remain low.

## 2022-10-16 NOTE — Telephone Encounter (Signed)
-----   Message from Irine Seal, MD sent at 10/09/2022  9:45 AM EST ----- Psa remains low ----- Message ----- From: Sherrilyn Rist, CMA Sent: 10/04/2022   8:38 AM EST To: Irine Seal, MD  Please review

## 2022-10-17 ENCOUNTER — Encounter: Payer: Self-pay | Admitting: General Surgery

## 2022-10-19 ENCOUNTER — Encounter: Payer: Self-pay | Admitting: Orthopaedic Surgery

## 2022-10-23 ENCOUNTER — Telehealth: Payer: Self-pay

## 2022-10-23 ENCOUNTER — Other Ambulatory Visit: Payer: Self-pay | Admitting: *Deleted

## 2022-10-23 ENCOUNTER — Encounter: Payer: Self-pay | Admitting: Surgery

## 2022-10-23 ENCOUNTER — Ambulatory Visit: Payer: 59 | Admitting: Surgery

## 2022-10-23 VITALS — BP 120/77 | HR 92 | Temp 99.1°F | Ht 68.0 in | Wt 177.6 lb

## 2022-10-23 DIAGNOSIS — K429 Umbilical hernia without obstruction or gangrene: Secondary | ICD-10-CM

## 2022-10-23 DIAGNOSIS — K4090 Unilateral inguinal hernia, without obstruction or gangrene, not specified as recurrent: Secondary | ICD-10-CM

## 2022-10-23 DIAGNOSIS — Z87891 Personal history of nicotine dependence: Secondary | ICD-10-CM

## 2022-10-23 DIAGNOSIS — Z122 Encounter for screening for malignant neoplasm of respiratory organs: Secondary | ICD-10-CM

## 2022-10-23 NOTE — Patient Instructions (Addendum)
Our surgery scheduler Barbara will call you within 24-48 hours to get you scheduled. If you have not heard from her after 48 hours, please call our office. Have the blue sheet available when she calls to write down important information.   If you have any concerns or questions, please feel free to call our office.   Laparoscopic Inguinal Hernia Repair, Adult Laparoscopic inguinal hernia repair is a surgical procedure to repair a small, weak spot in the groin muscles that allows fat or intestines from inside the abdomen to bulge out (inguinal hernia). This procedure may be planned, or it may be an emergency procedure. During the procedure, tissue that has bulged out is moved back into place, and the opening in the groin muscles is repaired. This is done through three small incisions in the abdomen. A thin tube with a light and camera on the end (laparoscope) is used to help perform the procedure. Tell a health care provider about: Any allergies you have. All medicines you are taking, including vitamins, herbs, eye drops, creams, and over-the-counter medicines. Any problems you or family members have had with anesthetic medicines. Any blood disorders you have. Any surgeries you have had. Any medical conditions you have. Whether you are pregnant or may be pregnant. What are the risks? Generally, this is a safe procedure. However, problems may occur, including: Infection. Bleeding. Allergic reactions to medicines. Damage to nearby structures or organs. Testicle damage or long-term pain and swelling of the scrotum, in males. Inability to completely empty the bladder (urinary retention). Blood clots. A collection of fluid that builds up under the skin (seroma). The hernia coming back (recurrence). What happens before the procedure? Staying hydrated Follow instructions from your health care provider about hydration, which may include: Up to 2 hours before the procedure - you may continue to  drink clear liquids, such as water, clear fruit juice, black coffee, and plain tea.  Eating and drinking restrictions Follow instructions from your health care provider about eating and drinking, which may include: 8 hours before the procedure - stop eating heavy meals or foods, such as meat, fried foods, or fatty foods. 6 hours before the procedure - stop eating light meals or foods, such as toast or cereal. 6 hours before the procedure - stop drinking milk or drinks that contain milk. 2 hours before the procedure - stop drinking clear liquids. Medicines Ask your health care provider about: Changing or stopping your regular medicines. This is especially important if you are taking diabetes medicines or blood thinners. Taking medicines such as aspirin and ibuprofen. These medicines can thin your blood. Do not take these medicines unless your health care provider tells you to take them. Taking over-the-counter medicines, vitamins, herbs, and supplements. General instructions Do not use any products that contain nicotine or tobacco for at least 4 weeks before the procedure, if possible. These products include cigarettes, chewing tobacco, and vaping devices, such as e-cigarettes. If you need help quitting, ask your health care provider. Ask your health care provider: How your surgery site will be marked. What steps will be taken to help prevent infection. These steps may include: Removing hair at the surgery site. Washing skin with a germ-killing soap. Taking antibiotic medicine. Plan to have a responsible adult take you home from the hospital or clinic. Plan to have a responsible adult care for you for the time you are told after you leave the hospital or clinic. This is important. What happens during the procedure? An IV will   be inserted into one of your veins. You will be given one or more of the following: A medicine to help you relax (sedative). A medicine to make you fall asleep  (general anesthetic). Three small incisions will be made in your abdomen. Your abdomen will be inflated with carbon dioxide gas to make the surgical area easier to see. A laparoscope and surgical instruments will be inserted through the incisions. The laparoscope will send images of the inside of your abdomen to a monitor in the room. Tissue that is bulging through the hernia may be removed or moved back into place. The hernia opening will be closed with a sheet of surgical mesh. The surgical instruments and laparoscope will be removed. Your incisions will be closed with stitches (sutures) and adhesive strips. A bandage (dressing) will be placed over your incisions. The procedure may vary among health care providers and hospitals. What happens after the procedure? Your blood pressure, heart rate, breathing rate, and blood oxygen level will be monitored until you leave the hospital or clinic. You will be given pain medicine as needed. You may continue to receive medicines and fluids through an IV. The IV will be removed after you can drink fluids. You will be encouraged to get up and move around and to take deep breaths frequently. If you were given a sedative during the procedure, it can affect you for several hours. Do not drive or operate machinery until your health care provider says that it is safe. Summary Laparoscopic inguinal hernia repair is a surgical procedure to repair a small, weak spot in the groin muscles that allows fat or intestines from inside the abdomen to bulge out (inguinal hernia). This procedure is done through three small incisions in the abdomen. A thin tube with a light and camera on the end (laparoscope) is used to help perform the procedure. After the procedure, you will be encouraged to get up and move around and to take deep breaths frequently. This information is not intended to replace advice given to you by your health care provider. Make sure you discuss any  questions you have with your health care provider. Document Revised: 05/30/2020 Document Reviewed: 05/30/2020 Elsevier Patient Education  2023 Elsevier Inc.  

## 2022-10-23 NOTE — Telephone Encounter (Signed)
Faxed cardiac clearance to Dr. Haroldine Laws at [phone].

## 2022-10-24 ENCOUNTER — Telehealth: Payer: Self-pay | Admitting: Surgery

## 2022-10-24 ENCOUNTER — Ambulatory Visit: Payer: 59 | Admitting: Physical Medicine and Rehabilitation

## 2022-10-24 ENCOUNTER — Ambulatory Visit: Payer: Self-pay

## 2022-10-24 VITALS — BP 111/73 | HR 76

## 2022-10-24 DIAGNOSIS — M5416 Radiculopathy, lumbar region: Secondary | ICD-10-CM | POA: Diagnosis not present

## 2022-10-24 MED ORDER — METHYLPREDNISOLONE ACETATE 80 MG/ML IJ SUSP
80.0000 mg | Freq: Once | INTRAMUSCULAR | Status: AC
  Administered 2022-10-24: 80 mg

## 2022-10-24 NOTE — Telephone Encounter (Signed)
Left message for patient to call, please inform him of the following regarding scheduled surgery with Dr. Dahlia Byes.   Pre-Admission date/time, and Surgery date at [facility]  Surgery Date: 11/19/22 Preadmission Testing Date: 11/08/22 (phone 1p-5p)  Also patient will need to call at [phone], between 1-3:00pm the day before surgery, to find out what time to arrive for surgery.

## 2022-10-24 NOTE — Patient Instructions (Signed)

## 2022-10-24 NOTE — Progress Notes (Signed)
Functional Pain Scale - descriptive words and definitions  Intense (8)    Cannot complete any ADLs without much assistance/cannot concentrate/conversation is difficult/unable to sleep and unable to use distraction. Severe range order  Average Pain 9   +Driver, -BT, -Dye Allergies.  Lower back pain on left side, pain is getting worse

## 2022-10-24 NOTE — Telephone Encounter (Signed)
Patient calls back, he is now aware of all dates regarding his surgery.   

## 2022-10-25 ENCOUNTER — Encounter: Payer: Self-pay | Admitting: Surgery

## 2022-10-25 NOTE — Progress Notes (Signed)
Patient ID: Ruben Duffy, male   DOB: 1966-08-06, 57 y.o.   MRN: [id number]  HPI Ruben Duffy is a 57 y.o. male seen in consultation at the request of Dr. Arnoldo Duffy for a symptomatic left inguinal hernia.  He reports intermittent pain in the left inguinal area for several months.  The pain is moderate.  He is placed a toilet roll as a . he reports no prior abdominal operations or inguinal operations.  He does have significant cardiac history to include CHF, hypertension left bundle branch block.  He did have also history of prostate cancer with radiation seeds.  He was a former smoker of 65 pack years he quit in 2009. He did have recent labs including a CBC and CMP was normal.  His last PSA was 0.4 and oriented to urology as he is in remission for prostate cancer. We will make sure that he is optimized from a cardiac standpoint. Did personally review his CT of the chest last year showing emphysema unfortunately the CT did not reach the pelvis to assess for an inguinal hernia. He has reasonable cardiovascular performance and is able to walk without any major shortness of breath or chest pain.  I do think that he is able to perform more than 4 METS of activities without any angina or shortness of breath   HPI  Past Medical History:  Diagnosis Date   Arthritis    CHF (congestive heart failure) (HCC)    Emphysema of lung (HCC)    GERD (gastroesophageal reflux disease)    Hypertension    Hypospadias, balanic    chronic   LBBB (left bundle branch block)    newly dx by ekg done 04-26-2021 for surgery on 06-05-2021,  pt stated he not had a previous ekg before, so pt sent for cardiac clearance prior to surgery;  cardiologist , dr berry, office note clearance in epic 05-09-2021 gave pt clearance with no further work-up needed   Lower urinary tract symptoms (LUTS)    Prostate cancer [facility])    urologist-- dr Ruben Duffy--- dx by bx 01-08-2021, Gleason 3+4, T2a   Substance abuse [facility])     Past Surgical History:   Procedure Laterality Date   BIOPSY PROSTATE     BIV ICD INSERTION CRT-D N/A 04/08/2022   Procedure: BIV ICD INSERTION CRT-D;  Surgeon: Ruben Sprang, MD;  Location: [place] CV LAB;  Service: Cardiovascular;  Laterality: N/A;   CIRCUMCISION N/A 06/05/2021   Procedure: CIRCUMCISION ADULT;  Surgeon: Ruben Seal, MD;  Location: [facility];  Service: Urology;  Laterality: N/A;   HEMORRHOID SURGERY     HYPOSPADIAS CORRECTION     RADIOACTIVE SEED IMPLANT N/A 06/05/2021   Procedure: RADIOACTIVE SEED IMPLANT/BRACHYTHERAPY IMPLANT;  Surgeon: Ruben Seal, MD;  Location: [facility];  Service: Urology;  Laterality: N/A;   RIGHT/LEFT HEART CATH AND CORONARY ANGIOGRAPHY N/A 09/10/2021   Procedure: RIGHT/LEFT HEART CATH AND CORONARY ANGIOGRAPHY;  Surgeon: Ruben Artist, MD;  Location: [place] CV LAB;  Service: Cardiovascular;  Laterality: N/A;   SPACE OAR INSTILLATION N/A 06/05/2021   Procedure: SPACE OAR INSTILLATION;  Surgeon: Ruben Seal, MD;  Location: [facility];  Service: Urology;  Laterality: N/A;    Family History  Problem Relation Age of Onset   Stroke Father    Lung cancer Maternal Aunt    Diabetes Maternal Uncle    Brain cancer Maternal Uncle    Lung cancer Maternal Uncle    Lung cancer Maternal  Uncle    Cancer Paternal Grandmother    Diabetes Paternal Grandfather    Colon cancer Neg Hx    Esophageal cancer Neg Hx    Stomach cancer Neg Hx    Pancreatic cancer Neg Hx    Colon polyps Neg Hx    Rectal cancer Neg Hx     Social History Social History   Tobacco Use   Smoking status: Former    Packs/day: 1.50    Years: 30.00    Total pack years: 45.00    Types: Cigarettes    Quit date: 2009    Years since quitting: 15.0   Smokeless tobacco: Never  Vaping Use   Vaping Use: Never used  Substance Use Topics   Alcohol use: Not Currently   Drug use: Not Currently    Types: Marijuana, "Crack" cocaine    No Known  Allergies  Current Outpatient Medications  Medication Sig Dispense Refill   amiodarone (PACERONE) 200 MG tablet Take 0.5 tablets (100 mg total) by mouth daily. 90 tablet 3   atorvastatin (LIPITOR) 40 MG tablet Take 1 tablet (40 mg total) by mouth daily. 90 tablet 3   carvedilol (COREG) 3.125 MG tablet Take 1 tablet (3.125 mg total) by mouth 2 (two) times daily with a meal. 180 tablet 3   cyclobenzaprine (FLEXERIL) 10 MG tablet SMARTSIG:1 Tablet(s) By Mouth Every Evening PRN     DULoxetine (CYMBALTA) 60 MG capsule Take 60 mg by mouth daily.     FARXIGA 10 MG TABS tablet Take 1 tablet by mouth once daily 30 tablet 0   furosemide (LASIX) 20 MG tablet Take 20 mg by mouth daily.     MAGNESIUM-OXIDE 400 (240 Mg) MG tablet Take 1/2 (one-half) tablet by mouth once daily 30 tablet 0   Multiple Vitamin (MULTIVITAMIN ADULT PO) Take by mouth.     omeprazole (PRILOSEC) 40 MG capsule Take 40 mg by mouth daily.     potassium chloride SA (KLOR-CON M) 20 MEQ tablet Take 1 tablet by mouth once daily 30 tablet 11   sacubitril-valsartan (ENTRESTO) 24-26 MG Take 1 tablet by mouth 2 (two) times daily. 180 tablet 3   sildenafil (REVATIO) 20 MG tablet TAKE 1 TO 5 TABLETS BY MOUTH AS NEEDED 30 tablet 11   silodosin (RAPAFLO) 8 MG CAPS capsule Take 1 capsule (8 mg total) by mouth daily with breakfast. 30 capsule 11   spironolactone (ALDACTONE) 25 MG tablet Take 1 tablet (25 mg total) by mouth daily. 90 tablet 3   traMADol (ULTRAM) 50 MG tablet Take 50 mg by mouth 2 (two) times daily as needed.     triamcinolone (KENALOG) 0.025 % ointment Apply topically daily as needed (itching, rash).     valACYclovir (VALTREX) 500 MG tablet Take 500 mg by mouth 2 (two) times daily as needed.     Vibegron (GEMTESA) 75 MG TABS Take 75 mg by mouth daily. 84 tablet 0   Current Facility-Administered Medications  Medication Dose Route Frequency Provider Last Rate Last Admin   methylPREDNISolone acetate (DEPO-MEDROL) injection 80 mg  80  mg Other Once Ruben Sinning, MD         Review of Systems Full ROS  was asked and was negative except for the information on the HPI  Physical Exam Blood pressure 120/77, pulse 92, temperature 99.1 F (37.3 C), temperature source Oral, height '5\' 8"'$  (1.727 m), weight 177 lb 9.6 oz (80.6 kg), SpO2 96 %. CONSTITUTIONAL: NAD. EYES: Pupils are equal, round, and reactive  to light, Sclera are non-icteric. EARS, NOSE, MOUTH AND THROAT: The oropharynx is clear. The oral mucosa is pink and moist. Hearing is intact to voice. LYMPH NODES:  Lymph nodes in the neck are normal. RESPIRATORY:  Lungs are clear. There is normal respiratory effort, with equal breath sounds bilaterally, and without pathologic use of accessory muscles. CARDIOVASCULAR: Heart is regular without murmurs, gallops, or rubs. GI: The abdomen is  soft, nontender, and nondistended. There are no palpable masses. There is no hepatosplenomegaly. There are normal bowel sounds in all quadrants.   evidence of a reducible left inguinal hernia that is tender.  There is no peritonitis.   Additionally he does have a umbilical hernia. GU: Rectal deferred.   MUSCULOSKELETAL: Normal muscle strength and tone. No cyanosis or edema.   SKIN: Turgor is good and there are no pathologic skin lesions or ulcers. NEUROLOGIC: Motor and sensation is grossly normal. Cranial nerves are grossly intact. PSYCH:  Oriented to person, place and time. Affect is normal.  Data Reviewed  I have personally reviewed the patient's imaging, laboratory findings and medical records.    Assessment /Plan 57 year old with significant cardiac history to include left bundle branch, CHF, hypertension with a symptomatic left inguinal hernia and umbilical hernia.  Discussed with the patient in detail about his disease process.  I do definitely recommend repair of inguinal hernia and I do think that we can do this robotically.  Additionally I do think that we can take care of his  umbilical hernia at the same time.  He will need appropriate cardiac optimization before we can set up his surgery.  I had an extensive discussion with him and his mother regarding disease process.  Options for repair open versus robotic.  I personally think robotic in my hands is better.  Postoperative course and outcomes were also explained to him.  Specifically I talk about chronic pain since he already has pain.  Procedure discussed with him and his mother in detail.  Risk, benefits and possible complications including but not limited to: Bleeding, infection, recurrence, bowel injuries, chronic pain.  They understand and wish to proceed. We will tentatively placing on schedule for February.  Please note that I spent 55 minutes in this encounter including personally reviewing medical records, coordinating his care, placing orders, performing appropriate documentation and performing extensive counseling. Copy of this report was sent to the referring provider    Caroleen Hamman, MD FACS General Surgeon 10/25/2022, 7:18 AM

## 2022-10-25 NOTE — H&P (View-Only) (Signed)
Patient ID: Ruben Duffy, male   DOB: 1966-03-17, 57 y.o.   MRN: [id number]  HPI Ruben Duffy is a 57 y.o. male seen in consultation at the request of Dr. Arnoldo Morale for a symptomatic left inguinal hernia.  He reports intermittent pain in the left inguinal area for several months.  The pain is moderate.  He is placed a toilet roll as a . he reports no prior abdominal operations or inguinal operations.  He does have significant cardiac history to include CHF, hypertension left bundle branch block.  He did have also history of prostate cancer with radiation seeds.  He was a former smoker of 65 pack years he quit in 2009. He did have recent labs including a CBC and CMP was normal.  His last PSA was 0.4 and oriented to urology as he is in remission for prostate cancer. We will make sure that he is optimized from a cardiac standpoint. Did personally review his CT of the chest last year showing emphysema unfortunately the CT did not reach the pelvis to assess for an inguinal hernia. He has reasonable cardiovascular performance and is able to walk without any major shortness of breath or chest pain.  I do think that he is able to perform more than 4 METS of activities without any angina or shortness of breath   HPI  Past Medical History:  Diagnosis Date   Arthritis    CHF (congestive heart failure) (HCC)    Emphysema of lung (HCC)    GERD (gastroesophageal reflux disease)    Hypertension    Hypospadias, balanic    chronic   LBBB (left bundle branch block)    newly dx by ekg done 04-26-2021 for surgery on 06-05-2021,  pt stated he not had a previous ekg before, so pt sent for cardiac clearance prior to surgery;  cardiologist , dr berry, office note clearance in epic 05-09-2021 gave pt clearance with no further work-up needed   Lower urinary tract symptoms (LUTS)    Prostate cancer [facility])    urologist-- dr Jeffie Pollock--- dx by bx 01-08-2021, Gleason 3+4, T2a   Substance abuse [facility])     Past Surgical History:   Procedure Laterality Date   BIOPSY PROSTATE     BIV ICD INSERTION CRT-D N/A 04/08/2022   Procedure: BIV ICD INSERTION CRT-D;  Surgeon: Deboraha Sprang, MD;  Location: [place] [facility];  Service: Cardiovascular;  Laterality: N/A;   CIRCUMCISION N/A 06/05/2021   Procedure: CIRCUMCISION ADULT;  Surgeon: Irine Seal, MD;  Location: [facility];  Service: Urology;  Laterality: N/A;   HEMORRHOID SURGERY     HYPOSPADIAS CORRECTION     RADIOACTIVE SEED IMPLANT N/A 06/05/2021   Procedure: RADIOACTIVE SEED IMPLANT/BRACHYTHERAPY IMPLANT;  Surgeon: Irine Seal, MD;  Location: [facility];  Service: Urology;  Laterality: N/A;   RIGHT/LEFT HEART CATH AND CORONARY ANGIOGRAPHY N/A 09/10/2021   Procedure: RIGHT/LEFT HEART CATH AND CORONARY ANGIOGRAPHY;  Surgeon: Jolaine Artist, MD;  Location: [place] [facility];  Service: Cardiovascular;  Laterality: N/A;   SPACE OAR INSTILLATION N/A 06/05/2021   Procedure: SPACE OAR INSTILLATION;  Surgeon: Irine Seal, MD;  Location: [facility];  Service: Urology;  Laterality: N/A;    Family History  Problem Relation Age of Onset   Stroke Father    Lung cancer Maternal Aunt    Diabetes Maternal Uncle    Brain cancer Maternal Uncle    Lung cancer Maternal Uncle    Lung cancer Maternal  Uncle    Cancer Paternal Grandmother    Diabetes Paternal Grandfather    Colon cancer Neg Hx    Esophageal cancer Neg Hx    Stomach cancer Neg Hx    Pancreatic cancer Neg Hx    Colon polyps Neg Hx    Rectal cancer Neg Hx     Social History Social History   Tobacco Use   Smoking status: Former    Packs/day: 1.50    Years: 30.00    Total pack years: 45.00    Types: Cigarettes    Quit date: 2009    Years since quitting: 15.0   Smokeless tobacco: Never  Vaping Use   Vaping Use: Never used  Substance Use Topics   Alcohol use: Not Currently   Drug use: Not Currently    Types: Marijuana, "Crack" cocaine    No Known  Allergies  Current Outpatient Medications  Medication Sig Dispense Refill   amiodarone (PACERONE) 200 MG tablet Take 0.5 tablets (100 mg total) by mouth daily. 90 tablet 3   atorvastatin (LIPITOR) 40 MG tablet Take 1 tablet (40 mg total) by mouth daily. 90 tablet 3   carvedilol (COREG) 3.125 MG tablet Take 1 tablet (3.125 mg total) by mouth 2 (two) times daily with a meal. 180 tablet 3   cyclobenzaprine (FLEXERIL) 10 MG tablet SMARTSIG:1 Tablet(s) By Mouth Every Evening PRN     DULoxetine (CYMBALTA) 60 MG capsule Take 60 mg by mouth daily.     FARXIGA 10 MG TABS tablet Take 1 tablet by mouth once daily 30 tablet 0   furosemide (LASIX) 20 MG tablet Take 20 mg by mouth daily.     MAGNESIUM-OXIDE 400 (240 Mg) MG tablet Take 1/2 (one-half) tablet by mouth once daily 30 tablet 0   Multiple Vitamin (MULTIVITAMIN ADULT PO) Take by mouth.     omeprazole (PRILOSEC) 40 MG capsule Take 40 mg by mouth daily.     potassium chloride SA (KLOR-CON M) 20 MEQ tablet Take 1 tablet by mouth once daily 30 tablet 11   sacubitril-valsartan (ENTRESTO) 24-26 MG Take 1 tablet by mouth 2 (two) times daily. 180 tablet 3   sildenafil (REVATIO) 20 MG tablet TAKE 1 TO 5 TABLETS BY MOUTH AS NEEDED 30 tablet 11   silodosin (RAPAFLO) 8 MG CAPS capsule Take 1 capsule (8 mg total) by mouth daily with breakfast. 30 capsule 11   spironolactone (ALDACTONE) 25 MG tablet Take 1 tablet (25 mg total) by mouth daily. 90 tablet 3   traMADol (ULTRAM) 50 MG tablet Take 50 mg by mouth 2 (two) times daily as needed.     triamcinolone (KENALOG) 0.025 % ointment Apply topically daily as needed (itching, rash).     valACYclovir (VALTREX) 500 MG tablet Take 500 mg by mouth 2 (two) times daily as needed.     Vibegron (GEMTESA) 75 MG TABS Take 75 mg by mouth daily. 84 tablet 0   Current Facility-Administered Medications  Medication Dose Route Frequency Provider Last Rate Last Admin   methylPREDNISolone acetate (DEPO-MEDROL) injection 80 mg  80  mg Other Once Magnus Sinning, MD         Review of Systems Full ROS  was asked and was negative except for the information on the HPI  Physical Exam Blood pressure 120/77, pulse 92, temperature 99.1 F (37.3 C), temperature source Oral, height '5\' 8"'$  (1.727 m), weight 177 lb 9.6 oz (80.6 kg), SpO2 96 %. CONSTITUTIONAL: NAD. EYES: Pupils are equal, round, and reactive  to light, Sclera are non-icteric. EARS, NOSE, MOUTH AND THROAT: The oropharynx is clear. The oral mucosa is pink and moist. Hearing is intact to voice. LYMPH NODES:  Lymph nodes in the neck are normal. RESPIRATORY:  Lungs are clear. There is normal respiratory effort, with equal breath sounds bilaterally, and without pathologic use of accessory muscles. CARDIOVASCULAR: Heart is regular without murmurs, gallops, or rubs. GI: The abdomen is  soft, nontender, and nondistended. There are no palpable masses. There is no hepatosplenomegaly. There are normal bowel sounds in all quadrants.   evidence of a reducible left inguinal hernia that is tender.  There is no peritonitis.   Additionally he does have a umbilical hernia. GU: Rectal deferred.   MUSCULOSKELETAL: Normal muscle strength and tone. No cyanosis or edema.   SKIN: Turgor is good and there are no pathologic skin lesions or ulcers. NEUROLOGIC: Motor and sensation is grossly normal. Cranial nerves are grossly intact. PSYCH:  Oriented to person, place and time. Affect is normal.  Data Reviewed  I have personally reviewed the patient's imaging, laboratory findings and medical records.    Assessment /Plan 57 year old with significant cardiac history to include left bundle branch, CHF, hypertension with a symptomatic left inguinal hernia and umbilical hernia.  Discussed with the patient in detail about his disease process.  I do definitely recommend repair of inguinal hernia and I do think that we can do this robotically.  Additionally I do think that we can take care of his  umbilical hernia at the same time.  He will need appropriate cardiac optimization before we can set up his surgery.  I had an extensive discussion with him and his mother regarding disease process.  Options for repair open versus robotic.  I personally think robotic in my hands is better.  Postoperative course and outcomes were also explained to him.  Specifically I talk about chronic pain since he already has pain.  Procedure discussed with him and his mother in detail.  Risk, benefits and possible complications including but not limited to: Bleeding, infection, recurrence, bowel injuries, chronic pain.  They understand and wish to proceed. We will tentatively placing on schedule for February.  Please note that I spent 55 minutes in this encounter including personally reviewing medical records, coordinating his care, placing orders, performing appropriate documentation and performing extensive counseling. Copy of this report was sent to the referring provider    Caroleen Hamman, MD FACS General Surgeon 10/25/2022, 7:18 AM

## 2022-10-27 ENCOUNTER — Other Ambulatory Visit (HOSPITAL_COMMUNITY): Payer: Self-pay | Admitting: Family Medicine

## 2022-11-03 NOTE — Procedures (Signed)
Lumbar Epidural Steroid Injection - Interlaminar Approach with Fluoroscopic Guidance  Patient: Ruben Duffy      Date of Birth: [birth year] MRN: [id number] PCP: [place] Nation, MD      Visit Date: 10/24/2022   Universal Protocol:     Consent Given By: the patient  Position: PRONE  Additional Comments: Vital signs were monitored before and after the procedure. Patient was prepped and draped in the usual sterile fashion. The correct patient, procedure, and site was verified.   Injection Procedure Details:   Procedure diagnoses: Lumbar radiculopathy [M54.16]   Meds Administered:  Meds ordered this encounter  Medications   methylPREDNISolone acetate (DEPO-MEDROL) injection 80 mg     Laterality: Left  Location/Site:  L5-S1  Needle: 3.5 in., 20 ga. Tuohy  Needle Placement: Paramedian epidural  Findings:   -Comments: Excellent flow of contrast into the epidural space.  Procedure Details: Using a paramedian approach from the side mentioned above, the region overlying the inferior lamina was localized under fluoroscopic visualization and the soft tissues overlying this structure were infiltrated with 4 ml. of 1% Lidocaine without Epinephrine. The Tuohy needle was inserted into the epidural space using a paramedian approach.   The epidural space was localized using loss of resistance along with counter oblique bi-planar fluoroscopic views.  After negative aspirate for air, blood, and CSF, a 2 ml. volume of Isovue-250 was injected into the epidural space and the flow of contrast was observed. Radiographs were obtained for documentation purposes.    The injectate was administered into the level noted above.   Additional Comments:  No complications occurred Dressing: 2 x 2 sterile gauze and Band-Aid    Post-procedure details: Patient was observed during the procedure. Post-procedure instructions were reviewed.  Patient left the clinic in stable condition.

## 2022-11-03 NOTE — Progress Notes (Signed)
Ruben Duffy - 57 y.o. male MRN [id number]  Date of birth: 21-Jun-1966  Office Visit Note: Visit Date: 10/24/2022 PCP: [place] Nation, MD Referred by: Harvey Nation, MD  Subjective: Chief Complaint  Patient presents with   Lower Back - Pain   HPI:  Ruben Duffy is a 57 y.o. male who comes in today for planned repeat Left L5-S1  Lumbar Interlaminar epidural steroid injection with fluoroscopic guidance.  The patient has failed conservative care including home exercise, medications, time and activity modification.  This injection will be diagnostic and hopefully therapeutic.  Please see requesting physician notes for further details and justification. Patient received more than 50% pain relief from prior injection.   Referring: Dr. Rodell Perna  Consider diagnostic medial branch blocks of the lower spine given the patient's continued mostly axial back pain with improved leg pain.   ROS Otherwise per HPI.  Assessment & Plan: Visit Diagnoses:    ICD-10-CM   1. Lumbar radiculopathy  M54.16 XR C-ARM NO REPORT    Epidural Steroid injection    methylPREDNISolone acetate (DEPO-MEDROL) injection 80 mg      Plan: No additional findings.   Meds & Orders:  Meds ordered this encounter  Medications   methylPREDNISolone acetate (DEPO-MEDROL) injection 80 mg    Orders Placed This Encounter  Procedures   XR C-ARM NO REPORT   Epidural Steroid injection    Follow-up: Return for visit to requesting provider as needed.   Procedures: No procedures performed  Lumbar Epidural Steroid Injection - Interlaminar Approach with Fluoroscopic Guidance  Patient: Ruben Duffy      Date of Birth: Sep 18, 1966 MRN: [id number] PCP: [place] Nation, MD      Visit Date: 10/24/2022   Universal Protocol:     Consent Given By: the patient  Position: PRONE  Additional Comments: Vital signs were monitored before and after the procedure. Patient was prepped and draped in the usual sterile  fashion. The correct patient, procedure, and site was verified.   Injection Procedure Details:   Procedure diagnoses: Lumbar radiculopathy [M54.16]   Meds Administered:  Meds ordered this encounter  Medications   methylPREDNISolone acetate (DEPO-MEDROL) injection 80 mg     Laterality: Left  Location/Site:  L5-S1  Needle: 3.5 in., 20 ga. Tuohy  Needle Placement: Paramedian epidural  Findings:   -Comments: Excellent flow of contrast into the epidural space.  Procedure Details: Using a paramedian approach from the side mentioned above, the region overlying the inferior lamina was localized under fluoroscopic visualization and the soft tissues overlying this structure were infiltrated with 4 ml. of 1% Lidocaine without Epinephrine. The Tuohy needle was inserted into the epidural space using a paramedian approach.   The epidural space was localized using loss of resistance along with counter oblique bi-planar fluoroscopic views.  After negative aspirate for air, blood, and CSF, a 2 ml. volume of Isovue-250 was injected into the epidural space and the flow of contrast was observed. Radiographs were obtained for documentation purposes.    The injectate was administered into the level noted above.   Additional Comments:  No complications occurred Dressing: 2 x 2 sterile gauze and Band-Aid    Post-procedure details: Patient was observed during the procedure. Post-procedure instructions were reviewed.  Patient left the clinic in stable condition.   Clinical History: MRI LUMBAR SPINE WITHOUT CONTRAST   TECHNIQUE: Multiplanar, multisequence MR imaging of the lumbar spine was performed. No intravenous contrast was administered.   COMPARISON:  None Available.  FINDINGS: Segmentation:  Standard.   Alignment: Trace retrolisthesis at L1-L2. Trace anterolisthesis at L5-S1.   Vertebrae: Chronic anterior wedging of L1. Degenerative endplate irregularity at T12-L1: L1-L2,  L3-L4, and L4-L5. Mild degenerative endplate marrow edema. Vertebral body hemangioma at L4.   Conus medullaris and cauda equina: Conus extends to the L1 level. Conus and cauda equina appear normal.   Paraspinal and other soft tissues: Unremarkable.   Disc levels:   T12-L1: Minimal disc bulge.  No canal or foraminal stenosis.   L1-L2: Disc bulge with superimposed left foraminal protrusion and endplate osteophytic ridging. Minor facet arthropathy. No canal stenosis. No right foraminal stenosis. Mild to moderate left foraminal stenosis.   L2-L3: Disc bulge slightly eccentric to the right. Moderate facet arthropathy. No canal stenosis. Minor effacement of the right subarticular recess. No foraminal stenosis.   L3-L4: Disc bulge with superimposed right foraminal/far lateral protrusion and endplate osteophytic ridging. Moderate facet arthropathy with ligamentum flavum infolding. Mild canal stenosis. Partial effacement of the right subarticular recess. Moderate right and minor left foraminal stenosis.   L4-L5:  Minor facet arthropathy.  No canal or foraminal stenosis.   L5-S1: Anterolisthesis with uncovering of disc bulge with superimposed left foraminal protrusion. Moderate to marked facet arthropathy with ligamentum flavum infolding. No canal stenosis. Minor right and marked left foraminal stenosis.   IMPRESSION: Multilevel degenerative changes as detailed above. Facet arthropathy is greatest at L2-L3, L3-L4, and L5-S1. No high-grade canal stenosis. Foraminal narrowing is greatest on the right at L3-L4 and on the left at L5-S1. Right subarticular recess narrowing is present at L3-L4.     Electronically Signed   By: Macy Mis M.D.   On: 03/08/2022 12:31     Objective:  VS:  HT:    WT:   BMI:     BP:111/73  HR:76bpm  TEMP: ( )  RESP:  Physical Exam Vitals and nursing note reviewed.  Constitutional:      General: He is not in acute distress.    Appearance:  Normal appearance. He is not ill-appearing.  HENT:     Head: Normocephalic and atraumatic.     Right Ear: External ear normal.     Left Ear: External ear normal.     Nose: No congestion.  Eyes:     Extraocular Movements: Extraocular movements intact.  Cardiovascular:     Rate and Rhythm: Normal rate.     Pulses: Normal pulses.  Pulmonary:     Effort: Pulmonary effort is normal. No respiratory distress.  Abdominal:     General: There is no distension.     Palpations: Abdomen is soft.  Musculoskeletal:        General: No tenderness or signs of injury.     Cervical back: Neck supple.     Right lower leg: No edema.     Left lower leg: No edema.     Comments: Patient has good distal strength without clonus.  Skin:    Findings: No erythema or rash.  Neurological:     General: No focal deficit present.     Mental Status: He is alert and oriented to person, place, and time.     Sensory: No sensory deficit.     Motor: No weakness or abnormal muscle tone.     Coordination: Coordination normal.  Psychiatric:        Mood and Affect: Mood normal.        Behavior: Behavior normal.      Imaging: No results found.

## 2022-11-04 ENCOUNTER — Telehealth: Payer: Self-pay | Admitting: *Deleted

## 2022-11-04 NOTE — Progress Notes (Signed)
Remote ICD transmission.   

## 2022-11-04 NOTE — Telephone Encounter (Signed)
Faxed FMLA to [name] at [phone]

## 2022-11-07 ENCOUNTER — Telehealth: Payer: Self-pay | Admitting: Surgery

## 2022-11-07 NOTE — Telephone Encounter (Signed)
FMLA forms are refaxed to [name] @ [phone]

## 2022-11-08 ENCOUNTER — Encounter: Payer: Self-pay | Admitting: Urgent Care

## 2022-11-08 ENCOUNTER — Encounter
Admission: RE | Admit: 2022-11-08 | Discharge: 2022-11-08 | Disposition: A | Discharge status: Home / Self Care | Payer: 59 | Source: Ambulatory Visit | Attending: Surgery | Admitting: Surgery

## 2022-11-08 ENCOUNTER — Other Ambulatory Visit: Payer: Self-pay

## 2022-11-08 ENCOUNTER — Encounter: Payer: Self-pay | Admitting: Internal Medicine

## 2022-11-08 VITALS — Ht 68.0 in | Wt 168.0 lb

## 2022-11-08 DIAGNOSIS — R7303 Prediabetes: Secondary | ICD-10-CM

## 2022-11-08 HISTORY — DX: Presence of automatic (implantable) cardiac defibrillator: Z95.810

## 2022-11-08 HISTORY — DX: Prediabetes: R73.03

## 2022-11-08 HISTORY — DX: Inflammatory liver disease, unspecified: K75.9

## 2022-11-08 HISTORY — DX: Presence of cardiac pacemaker: Z95.0

## 2022-11-08 NOTE — Progress Notes (Addendum)
[place] DEVICE PROGRAMMING  Patient Information: Name:  Ruben Duffy  DOB:  13-Sep-1966  MRN:  [id number]    Planned Procedure: XI ROBOTIC ASSISTED INGUINAL HERNIA, HERNIA REPAIR UMBILICAL  Surgeon:  Dr. Caroleen Hamman, MD  Requesting device clearance: Honor Loh, FNP-C  Date of Procedure:  11/19/2022  Cautery will be used.  Device Information:  Clinic EP Physician:  Virl Axe, MD   Device Type:  Defibrillator Manufacturer and Phone #:  [employer]: [phone] Pacemaker Dependent?:  No. Date of Last Device Check:  07/11/22 in office, Remote: 10/08/22 Normal Device Function?:  Yes.    Electrophysiologist's Recommendations:  Provide continuous ECG monitoring when magnet is used or reprogramming is to be performed.  Procedure may interfere with device function.  Magnet should be placed over device during procedure.  Per Device Clinic [address] Diamond Nickel, RN  4:42 PM 11/08/2022

## 2022-11-08 NOTE — Patient Instructions (Signed)
Your procedure is scheduled on: 11/19/22 Report to [place]. To find out your arrival time please call [phone] between 1PM - 3PM on 11/18/22.  Remember: Instructions that are not followed completely may result in serious medical risk, up to and including death, or upon the discretion of your surgeon and anesthesiologist your surgery may need to be rescheduled.     _X__ 1. Do not eat food after midnight the night before your procedure.                 No gum chewing or hard candies. You may drink clear liquids up to 2 hours                 before you are scheduled to arrive for your surgery- DO not drink clear                 liquids within 2 hours of the start of your surgery.                 Clear Liquids include:  water, apple juice without pulp, clear carbohydrate                 drink such as Clearfast or Gatorade, Black Coffee or Tea (Do not add                 anything to coffee or tea). Diabetics water only  __X__2.  On the morning of surgery brush your teeth with toothpaste and water, you                 may rinse your mouth with mouthwash if you wish.  Do not swallow any              toothpaste of mouthwash.     _X__ 3.  No Alcohol for 24 hours before or after surgery.   _X__ 4.  Do Not Smoke or use e-cigarettes For 24 Hours Prior to Your Surgery.                 Do not use any chewable tobacco products for at least 6 hours prior to                 surgery.  ____  5.  Bring all medications with you on the day of surgery if instructed.   __X__  6.  Notify your doctor if there is any change in your medical condition      (cold, fever, infections).     Do not wear jewelry, make-up, hairpins, clips or nail polish. Do not wear lotions, powders, or perfumes. You can wear deodorant Do not shave body hair 48 hours prior to surgery. Men may shave face and neck. Do not bring valuables to the hospital.    [facility] is not  responsible for any belongings or valuables.  Contacts, dentures/partials or body piercings may not be worn into surgery. Bring a case for your contacts, glasses or hearing aids, a denture cup will be supplied. Leave your suitcase in the car. After surgery it may be brought to your room. For patients admitted to the hospital, discharge time is determined by your treatment team.   Patients discharged the day of surgery will need an adult driver. You will not be allowed to drive yourself home, take an uber, lyft or taxi. You may use medical transportation such as [place] as long as you have  an adult with you or waiting to assist you upon your arrival home. You must have an adult over 76 years old in the home with you for the first 24 hours after surgery    __X__ Take these medicines the morning of surgery with A SIP OF WATER:    1. amiodarone (PACERONE) 200 MG tablet   2. atorvastatin (LIPITOR) 40 MG tablet   3. carvedilol (COREG) 3.125 MG tablet   4. DULoxetine (CYMBALTA) 60 MG capsule   5. omeprazole (PRILOSEC) 40 MG capsule   6. Vibegron (GEMTESA) 75 MG TABS 7.  valACYclovir (VALTREX) 500 MG tablet   ____ Fleet Enema (as directed)   ____ Use CHG Soap/SAGE wipes as directed  ____ Use inhalers on the day of surgery  __X__ Stop /Farxiga 3 days prior to surgery LAST DOSE 11/15/22   ____ Take 1/2 of usual insulin dose the night before surgery. No insulin the morning          of surgery.   ____ Stop Blood Thinners Coumadin/Plavix/Xarelto/Pleta/Pradaxa/Eliquis/Effient/Aspirin  on   Or contact your Surgeon, Cardiologist or Medical Doctor regarding  ability to stop your blood thinners  __X__ Stop Anti-inflammatories 7 days before surgery such as Advil, Ibuprofen, Motrin,  BC or Goodies Powder, Naprosyn, Naproxen, Aleve, Aspirin    __X__ Stop all herbals and supplements, fish oil or vitamins  until after surgery.    ____ Bring C-Pap to the hospital.

## 2022-11-12 ENCOUNTER — Telehealth: Payer: Self-pay | Admitting: *Deleted

## 2022-11-12 ENCOUNTER — Encounter: Payer: Self-pay | Admitting: Surgery

## 2022-11-12 ENCOUNTER — Telehealth: Payer: Self-pay

## 2022-11-12 NOTE — Telephone Encounter (Signed)
Documentation that clearance was physically signed by Dr. Haroldine Laws on 11/12/2022 and faxed to [phone], therefore this is a duplicate encounter and will be removed from the preop pool.  Emmaline Life, NP-C  11/12/2022, 10:31 AM [address] Office [phone] Fax [phone]

## 2022-11-12 NOTE — Progress Notes (Addendum)
Cardiology Clearance has been received from Dr Haroldine Laws at [facility]. The patient is cleared at Medium risk for surgery.

## 2022-11-12 NOTE — Telephone Encounter (Signed)
Received a medical clearance form from [facility]  , requesting Cardiology clearance for the following procedure: Robotic inguinal hernia repair. Medical clearance form was signed by Dr. Glori Bickers and successfully faxed to [phone] on Tuesday, January 30,. Form will be scanned into patients chart.

## 2022-11-12 NOTE — Telephone Encounter (Signed)
-----  Message from Karen Kitchens, NP sent at 11/12/2022  9:20 AM EST ----- Regarding: Request for pre-operative cardiac clearance Request for pre-operative cardiac clearance:  1. What type of surgery is being performed?  XI ROBOTIC ASSISTED INGUINAL HERNIA; HERNIA REPAIR UMBILICAL ADULT    2. When is this surgery scheduled?  11/19/2022  3. Type of clearance being requested (medical, pharmacy, both)? MEDICAL   4. Are there any medications that need to be held prior to surgery? NONE  5. Practice name and name of physician performing surgery?  Performing surgeon: Dr. Caroleen Hamman, MD Requesting clearance: Honor Loh, FNP-C    6. Anesthesia type (none, local, MAC, general)? GENERAL  7. What is the office phone and fax number?   Phone: [phone] Fax: [phone]  ATTENTION: Unable to create telephone message as per your standard workflow. Directed by [facility] providers to send requests for cardiac clearance to this pool for appropriate distribution to provider covering pre-operative clearances.   Honor Loh, MSN, APRN, FNP-C, CEN [facility]  Peri-operative Services Nurse Practitioner Phone: [phone] 11/12/22 9:20 AM

## 2022-11-12 NOTE — Progress Notes (Signed)
Perioperative / Anesthesia Services  Pre-Admission Testing Clinical Review / Preoperative Anesthesia Consult  Date: 11/14/22  Patient Demographics:  Name: Ruben Duffy DOB:   July 26, 1966 MRN:   [id number]  Planned Surgical Procedure(s):    Case: [id number] Date/Time: 11/19/22 0715   Procedures:      XI ROBOTIC ASSISTED INGUINAL HERNIA, possible bilateral (Left)     HERNIA REPAIR UMBILICAL ADULT   Anesthesia type: General   Pre-op diagnosis: inguinal hernia, umbilical hernia   Location: [facility] OR ROOM 07 / [facility] FOR AN[facility]   Surgeons: Jules Husbands, MD   NOTE: Available PAT nursing documentation and vital signs have been reviewed. Clinical nursing staff has updated patient's PMH/PSHx, current medication list, and drug allergies/intolerances to ensure comprehensive history available to assist in medical decision making as it pertains to the aforementioned surgical procedure and anticipated anesthetic course. Extensive review of available clinical information personally performed. [facility] and PSHx updated with any diagnoses/procedures that  may have been inadvertently omitted during his intake with the pre-admission testing department's nursing staff.  Clinical Discussion:  Ruben Duffy is a 57 y.o. male who is submitted for pre-surgical anesthesia review and clearance prior to him undergoing the above procedure. Patient is a Former Smoker (45 pack years; quit 10/2007). Pertinent PMH includes: CAD, NICM (s/p CRT-D placement), CHF, cardiomegaly, LBBB, HTN, HLD, prediabetes, GERD (on daily PPI), emphysema, HCV (s/p treatment), LEFT inguinal hernia, umbilical hernia, prostate cancer (s/p brachytherapy), erectile dysfunction (on PDE5i), OA, lumbar DDD, history of substance abuse (THC + crack cocaine).  Patient is followed by cardiology Haroldine Laws, MD). He was last seen in the cardiology clinic on 08/05/2022; notes reviewed. At the time of his clinic visit, patient doing well  overall from a cardiovascular perspective.  Patient with exertional dyspnea mainly associated with inclines.  He has been experiencing nausea, fatigue, and pruritus for approximately 2 weeks.  Patient denied any chest pain, PND, orthopnea, palpitations, significant peripheral edema, weakness, vertiginous symptoms, or presyncope/syncope. Patient with a past medical history significant for cardiovascular diagnoses. Documented physical exam was grossly benign, providing no evidence of acute exacerbation of the patient's documented cardiovascular conditions.   Patient with a history of a nonischemic cardiomyopathy.    TTE performed on 09/07/2021 revealed a severely reduced left ventricular systolic function with an EF of 25-30%.  Diagnostic RIGHT/left heart catheterization performed on 09/10/2021 demonstrated minimal nonobstructive CAD with 30% lesion noted in the proximal LCx.  Filling pressures were low; mean PA = 13 mmHg, mean PCWP = 5 mmHg, CO = 6.2 L/min, CI 3.1 L/min/m.  Cardiac MRI performed on 09/11/2021 revealed a severely dilated left ventricle with an EF of 14%.  There was diffuse hypokinesis.  Septal/lateral dyssynchrony from LBBB noted.  There was mild to moderate right ventricular dilatation with severe diastolic dysfunction; RVEF 15%.  Small area of nonspecific basal inferoseptal RV insertion site LGE.  Patient underwent placement of a [place] CRT-D device on 04/08/2022.  Device is regularly interrogated by patient's primary electrophysiology team.  Device was last remotely interrogated on 10/08/2022, at which time it was noted to be functioning properly.  Most recent TTE was performed on 08/08/2022 revealing a moderately reduced left ventricular systolic function with an EF of 30-35%.  There was global hypokinesis. Left ventricular diastolic Doppler parameters consistent with abnormal relaxation (G1DD).  Right ventricular size and function normal.  There was trivial mitral  valve regurgitation.  All transvalvular gradients were noted to be normal providing no evidence  suggestive of valvular stenosis.  Blood pressure well controlled at 110/60 mmHg on currently prescribed CCB (amiodarone), beta-blocker (carvedilol), diuretic (furosemide + spironolactone) therapies.  NICM and resulting CHF being additionally managed with ARB/ANRi Delene Loll) and SGLT2i (dapagliflozin). Patient is on atorvastatin for his HLD diagnosis and ASCVD prevention.  Is important to note that in the setting of cardiovascular diagnoses, patient is on a PDE5i (sildenafil) for and erectile dysfunction diagnosis.  Patient with a prediabetes diagnosis.  He reports that his blood sugars have been well-controlled.  Last HgbA1c was 6.0% when checked on 09/08/2021. Patient does not have an OSAH diagnosis.  Functional capacity limited by cardiovascular diagnoses and comorbid conditions, however with that being said, patient still felt to be able to achieve at least 4 METS of physical activity without experiencing any degree of angina/anginal equivalent symptoms. Patient scheduled to follow-up with outpatient cardiology in 4 months or sooner if needed.  Ruben Duffy is scheduled for an XI ROBOTIC ASSISTED INGUINAL HERNIA; UMBILICAL HERNIA REPAIR on 11/19/2022 with Dr. Caroleen Hamman, MD.  Given patient's past medical history significant for cardiovascular diagnoses, presurgical cardiac clearance was sought by the PAT team. Per cardiology, "based ACC/AHA guidelines, the patient's past medical history, and the amount of time since his last clinic visit, this patient would be at an overall MODERATE risk for the planned procedure without further cardiovascular testing or intervention at this time". In review of his medication reconciliation, the patient is not noted to be taking any type of anticoagulation or antiplatelet therapies that would need to be held during his perioperative course.  Patient denies previous perioperative  complications with anesthesia in the past. In review of the available records, it is noted that patient underwent a general anesthetic course at [facility] in 05/2021 without documented complications.      11/08/2022    4:11 PM 10/24/2022    3:50 PM 10/24/2022    2:47 PM  Vitals with BMI  Height '5\' 8"'$     Weight 168 lbs    BMI 16.10    Systolic  960 454  Diastolic  73 68  Pulse  76 87    Providers/Specialists:   NOTE: Primary physician provider listed below. Patient may have been seen by APP or partner within same practice.   PROVIDER ROLE / SPECIALTY LAST Suszanne Finch, MD General Surgery (Surgeon) 10/23/2022  [place], MD Primary Care Provider ??  Glori Bickers, MD Cardiology 01/11/2022  Virl Axe, MD Electrophysiology 07/11/2022   Allergies:  Patient has no known allergies.  Current Home Medications:   No current facility-administered medications for this encounter.    amiodarone (PACERONE) 200 MG tablet   atorvastatin (LIPITOR) 40 MG tablet   carvedilol (COREG) 3.125 MG tablet   cyclobenzaprine (FLEXERIL) 10 MG tablet   DULoxetine (CYMBALTA) 60 MG capsule   FARXIGA 10 MG TABS tablet   furosemide (LASIX) 20 MG tablet   MAGNESIUM-OXIDE 400 (240 Mg) MG tablet   Multiple Vitamin (MULTIVITAMIN ADULT PO)   omeprazole (PRILOSEC) 40 MG capsule   potassium chloride SA (KLOR-CON M) 20 MEQ tablet   sacubitril-valsartan (ENTRESTO) 24-26 MG   sildenafil (REVATIO) 20 MG tablet   silodosin (RAPAFLO) 8 MG CAPS capsule   spironolactone (ALDACTONE) 25 MG tablet   traMADol (ULTRAM) 50 MG tablet   triamcinolone (KENALOG) 0.025 % ointment   valACYclovir (VALTREX) 500 MG tablet   Vibegron (GEMTESA) 75 MG TABS   History:   Past Medical History:  Diagnosis Date   AICD (automatic cardioverter/defibrillator) present 04/08/2022   a.) [employer] Quadra [employer]ura CRT-D   Arthritis    CAD (coronary artery disease) 09/10/2021   a.) R/LHC  09/10/2021: 30% pLCx - med mgmt.   Cardiomegaly    CHF (congestive heart failure) ([place])    a.) TTE 09/07/2021: EF 25-30%, diff inf HK, mod LV dil, mod LAE, mod-sev MR; b.) R/LHC 09/10/2021: EF 25%, mPA 13, PCWP 5, CO 6.2, CI 3.1; c.) cMRI 09/11/2021: EF 14%, sev LV dil, diff HK; d.) TTE 01/11/2022: EF 25-30%, glob HK, mod LV dil, mod LAE, mild MR, G2DD; e.) CRT-D implanted 04/08/2022; f.) TTE 08/08/2022: EF 30-35%, glob HK, triv MR, G1DD   DDD (degenerative disc disease), lumbar    Emphysema of lung (HCC)    Erectile dysfunction    a.) on PDE5i (sildenafil) PRN   GERD (gastroesophageal reflux disease)    Hepatitis C virus infection cured after antiviral drug therapy    History of substance abuse ([place])    a.) THC + crack cocaine   HLD (hyperlipidemia)    Hypertension    Hypospadias, balanic    LBBB (left bundle branch block) 04/26/2021   a.) noted on preop ECG 04/26/2021   Left inguinal hernia    Long term current use of amiodarone    Lower urinary tract symptoms (LUTS)    NICM (nonischemic cardiomyopathy) ([place])    a.) TTE 09/07/2021: EF 25-30%; b.) R/LHC 09/10/2021: mPA 13, PCWP 5, CO 6.2, CI 3.1; c.) cMRI 09/11/2021: EF 14%, diff HK, RV insertion site LGE; d.) TTE 01/11/2022: EF 25-30% e.) CRT-D implanted 04/08/2022; f.) TTE 08/08/2022: EF 30-35%   Pre-diabetes    Prostate cancer ([place]) 01/08/2021   a.) Gleason 3+4, T2a; b.) s/p brachytherapy 15/40/0867   Umbilical hernia    Past Surgical History:  Procedure Laterality Date   BIOPSY PROSTATE     BIV ICD INSERTION CRT-D N/A 04/08/2022   Procedure: BIV ICD INSERTION CRT-D;  Surgeon: Deboraha Sprang, MD;  Location: [facility];  Service: Cardiovascular;  Laterality: N/A;   CIRCUMCISION N/A 06/05/2021   Procedure: CIRCUMCISION ADULT;  Surgeon: Irine Seal, MD;  Location: [facility];  Service: Urology;  Laterality: N/A;   HEMORRHOID SURGERY     HYPOSPADIAS CORRECTION     RADIOACTIVE SEED IMPLANT N/A 06/05/2021    Procedure: RADIOACTIVE SEED IMPLANT/BRACHYTHERAPY IMPLANT;  Surgeon: Irine Seal, MD;  Location: [facility];  Service: Urology;  Laterality: N/A;   RIGHT/LEFT HEART CATH AND CORONARY ANGIOGRAPHY N/A 09/10/2021   Procedure: RIGHT/LEFT HEART CATH AND CORONARY ANGIOGRAPHY;  Surgeon: Jolaine Artist, MD;  Location: [facility];  Service: Cardiovascular;  Laterality: N/A;   SPACE OAR INSTILLATION N/A 06/05/2021   Procedure: SPACE OAR INSTILLATION;  Surgeon: Irine Seal, MD;  Location: [facility];  Service: Urology;  Laterality: N/A;   Family History  Problem Relation Age of Onset   Stroke Father    Lung cancer Maternal Aunt    Diabetes Maternal Uncle    Brain cancer Maternal Uncle    Lung cancer Maternal Uncle    Lung cancer Maternal Uncle    Cancer Paternal Grandmother    Diabetes Paternal Grandfather    Colon cancer Neg Hx    Esophageal cancer Neg Hx    Stomach cancer Neg Hx    Pancreatic cancer Neg Hx    Colon polyps Neg Hx    Rectal cancer Neg Hx  Social History   Tobacco Use   Smoking status: Former    Packs/day: 1.50    Years: 30.00    Total pack years: 45.00    Types: Cigarettes    Quit date: 2009    Years since quitting: 15.0   Smokeless tobacco: Never  Vaping Use   Vaping Use: Never used  Substance Use Topics   Alcohol use: Not Currently   Drug use: Not Currently    Types: Marijuana, "Crack" cocaine    Pertinent Clinical Results:  LABS: Labs reviewed: Acceptable for surgery.  Lab Results  Component Value Date   WBC 13.4 (H) 09/19/2022   HGB 15.1 09/19/2022   HCT 47.1 09/19/2022   MCV 90.1 09/19/2022   PLT 313 09/19/2022   Lab Results  Component Value Date   NA 136 09/19/2022   K 3.9 09/19/2022   CO2 23 09/19/2022   GLUCOSE 88 09/19/2022   BUN 14 09/19/2022   CREATININE 0.86 09/19/2022   CALCIUM 9.4 09/19/2022   EGFR 88 03/29/2022   GFRNONAA >60 09/19/2022    ECG: Date: 08/05/2022 Time ECG obtained:  1023 AM Rate: 61 bpm Rhythm:  Atrial sensed ventricular paced rhythm Axis (leads I and aVF): Normal Intervals: PR 106 ms. QRS 170 ms. QTc 493 ms. ST segment and T wave changes: No evidence of acute ST segment elevation or depression Comparison: Similar to previous tracing obtained on 07/11/2022   IMAGING / PROCEDURES: TRANSTHORACIC ECHOCARDIOGRAM performed on 08/08/2022 Left ventricular ejection fraction, by estimation, is 30 to 35%. The left ventricle has moderately decreased function. The left ventricle demonstrates global hypokinesis. The left ventricular internal cavity size was mildly dilated. Left ventricular diastolic parameters are consistent with Grade I diastolic dysfunction (impaired relaxation).  Right ventricular systolic function is normal. The right ventricular size is normal. Tricuspid regurgitation signal is inadequate for assessing PA pressure.  The mitral valve is normal in structure. Trivial mitral valve regurgitation. No evidence of mitral stenosis.  The aortic valve is tricuspid. Aortic valve regurgitation is not visualized. No aortic stenosis is present.  The inferior vena cava is normal in size with greater than 50% respiratory variability, suggesting right atrial pressure of 3 mmHg.   MR LUMBAR SPINE W/O CONTRAST performed on 03/08/2022 Multilevel degenerative changes. Facet arthropathy is greatest at L2-L3, L3-L4, and L5-S1.  No high-grade canal stenosis.  Foraminal narrowing is greatest on the right at L3-L4 and on the left at L5-S1.  Right subarticular recess narrowing is present at L3-L4.   MR CARDIAC MORPHOLOGY W WO CONTRAST performed on 08/11/2021 Severely dilated LV with EF 14%, diffuse hypokinesis. EF is difficult to quantify by Simpson's rule give septal-lateral dyssynchrony from LBBB, but it is quite low. Mild-moderate RV dilation with severe systolic dysfunction, EF 42%. Small area of nonspecific basal inferoseptal RV insertion site LGE, this is often  seen with pressure/volume overload. Nonspecific mild elevation in extracellular volume percentage, not in the range to suggest cardiac amyloidosis.  RIGHT/LEFT HEART CATHETERIZATION AND CORONARY ANGIOGRAPHY performed on 09/10/2021 Severe NICM with an EF of 25% Minimal nonobstructive single-vessel CAD 30% proximal LCx Hemodynamics Ao = 71/49 (58) -> 83/52 (after IVF)  LV = 86/5 RA = 1 RV = 23/1 PA = 24/2 (13) PCW = 5 Fick cardiac output/index = 6.2/3.1 PVR = 1.3 WU SVR = 728 FA sat = 93% PA sat = 69%, 68% SVC sat = 74%   Impression and Plan:  Ruben Duffy has been referred for pre-anesthesia review and clearance prior to  him undergoing the planned anesthetic and procedural courses. Available labs, pertinent testing, and imaging results were personally reviewed by me in preparation for upcoming operative/procedural course. [facility] medical record has been updated following extensive record review and patient interview with PAT staff.   This patient has been appropriately cleared by cardiology with an overall MODERATE risk of significant perioperative cardiovascular complications. Completed perioperative prescription for cardiac device management documentation completed by primary cardiology team and placed on patient's chart for review by the surgical/anesthetic team on the day of his procedure. Electrophysiology indicating that procedure may interfere with planned surgical procedure. They are recommending intraoperative magnet placement and normal perioperative cardiovascular monitoring. Beyond the aforementioned, there are no recommendations from electrophysiology team that prompt further discussion/recommendations from industry representative.   Based on clinical review performed today (11/14/22), barring any significant acute changes in the patient's overall condition, it is anticipated that he will be able to proceed with the planned surgical intervention. Any acute changes in  clinical condition may necessitate his procedure being postponed and/or cancelled. Patient will meet with anesthesia team (MD and/or CRNA) on the day of his procedure for preoperative evaluation/assessment. Questions regarding anesthetic course will be fielded at that time.   Pre-surgical instructions were reviewed with the patient during his PAT appointment, and questions were fielded to satisfaction by PAT clinical staff. He has been instructed on which medications that he will need to hold prior to surgery, as well as the ones that have been deemed safe/appropriate to take of the day of his procedure. As part of the general education provided by PAT, patient made aware both verbally and in writing, that he will need to abstain from the use of any illegal substances during his perioperative course.  He was advised that failure to do so could necessitate the need for case cancellation or result serious perioperative complications up to and including death. Patient encouraged to contact PAT and/or his surgeon's office to discuss any questions or concerns that may arise prior to surgery; verbalized understanding.   Honor Loh, MSN, APRN, FNP-C, CEN [facility]  Peri-operative Services Nurse Practitioner Phone: [phone] Fax: [phone] 11/14/22 3:01 PM  NOTE: This note has been prepared using Dragon dictation software. Despite my best ability to proofread, there is always the potential that unintentional transcriptional errors may still occur from this process.

## 2022-11-14 ENCOUNTER — Encounter: Payer: Self-pay | Admitting: Surgery

## 2022-11-19 ENCOUNTER — Ambulatory Visit: Payer: 59 | Admitting: Urgent Care

## 2022-11-19 ENCOUNTER — Encounter
Admission: RE | Disposition: A | Discharge status: Home / Self Care | Payer: Self-pay | Source: Home / Self Care | Attending: Surgery

## 2022-11-19 ENCOUNTER — Encounter: Payer: Self-pay | Admitting: Surgery

## 2022-11-19 ENCOUNTER — Other Ambulatory Visit: Payer: Self-pay

## 2022-11-19 ENCOUNTER — Ambulatory Visit
Admission: RE | Admit: 2022-11-19 | Discharge: 2022-11-19 | Disposition: A | Discharge status: Home / Self Care | Payer: 59 | Attending: Surgery | Admitting: Surgery

## 2022-11-19 DIAGNOSIS — Z87891 Personal history of nicotine dependence: Secondary | ICD-10-CM | POA: Insufficient documentation

## 2022-11-19 DIAGNOSIS — K219 Gastro-esophageal reflux disease without esophagitis: Secondary | ICD-10-CM | POA: Insufficient documentation

## 2022-11-19 DIAGNOSIS — K4020 Bilateral inguinal hernia, without obstruction or gangrene, not specified as recurrent: Secondary | ICD-10-CM | POA: Diagnosis not present

## 2022-11-19 DIAGNOSIS — I509 Heart failure, unspecified: Secondary | ICD-10-CM | POA: Diagnosis not present

## 2022-11-19 DIAGNOSIS — K4090 Unilateral inguinal hernia, without obstruction or gangrene, not specified as recurrent: Secondary | ICD-10-CM

## 2022-11-19 DIAGNOSIS — G8929 Other chronic pain: Secondary | ICD-10-CM | POA: Diagnosis not present

## 2022-11-19 DIAGNOSIS — E785 Hyperlipidemia, unspecified: Secondary | ICD-10-CM | POA: Insufficient documentation

## 2022-11-19 DIAGNOSIS — I2510 Atherosclerotic heart disease of native coronary artery without angina pectoris: Secondary | ICD-10-CM | POA: Insufficient documentation

## 2022-11-19 DIAGNOSIS — Z79899 Other long term (current) drug therapy: Secondary | ICD-10-CM | POA: Diagnosis not present

## 2022-11-19 DIAGNOSIS — K429 Umbilical hernia without obstruction or gangrene: Secondary | ICD-10-CM | POA: Diagnosis not present

## 2022-11-19 DIAGNOSIS — J439 Emphysema, unspecified: Secondary | ICD-10-CM | POA: Insufficient documentation

## 2022-11-19 DIAGNOSIS — K420 Umbilical hernia with obstruction, without gangrene: Secondary | ICD-10-CM | POA: Diagnosis not present

## 2022-11-19 DIAGNOSIS — Z8546 Personal history of malignant neoplasm of prostate: Secondary | ICD-10-CM | POA: Insufficient documentation

## 2022-11-19 DIAGNOSIS — Z95810 Presence of automatic (implantable) cardiac defibrillator: Secondary | ICD-10-CM | POA: Diagnosis not present

## 2022-11-19 DIAGNOSIS — I428 Other cardiomyopathies: Secondary | ICD-10-CM | POA: Diagnosis not present

## 2022-11-19 DIAGNOSIS — I110 Hypertensive heart disease with heart failure: Secondary | ICD-10-CM | POA: Insufficient documentation

## 2022-11-19 DIAGNOSIS — Z923 Personal history of irradiation: Secondary | ICD-10-CM | POA: Diagnosis not present

## 2022-11-19 DIAGNOSIS — R7303 Prediabetes: Secondary | ICD-10-CM

## 2022-11-19 HISTORY — DX: Other cardiomyopathies: I42.8

## 2022-11-19 HISTORY — DX: Other long term (current) drug therapy: Z79.899

## 2022-11-19 HISTORY — PX: INSERTION OF MESH: SHX5868

## 2022-11-19 HISTORY — DX: Personal history of other infectious and parasitic diseases: Z86.19

## 2022-11-19 HISTORY — PX: UMBILICAL HERNIA REPAIR: SHX196

## 2022-11-19 HISTORY — DX: Umbilical hernia without obstruction or gangrene: K42.9

## 2022-11-19 HISTORY — DX: Hyperlipidemia, unspecified: E78.5

## 2022-11-19 HISTORY — DX: Unilateral inguinal hernia, without obstruction or gangrene, not specified as recurrent: K40.90

## 2022-11-19 HISTORY — DX: Other psychoactive substance abuse, in remission: F19.11

## 2022-11-19 HISTORY — DX: Other intervertebral disc degeneration, lumbar region: M51.36

## 2022-11-19 HISTORY — DX: Other intervertebral disc degeneration, lumbar region without mention of lumbar back pain or lower extremity pain: M51.369

## 2022-11-19 HISTORY — DX: Cardiomegaly: I51.7

## 2022-11-19 HISTORY — DX: Male erectile dysfunction, unspecified: N52.9

## 2022-11-19 LAB — GLUCOSE, CAPILLARY: Glucose-Capillary: 112 mg/dL — ABNORMAL HIGH (ref 70–99)

## 2022-11-19 SURGERY — HERNIORRHAPHY, INGUINAL, ROBOT-ASSISTED, LAPAROSCOPIC
Anesthesia: General

## 2022-11-19 MED ORDER — ONDANSETRON HCL 4 MG/2ML IJ SOLN: 4.0000 mg | Freq: Once | INTRAMUSCULAR | Status: DC | PRN

## 2022-11-19 MED ORDER — ACETAMINOPHEN 160 MG/5ML PO SOLN: 325.0000 mg | ORAL | Status: DC | PRN

## 2022-11-19 MED ORDER — SUGAMMADEX SODIUM 500 MG/5ML IV SOLN
INTRAVENOUS | Status: AC
  Filled 2022-11-19: qty 5

## 2022-11-19 MED ORDER — ACETAMINOPHEN 500 MG PO TABS: 1000.0000 mg | ORAL_TABLET | ORAL | Status: AC

## 2022-11-19 MED ORDER — SEVOFLURANE IN SOLN
RESPIRATORY_TRACT | Status: AC
  Filled 2022-11-19: qty 250

## 2022-11-19 MED ORDER — BUPIVACAINE LIPOSOME 1.3 % IJ SUSP
INTRAMUSCULAR | Status: DC | PRN
  Administered 2022-11-19: 20 mL

## 2022-11-19 MED ORDER — CHLORHEXIDINE GLUCONATE 0.12 % MT SOLN: 15.0000 mL | Freq: Once | OROMUCOSAL | Status: AC

## 2022-11-19 MED ORDER — PHENYLEPHRINE 80 MCG/ML (10ML) SYRINGE FOR IV PUSH (FOR BLOOD PRESSURE SUPPORT)
PREFILLED_SYRINGE | INTRAVENOUS | Status: DC | PRN
  Administered 2022-11-19: 80 ug via INTRAVENOUS

## 2022-11-19 MED ORDER — HYDROCODONE-ACETAMINOPHEN 5-325 MG PO TABS: 1.0000 | ORAL_TABLET | ORAL | 0 refills | Status: DC | PRN

## 2022-11-19 MED ORDER — ACETAMINOPHEN 500 MG PO TABS
ORAL_TABLET | ORAL | Status: AC
  Administered 2022-11-19: 1000 mg via ORAL
  Filled 2022-11-19: qty 2

## 2022-11-19 MED ORDER — ONDANSETRON HCL 4 MG/2ML IJ SOLN
INTRAMUSCULAR | Status: DC | PRN
  Administered 2022-11-19: 4 mg via INTRAVENOUS

## 2022-11-19 MED ORDER — SUGAMMADEX SODIUM 200 MG/2ML IV SOLN
INTRAVENOUS | Status: DC | PRN
  Administered 2022-11-19: 200 mg via INTRAVENOUS

## 2022-11-19 MED ORDER — DEXMEDETOMIDINE HCL IN NACL 80 MCG/20ML IV SOLN
INTRAVENOUS | Status: AC
  Filled 2022-11-19: qty 20

## 2022-11-19 MED ORDER — DEXAMETHASONE SODIUM PHOSPHATE 10 MG/ML IJ SOLN
INTRAMUSCULAR | Status: AC
  Filled 2022-11-19: qty 1

## 2022-11-19 MED ORDER — FENTANYL CITRATE (PF) 100 MCG/2ML IJ SOLN: 25.0000 ug | INTRAMUSCULAR | Status: DC | PRN

## 2022-11-19 MED ORDER — DROPERIDOL 2.5 MG/ML IJ SOLN
INTRAMUSCULAR | Status: AC
  Administered 2022-11-19: 0.625 mg via INTRAVENOUS
  Filled 2022-11-19: qty 2

## 2022-11-19 MED ORDER — CHLORHEXIDINE GLUCONATE CLOTH 2 % EX PADS: 6.0000 | MEDICATED_PAD | Freq: Once | CUTANEOUS | Status: DC

## 2022-11-19 MED ORDER — EPHEDRINE 5 MG/ML INJ
INTRAVENOUS | Status: AC
  Filled 2022-11-19: qty 5

## 2022-11-19 MED ORDER — FENTANYL CITRATE (PF) 100 MCG/2ML IJ SOLN
INTRAMUSCULAR | Status: AC
  Filled 2022-11-19: qty 2

## 2022-11-19 MED ORDER — CHLORHEXIDINE GLUCONATE 0.12 % MT SOLN
OROMUCOSAL | Status: AC
  Administered 2022-11-19: 15 mL via OROMUCOSAL
  Filled 2022-11-19: qty 15

## 2022-11-19 MED ORDER — KETAMINE HCL 50 MG/5ML IJ SOSY
PREFILLED_SYRINGE | INTRAMUSCULAR | Status: AC
  Filled 2022-11-19: qty 5

## 2022-11-19 MED ORDER — GABAPENTIN 300 MG PO CAPS: 300.0000 mg | ORAL_CAPSULE | ORAL | Status: AC

## 2022-11-19 MED ORDER — MEPERIDINE HCL 25 MG/ML IJ SOLN: 6.2500 mg | INTRAMUSCULAR | Status: DC | PRN

## 2022-11-19 MED ORDER — FENTANYL CITRATE (PF) 100 MCG/2ML IJ SOLN
INTRAMUSCULAR | Status: DC | PRN
  Administered 2022-11-19 (×2): 100 ug via INTRAVENOUS

## 2022-11-19 MED ORDER — DEXMEDETOMIDINE HCL IN NACL 200 MCG/50ML IV SOLN
INTRAVENOUS | Status: DC | PRN
  Administered 2022-11-19: 4 ug via INTRAVENOUS
  Administered 2022-11-19: 8 ug via INTRAVENOUS

## 2022-11-19 MED ORDER — ROCURONIUM BROMIDE 10 MG/ML (PF) SYRINGE
PREFILLED_SYRINGE | INTRAVENOUS | Status: AC
  Filled 2022-11-19: qty 10

## 2022-11-19 MED ORDER — KETAMINE HCL 10 MG/ML IJ SOLN
INTRAMUSCULAR | Status: DC | PRN
  Administered 2022-11-19: 20 mg via INTRAVENOUS

## 2022-11-19 MED ORDER — ACETAMINOPHEN 325 MG PO TABS: 325.0000 mg | ORAL_TABLET | ORAL | Status: DC | PRN

## 2022-11-19 MED ORDER — BUPIVACAINE HCL 0.25 % IJ SOLN
INTRAMUSCULAR | Status: DC | PRN
  Administered 2022-11-19 (×2): 30 mL via INTRAMUSCULAR

## 2022-11-19 MED ORDER — BUPIVACAINE HCL (PF) 0.25 % IJ SOLN
INTRAMUSCULAR | Status: AC
  Filled 2022-11-19: qty 30

## 2022-11-19 MED ORDER — ORAL CARE MOUTH RINSE: 15.0000 mL | Freq: Once | OROMUCOSAL | Status: AC

## 2022-11-19 MED ORDER — CELECOXIB 200 MG PO CAPS: 200.0000 mg | ORAL_CAPSULE | ORAL | Status: AC

## 2022-11-19 MED ORDER — GABAPENTIN 300 MG PO CAPS
ORAL_CAPSULE | ORAL | Status: AC
  Administered 2022-11-19: 300 mg via ORAL
  Filled 2022-11-19: qty 1

## 2022-11-19 MED ORDER — CEFAZOLIN SODIUM-DEXTROSE 2-4 GM/100ML-% IV SOLN
INTRAVENOUS | Status: AC
  Filled 2022-11-19: qty 100

## 2022-11-19 MED ORDER — MIDAZOLAM HCL 2 MG/2ML IJ SOLN
INTRAMUSCULAR | Status: AC
  Filled 2022-11-19: qty 2

## 2022-11-19 MED ORDER — HYDROCODONE-ACETAMINOPHEN 7.5-325 MG PO TABS: 1.0000 | ORAL_TABLET | Freq: Once | ORAL | Status: DC | PRN

## 2022-11-19 MED ORDER — ROCURONIUM BROMIDE 100 MG/10ML IV SOLN
INTRAVENOUS | Status: DC | PRN
  Administered 2022-11-19: 50 mg via INTRAVENOUS
  Administered 2022-11-19: 30 mg via INTRAVENOUS

## 2022-11-19 MED ORDER — PROPOFOL 10 MG/ML IV BOLUS
INTRAVENOUS | Status: DC | PRN
  Administered 2022-11-19: 100 mg via INTRAVENOUS

## 2022-11-19 MED ORDER — ONDANSETRON HCL 4 MG/2ML IJ SOLN
INTRAMUSCULAR | Status: AC
  Filled 2022-11-19: qty 2

## 2022-11-19 MED ORDER — DEXAMETHASONE SODIUM PHOSPHATE 10 MG/ML IJ SOLN
INTRAMUSCULAR | Status: DC | PRN
  Administered 2022-11-19: 8 mg via INTRAVENOUS

## 2022-11-19 MED ORDER — LACTATED RINGERS IV SOLN: INTRAVENOUS | Status: DC

## 2022-11-19 MED ORDER — CEFAZOLIN SODIUM-DEXTROSE 2-4 GM/100ML-% IV SOLN
2.0000 g | INTRAVENOUS | Status: AC
  Administered 2022-11-19: 2 g via INTRAVENOUS

## 2022-11-19 MED ORDER — EPHEDRINE SULFATE (PRESSORS) 50 MG/ML IJ SOLN
INTRAMUSCULAR | Status: DC | PRN
  Administered 2022-11-19 (×3): 10 mg via INTRAVENOUS
  Administered 2022-11-19: 5 mg via INTRAVENOUS
  Administered 2022-11-19: 10 mg via INTRAVENOUS
  Administered 2022-11-19: 5 mg via INTRAVENOUS

## 2022-11-19 MED ORDER — LIDOCAINE HCL (CARDIAC) PF 100 MG/5ML IV SOSY
PREFILLED_SYRINGE | INTRAVENOUS | Status: DC | PRN
  Administered 2022-11-19: 100 mg via INTRAVENOUS

## 2022-11-19 MED ORDER — PROPOFOL 10 MG/ML IV BOLUS
INTRAVENOUS | Status: AC
  Filled 2022-11-19: qty 20

## 2022-11-19 MED ORDER — OXYCODONE HCL 5 MG PO TABS: 5.0000 mg | ORAL_TABLET | Freq: Once | ORAL | Status: AC

## 2022-11-19 MED ORDER — MIDAZOLAM HCL 2 MG/2ML IJ SOLN
INTRAMUSCULAR | Status: DC | PRN
  Administered 2022-11-19: 2 mg via INTRAVENOUS

## 2022-11-19 MED ORDER — OXYCODONE HCL 5 MG PO TABS
ORAL_TABLET | ORAL | Status: AC
  Administered 2022-11-19: 5 mg via ORAL
  Filled 2022-11-19: qty 1

## 2022-11-19 MED ORDER — CELECOXIB 200 MG PO CAPS
ORAL_CAPSULE | ORAL | Status: AC
  Administered 2022-11-19: 200 mg via ORAL
  Filled 2022-11-19: qty 1

## 2022-11-19 MED ORDER — LIDOCAINE HCL (PF) 2 % IJ SOLN
INTRAMUSCULAR | Status: AC
  Filled 2022-11-19: qty 5

## 2022-11-19 MED ORDER — BUPIVACAINE LIPOSOME 1.3 % IJ SUSP
INTRAMUSCULAR | Status: AC
  Filled 2022-11-19: qty 20

## 2022-11-19 MED ORDER — SUCCINYLCHOLINE CHLORIDE 200 MG/10ML IV SOSY
PREFILLED_SYRINGE | INTRAVENOUS | Status: AC
  Filled 2022-11-19: qty 10

## 2022-11-19 MED ORDER — DROPERIDOL 2.5 MG/ML IJ SOLN: 0.6250 mg | Freq: Once | INTRAMUSCULAR | Status: AC | PRN

## 2022-11-19 SURGICAL SUPPLY — 67 items
ADH SKN CLS APL DERMABOND .7 (GAUZE/BANDAGES/DRESSINGS) ×3
APL PRP STRL LF DISP 70% ISPRP (MISCELLANEOUS) ×3
APPLIER CLIP 11 MED OPEN (CLIP)
APR CLP MED 11 20 MLT OPN (CLIP)
BLADE CLIPPER SURG (BLADE) ×3 IMPLANT
BLADE SURG 15 STRL LF DISP TIS (BLADE) ×3 IMPLANT
BLADE SURG 15 STRL SS (BLADE) ×3
CANNULA REDUC XI 12-8 STAPL (CANNULA) ×3
CANNULA REDUCER 12-8 DVNC XI (CANNULA) ×3 IMPLANT
CHLORAPREP W/TINT 26 (MISCELLANEOUS) ×3 IMPLANT
CLIP APPLIE 11 MED OPEN (CLIP) ×3 IMPLANT
COVER TIP SHEARS 8 DVNC (MISCELLANEOUS) ×3 IMPLANT
COVER TIP SHEARS 8MM DA VINCI (MISCELLANEOUS) ×3
COVER WAND RF STERILE (DRAPES) ×3 IMPLANT
DERMABOND ADVANCED .7 DNX12 (GAUZE/BANDAGES/DRESSINGS) ×3 IMPLANT
DRAPE ARM DVNC X/XI (DISPOSABLE) ×9 IMPLANT
DRAPE COLUMN DVNC XI (DISPOSABLE) ×3 IMPLANT
DRAPE DA VINCI XI ARM (DISPOSABLE) ×9
DRAPE DA VINCI XI COLUMN (DISPOSABLE) ×3
DRAPE INCISE IOBAN 66X45 STRL (DRAPES) ×3 IMPLANT
DRAPE LAPAROTOMY 77X122 PED (DRAPES) ×3 IMPLANT
DRSG TELFA 3X8 NADH STRL (GAUZE/BANDAGES/DRESSINGS) ×3 IMPLANT
ELECT CAUTERY BLADE 6.4 (BLADE) ×3 IMPLANT
ELECT REM PT RETURN 9FT ADLT (ELECTROSURGICAL) ×3
ELECTRODE REM PT RTRN 9FT ADLT (ELECTROSURGICAL) ×3 IMPLANT
GAUZE 4X4 16PLY ~~LOC~~+RFID DBL (SPONGE) ×3 IMPLANT
GLOVE BIO SURGEON STRL SZ7 (GLOVE) ×12 IMPLANT
GOWN STRL REUS W/ TWL LRG LVL3 (GOWN DISPOSABLE) ×12 IMPLANT
GOWN STRL REUS W/TWL LRG LVL3 (GOWN DISPOSABLE) ×12
IRRIGATION STRYKERFLOW (MISCELLANEOUS) ×3 IMPLANT
IRRIGATOR STRYKERFLOW (MISCELLANEOUS) ×3
IV NS 1000ML (IV SOLUTION)
IV NS 1000ML BAXH (IV SOLUTION) IMPLANT
KIT PINK PAD W/HEAD ARE REST (MISCELLANEOUS) ×3
KIT PINK PAD W/HEAD ARM REST (MISCELLANEOUS) ×3 IMPLANT
LABEL OR SOLS (LABEL) ×3 IMPLANT
MANIFOLD NEPTUNE II (INSTRUMENTS) ×3 IMPLANT
MESH 3DMAX 5X7 LT XLRG (Mesh General) IMPLANT
MESH 3DMAX 5X7 RT XLRG (Mesh General) IMPLANT
MESH VENTRALEX ST 2.5 CRC MED (Mesh General) ×3 IMPLANT
NEEDLE HYPO 22GX1.5 SAFETY (NEEDLE) ×3 IMPLANT
NS IRRIG 500ML POUR BTL (IV SOLUTION) ×3 IMPLANT
OBTURATOR OPTICAL STANDARD 8MM (TROCAR) ×3
OBTURATOR OPTICAL STND 8 DVNC (TROCAR) ×3
OBTURATOR OPTICALSTD 8 DVNC (TROCAR) ×3 IMPLANT
PACK BASIN MINOR ARMC (MISCELLANEOUS) ×3 IMPLANT
PACK LAP CHOLECYSTECTOMY (MISCELLANEOUS) ×3 IMPLANT
PENCIL SMOKE EVACUATOR (MISCELLANEOUS) ×3 IMPLANT
SEAL CANN UNIV 5-8 DVNC XI (MISCELLANEOUS) ×6 IMPLANT
SEAL XI 5MM-8MM UNIVERSAL (MISCELLANEOUS) ×6
SET TUBE SMOKE EVAC HIGH FLOW (TUBING) ×3 IMPLANT
SOLUTION ELECTROLUBE (MISCELLANEOUS) ×3 IMPLANT
SPONGE T-LAP 18X18 ~~LOC~~+RFID (SPONGE) ×3 IMPLANT
STAPLER CANNULA SEAL DVNC XI (STAPLE) ×3 IMPLANT
STAPLER CANNULA SEAL XI (STAPLE) ×3
SUT ETHIBOND NAB MO 7 #0 18IN (SUTURE) ×3 IMPLANT
SUT MNCRL AB 4-0 PS2 18 (SUTURE) ×3 IMPLANT
SUT V-LOC 90 ABS 3-0 VLT  V-20 (SUTURE) ×6
SUT V-LOC 90 ABS 3-0 VLT V-20 (SUTURE) ×6 IMPLANT
SUT VIC AB 3-0 SH 27 (SUTURE) ×3
SUT VIC AB 3-0 SH 27X BRD (SUTURE) ×3 IMPLANT
SUT VICRYL 0 UR6 27IN ABS (SUTURE) ×6 IMPLANT
SYR 20ML LL LF (SYRINGE) ×3 IMPLANT
SYR 30ML LL (SYRINGE) ×3 IMPLANT
TAPE TRANSPORE STRL 2 31045 (GAUZE/BANDAGES/DRESSINGS) ×3 IMPLANT
TRAP FLUID SMOKE EVACUATOR (MISCELLANEOUS) ×3 IMPLANT
WATER STERILE IRR 500ML POUR (IV SOLUTION) ×3 IMPLANT

## 2022-11-19 NOTE — Discharge Instructions (Addendum)
Laparoscopic Inguinal Hernia Repair, Adult, Care After The following information offers guidance on how to care for yourself after your procedure. Your health care provider may also give you more specific instructions. If you have problems or questions, contact your health care provider. What can I expect after the procedure? After the procedure, it is common to have: Pain. Swelling and bruising around the incision area. Scrotal swelling, in males. Some fluid or blood draining from your incisions. Follow these instructions at home: Medicines Take over-the-counter and prescription medicines only as told by your health care provider. Ask your health care provider if the medicine prescribed to you: Requires you to avoid driving or using machinery. Can cause constipation. You may need to take these actions to prevent or treat constipation: Drink enough fluid to keep your urine pale yellow. Take over-the-counter or prescription medicines. Eat foods that are high in fiber, such as beans, whole grains, and fresh fruits and vegetables. Limit foods that are high in fat and processed sugars, such as fried or sweet foods. Incision care  Follow instructions from your health care provider about how to take care of your incisions. Make sure you: Wash your hands with soap and water for at least 20 seconds before and after you change your bandage (dressing). If soap and water are not available, use hand sanitizer. Change your dressing as told by your health care provider. Leave stitches (sutures), skin glue, or adhesive strips in place. These skin closures may need to stay in place for 2 weeks or longer. If adhesive strip edges start to loosen and curl up, you may trim the loose edges. Do not remove adhesive strips completely unless your health care provider tells you to do that. Check your incision area every day for signs of infection. Check for: More redness, swelling, or pain. More fluid or  blood. Warmth. Pus or a bad smell. Wear loose, soft clothing while your incisions heal. Managing pain and swelling  If directed, put ice on the painful or swollen areas. To do this: Put ice in a plastic bag. Place a towel between your skin and the bag. Leave the ice on for 20 minutes, 2-3 times a day. Remove the ice if your skin turns bright red. This is very important. If you cannot feel pain, heat, or cold, you have a greater risk of damage to the area.   Activity Do not lift anything that is heavier than 10 lb (4.5 kg), or the limit that you are told, until your health care provider says that it is safe. Ask your health care provider what activities are safe for you. A lot of activity during the first week after surgery can increase pain and swelling. For 1 week after your procedure: Avoid activities that take a lot of effort, such as exercise or sports. You may walk and climb stairs as needed for daily activity, but avoid long walks or climbing stairs for exercise. General instructions If you were given a sedative during the procedure, it can affect you for several hours. Do not drive or operate machinery until your health care provider says that it is safe. Do not take baths, swim, or use a hot tub until your health care provider approves. Ask your health care provider if you may take showers. You may only be allowed to take sponge baths. Do not use any products that contain nicotine or tobacco. These products include cigarettes, chewing tobacco, and vaping devices, such as e-cigarettes. If you need help quitting, ask  your health care provider. Keep all follow-up visits. This is important. Contact a health care provider if: You have any of these signs of infection: More redness, swelling, or pain around your incisions or your groin area. More fluid or blood coming from an incision. Warmth coming from an incision. Pus or a bad smell coming from an incision. A fever or chills. You  have more swelling in your scrotum, if you are male. You have severe pain and medicines do not help. You have abdominal pain or swelling. You cannot urinate or have a bowel movement. You faint or feel dizzy. You have nausea and vomiting. Get help right away if: You have redness, warmth, or pain in your leg. You have chest pain. You have problems breathing. These symptoms may represent a serious problem that is an emergency. Do not wait to see if the symptoms will go away. Get medical help right away. Call your local emergency services (911 in the [place] Do not drive yourself to the hospital. Summary Pain, swelling, and bruising are common after the procedure. Check your incision area every day for signs of infection, such as more redness, swelling, or pain. Put ice on painful or swollen areas for 20 minutes, 2-3 times a day. This information is not intended to replace advice given to you by your health care provider. Make sure you discuss any questions you have with your health care provider. Document Revised: 05/30/2020 Document Reviewed: 05/30/2020 Elsevier Patient Education  [facility] [place]   The drugs that you were given will stay in your system until tomorrow so for the next 24 hours you should not:  Drive an automobile Make any legal decisions Drink any alcoholic beverage   You may resume regular meals tomorrow.  Today it is better to start with liquids and gradually work up to solid foods.  You may eat anything you prefer, but it is better to start with liquids, then soup and crackers, and gradually work up to solid foods.   Please notify your doctor immediately if you have any unusual bleeding, trouble breathing, redness and pain at the surgery site, drainage, fever, or pain not relieved by medication.    Additional Instructions:  Please contact your physician with any problems or Same Day Surgery at [phone], Monday  through Friday 6 am to 4 pm, or [place] at [facility] number at [phone].

## 2022-11-19 NOTE — Interval H&P Note (Signed)
History and Physical Interval Note:  11/19/2022 7:23 AM  Ruben Duffy  has presented today for surgery, with the diagnosis of inguinal hernia, umbilical hernia.  The various methods of treatment have been discussed with the patient and family. After consideration of risks, benefits and other options for treatment, the patient has consented to  Procedure(s): XI ROBOTIC ASSISTED INGUINAL HERNIA, possible bilateral (Left) HERNIA REPAIR UMBILICAL ADULT (N/A) INSERTION OF MESH as a surgical intervention.  The patient's history has been reviewed, patient examined, no change in status, stable for surgery.  I have reviewed the patient's chart and labs.  Questions were answered to the patient's satisfaction.     [place]

## 2022-11-19 NOTE — Anesthesia Preprocedure Evaluation (Signed)
Anesthesia Evaluation  Patient identified by MRN, date of birth, ID band Patient awake    Reviewed: Allergy & Precautions, NPO status , Patient's Chart, lab work & pertinent test results  Airway Mallampati: III  TM Distance: >3 FB Neck ROM: full    Dental  (+) Chipped, Missing   Pulmonary COPD, former smoker   Pulmonary exam normal        Cardiovascular hypertension, + CAD and +CHF  negative cardio ROS Normal cardiovascular exam+ dysrhythmias + Cardiac Defibrillator   Most recent TTE was performed on 08/08/2022 revealing a moderately reduced left ventricular systolic function with an EF of 30-35%.  There was global hypokinesis. Left ventricular diastolic Doppler parameters consistent with abnormal relaxation (G1DD).  Right ventricular size and function normal.  There was trivial mitral valve regurgitation.  All transvalvular gradients were noted to be normal providing no evidence suggestive of valvular stenosis.  Per cardiology, "based ACC/AHA guidelines, the patient's past medical history, and the amount of time since his last clinic visit, this patient would be at an overall MODERATE risk for the planned procedure without further cardiovascular testing or intervention at this time".   Neuro/Psych negative neurological ROS  negative psych ROS   GI/Hepatic Neg liver ROS,GERD  Controlled,,  Endo/Other  negative endocrine ROS    Renal/GU      Musculoskeletal   Abdominal   Peds  Hematology negative hematology ROS (+)   Anesthesia Other Findings Past Medical History: 04/08/2022: AICD (automatic cardioverter/defibrillator) present     Comment:  a.) [employer]ura CRT-D No date: Arthritis 09/10/2021: CAD (coronary artery disease)     Comment:  a.) R/LHC 09/10/2021: 30% pLCx - med mgmt. No date: Cardiomegaly No date: CHF (congestive heart failure) ([place])     Comment:  a.) TTE 09/07/2021: EF 25-30%, diff inf HK, mod  LV dil,               mod LAE, mod-sev MR; b.) R/LHC 09/10/2021: EF 25%, mPA               13, PCWP 5, CO 6.2, CI 3.1; c.) cMRI 09/11/2021: EF 14%,               sev LV dil, diff HK; d.) TTE 01/11/2022: EF 25-30%, glob               HK, mod LV dil, mod LAE, mild MR, G2DD; e.) CRT-D               implanted 04/08/2022; f.) TTE 08/08/2022: EF 30-35%, glob              HK, triv MR, G1DD No date: DDD (degenerative disc disease), lumbar No date: Emphysema of lung (HCC) No date: Erectile dysfunction     Comment:  a.) on PDE5i (sildenafil) PRN No date: GERD (gastroesophageal reflux disease) No date: Hepatitis C virus infection cured after antiviral drug  therapy No date: History of substance abuse ([place])     Comment:  a.) THC + crack cocaine No date: HLD (hyperlipidemia) No date: Hypertension No date: Hypospadias, balanic 04/26/2021: LBBB (left bundle branch block)     Comment:  a.) noted on preop ECG 04/26/2021 No date: Left inguinal hernia No date: Long term current use of amiodarone No date: Lower urinary tract symptoms (LUTS) No date: NICM (nonischemic cardiomyopathy) ([place])     Comment:  a.) TTE 09/07/2021: EF 25-30%; b.) R/LHC 09/10/2021: mPA  13, PCWP 5, CO 6.2, CI 3.1; c.) cMRI 09/11/2021: EF 14%,               diff HK, RV insertion site LGE; d.) TTE 01/11/2022: EF               25-30% e.) CRT-D implanted 04/08/2022; f.) TTE               08/08/2022: EF 30-35% No date: Pre-diabetes 01/08/2021: Prostate cancer ([place])     Comment:  a.) Gleason 3+4, T2a; b.) s/p brachytherapy 25/42/7062 No date: Umbilical hernia  Past Surgical History: No date: BIOPSY PROSTATE 04/08/2022: BIV ICD INSERTION CRT-D; N/A     Comment:  Procedure: BIV ICD INSERTION CRT-D;  Surgeon: Deboraha Sprang, MD;  Location: [facility];  Service:               Cardiovascular;  Laterality: N/A; 06/05/2021: CIRCUMCISION; N/A     Comment:  Procedure: CIRCUMCISION ADULT;  Surgeon: Irine Seal,               MD;  Location: [facility];  Service:               Urology;  Laterality: N/A; No date: HEMORRHOID SURGERY No date: HYPOSPADIAS CORRECTION 06/05/2021: RADIOACTIVE SEED IMPLANT; N/A     Comment:  Procedure: RADIOACTIVE SEED IMPLANT/BRACHYTHERAPY               IMPLANT;  Surgeon: Irine Seal, MD;  Location: [facility];  Service: Urology;  Laterality: N/A; 09/10/2021: RIGHT/LEFT HEART CATH AND CORONARY ANGIOGRAPHY; N/A     Comment:  Procedure: RIGHT/LEFT HEART CATH AND CORONARY               ANGIOGRAPHY;  Surgeon: Jolaine Artist, MD;                Location: [facility];  Service: Cardiovascular;                Laterality: N/A; 06/05/2021: SPACE OAR INSTILLATION; N/A     Comment:  Procedure: SPACE OAR INSTILLATION;  Surgeon: Irine Seal, MD;  Location: [place];                Service: Urology;  Laterality: N/A;  BMI    Body Mass Index: 25.54 kg/m      Reproductive/Obstetrics negative OB ROS                             Anesthesia Physical Anesthesia Plan  ASA: 3  Anesthesia Plan: General ETT   Post-op Pain Management:    Induction: Intravenous  PONV Risk Score and Plan: Ondansetron, Dexamethasone, Midazolam and Treatment may vary due to age or medical condition  Airway Management Planned: Oral ETT  Additional Equipment:   Intra-op Plan:   Post-operative Plan: Extubation in OR  Informed Consent: I have reviewed the patients History and Physical, chart, labs and discussed the procedure including the risks, benefits and alternatives for the proposed anesthesia with the patient or authorized representative who has indicated his/her understanding and acceptance.     Dental Advisory Given  Plan Discussed with: Anesthesiologist, CRNA and  Surgeon  Anesthesia Plan Comments: (Patient consented for risks of anesthesia including but not limited to:   - adverse reactions to medications - damage to eyes, teeth, lips or other oral mucosa - nerve damage due to positioning  - sore throat or hoarseness - Damage to heart, brain, nerves, lungs, other parts of body or loss of life  Patient voiced understanding.)       Anesthesia Quick Evaluation

## 2022-11-19 NOTE — Anesthesia Procedure Notes (Cosign Needed Addendum)
Procedure Name: Intubation Date/Time: 11/19/2022 7:45 AM  Performed by: Alphonsus Sias, MDPre-anesthesia Checklist: Patient identified, Emergency Drugs available, Suction available and Patient being monitored Patient Re-evaluated:Patient Re-evaluated prior to induction Oxygen Delivery Method: Circle system utilized Preoxygenation: Pre-oxygenation with 100% oxygen Induction Type: IV induction Ventilation: Mask ventilation without difficulty Laryngoscope Size: 3, McGraph and Mac Grade View: Grade I Tube type: Oral Tube size: 7.5 mm Number of attempts: 2 Airway Equipment and Method: Stylet and Cricothyrotomy Placement Confirmation: ETT inserted through vocal cords under direct vision, positive ETCO2 and breath sounds checked- equal and bilateral Secured at: 23 cm Tube secured with: Tape Dental Injury: Teeth and Oropharynx as per pre-operative assessment  Comments: 1st attempt with MAC with ETT in esophagus by SRNA, pt stable with o2 sats 100%, 2nd attempt with McGrath 3 with successful advancement by [facility]

## 2022-11-19 NOTE — Transfer of Care (Cosign Needed)
Immediate Anesthesia Transfer of Care Note  Patient: Ruben Duffy  Procedure(s) Performed: XI ROBOTIC ASSISTED INGUINAL HERNIA, possible bilateral (Bilateral) HERNIA REPAIR UMBILICAL ADULT INSERTION OF MESH  Patient Location: PACU  Anesthesia Type:General  Level of Consciousness: awake and drowsy  Airway & Oxygen Therapy: Patient Spontanous Breathing and Patient connected to face mask oxygen  Post-op Assessment: Report given to RN and Post -op Vital signs reviewed and stable  Post vital signs: stable  Last Vitals:  Vitals Value Taken Time  BP 156/101 11/19/22 1017  Temp 36.8 C 11/19/22 1017  Pulse 85 11/19/22 1019  Resp 18 11/19/22 1019  SpO2 98 % 11/19/22 1019  Vitals shown include unvalidated device data.  Last Pain:  Vitals:   11/19/22 0609  TempSrc: Temporal  PainSc: 0-No pain         Complications: No notable events documented.

## 2022-11-19 NOTE — Op Note (Signed)
Robotic assisted Laparoscopic Transabdominal bilateral Inguinal Hernia Repair with 3 D Xlarge Mesh Open Ventral hernia 3 cm incarcerated using 6.4 cms ventralex patch      Pre-operative Diagnosis:  Left and umbilical Inguinal Hernia   Post-operative Diagnosis: Same   Procedure: Robotic  Laparoscopic  repair of Bilateral inguinal hernias w X large 3 D mesh    Surgeon: Caroleen Hamman, MD FACS   Anesthesia: Gen. with endotracheal tube   Findings: Indirect a Bilateral  inguinal hernias, incarcerated 3 cm umbilical hernia        Procedure Details  The patient was seen again in the Holding Room. The benefits, complications, treatment options, and expected outcomes were discussed with the patient. The risks of bleeding, infection, recurrence of symptoms, failure to resolve symptoms, recurrence of hernia, ischemic orchitis, chronic pain syndrome or neuroma, were discussed again. The likelihood of improving the patient's symptoms with return to their baseline status is good.  The patient and/or family concurred with the proposed plan, giving informed consent.  The patient was taken to Operating Room, identified  and the procedure verified as Laparoscopic Inguinal Hernia Repair. Laterality confirmed.  A Time Out was held and the above information confirmed.   Prior to the induction of general anesthesia, antibiotic prophylaxis was administered. VTE prophylaxis was in place. General endotracheal anesthesia was then administered and tolerated well. After the induction, the abdomen was prepped with Chloraprep and draped in the sterile fashion. The patient was positioned in the supine position.     Supraumbilical incision created , hernia sac was dissected and there was chronic incarcerated omentum. The hernia sac was excised. Circumferential dissection perforomd and stay sutures placed. Fascia elevated and incised and hasson trochar placed. Pneumoperitoneum obtained w/o HD changes. No evidence of bowel  injuries.  Two 8 mm placed under direct vision. The laparoscopy revealed large indirect defects. The left contained chronically incarcerated sigmoid. I inserted the needles and the mesh. The robot was brought ot the table and docked in the standard fashion, no collision between arms was observed. Instruments were kept under direct view at all times. We started on the Left side were a flap was created. The sac was reduced and dissected free from adjacent structures. We preserved the vas and the vessels. Once dissection was completed a Xlarge 3D mesh was placed and secured with two interrupted vicryl attached to the pubic tubercle. There was good coverage of the direct, indirect and femoral spaces. The flap was closed with v lock suture.   Attention then was turned to the Right side where a peritoneal flap was created. The sac was reduced and dissected free from adjacent structures. We preserved the vas and the vessels. Once dissection was completed a large 3D mesh was placed and secured with two interrupted vicryl attached to the pubic tubercle. There was good coverage of the direct, indirect and femoral spaces. The flap was closed with v lock suture. Second look revealed no complications or injuries.     Once assuring that hemostasis was adequate the ports were removed and  liposomal marcaine was injected on all incision sites. I repaired the ventral hernia by placing a 6.4 cm mesh and placed 4 transfacial sutures to anchored the mesh. The mesh laid really nic against the abd wall. The defect was closed w interrupted ethibond sutures. 4-0 subcuticular Monocryl was used at all skin edges. Dermabond was placed.  Patient tolerated the procedure well. There were no complications. He was taken to the recovery room in stable  condition.        Caroleen Hamman, MD, FACS

## 2022-11-20 ENCOUNTER — Encounter: Payer: Self-pay | Admitting: Surgery

## 2022-11-20 LAB — SURGICAL PATHOLOGY

## 2022-11-21 NOTE — Anesthesia Postprocedure Evaluation (Signed)
Anesthesia Post Note  Patient: Charlis Guo  Procedure(s) Performed: XI ROBOTIC ASSISTED INGUINAL HERNIA, possible bilateral (Bilateral) HERNIA REPAIR UMBILICAL ADULT INSERTION OF MESH  Patient location during evaluation: PACU Anesthesia Type: General Level of consciousness: awake and alert Pain management: pain level controlled Vital Signs Assessment: post-procedure vital signs reviewed and stable Respiratory status: spontaneous breathing, nonlabored ventilation and respiratory function stable Cardiovascular status: blood pressure returned to baseline and stable Postop Assessment: no apparent nausea or vomiting Anesthetic complications: no   No notable events documented.   Last Vitals:  Vitals:   11/19/22 1100 11/19/22 1115  BP: (!) 146/61 (!) 150/76  Pulse: 83 70  Resp: (!) 25 18  Temp: 36.5 C (!) 36.1 C  SpO2: 92% 98%    Last Pain:  Vitals:   11/20/22 0909  TempSrc:   PainSc: 2                  Alphonsus Sias

## 2022-11-23 ENCOUNTER — Other Ambulatory Visit (HOSPITAL_COMMUNITY): Payer: Self-pay | Admitting: Family Medicine

## 2022-12-03 ENCOUNTER — Ambulatory Visit (INDEPENDENT_AMBULATORY_CARE_PROVIDER_SITE_OTHER): Payer: 59 | Admitting: Physician Assistant

## 2022-12-03 ENCOUNTER — Encounter: Payer: Self-pay | Admitting: Physician Assistant

## 2022-12-03 ENCOUNTER — Encounter: Payer: Self-pay | Admitting: Acute Care

## 2022-12-03 ENCOUNTER — Ambulatory Visit (INDEPENDENT_AMBULATORY_CARE_PROVIDER_SITE_OTHER): Payer: 59 | Admitting: Acute Care

## 2022-12-03 VITALS — BP 119/72 | HR 66 | Temp 97.8°F | Ht 68.0 in | Wt 169.0 lb

## 2022-12-03 DIAGNOSIS — Z87891 Personal history of nicotine dependence: Secondary | ICD-10-CM

## 2022-12-03 DIAGNOSIS — K4020 Bilateral inguinal hernia, without obstruction or gangrene, not specified as recurrent: Secondary | ICD-10-CM

## 2022-12-03 DIAGNOSIS — Z09 Encounter for follow-up examination after completed treatment for conditions other than malignant neoplasm: Secondary | ICD-10-CM

## 2022-12-03 DIAGNOSIS — K4090 Unilateral inguinal hernia, without obstruction or gangrene, not specified as recurrent: Secondary | ICD-10-CM

## 2022-12-03 NOTE — Patient Instructions (Signed)
Thank you for participating in the [facility]. It was our pleasure to meet you today. We will call you with the results of your scan within the next few days. Your scan will be assigned a Lung RADS category score by the physicians reading the scans.  This Lung RADS score determines follow up scanning.  See below for description of categories, and follow up screening recommendations. We will be in touch to schedule your follow up screening annually or based on recommendations of our providers. We will fax a copy of your scan results to your Primary Care Physician, or the physician who referred you to the program, to ensure they have the results. Please call the office if you have any questions or concerns regarding your scanning experience or results.  Our office number is [phone]. Please speak with Denise Phelps, RN. , or  Denise Buckner RN, They are  our Lung Cancer Screening RN.'s If They are unavailable when you call, Please leave a message on the voice mail. We will return your call at our earliest convenience.This voice mail is monitored several times a day.  Remember, if your scan is normal, we will scan you annually as long as you continue to meet the criteria for the program. (Age 50-80, Current smoker or smoker who has quit within the last 15 years). If you are a smoker, remember, quitting is the single most powerful action that you can take to decrease your risk of lung cancer and other pulmonary, breathing related problems. We know quitting is hard, and we are here to help.  Please let us know if there is anything we can do to help you meet your goal of quitting. If you are a former smoker, congratulations. We are proud of you! Remain smoke free! Remember you can refer friends or family members through the number above.  We will screen them to make sure they meet criteria for the program. Thank you for helping us take better care of you by  participating in Lung Screening.  You can receive free nicotine replacement therapy ( patches, gum or mints) by calling 1-800-QUIT NOW. Please call so we can get you on the path to becoming  a non-smoker. I know it is hard, but you can do this!  Lung RADS Categories:  Lung RADS 1: no nodules or definitely non-concerning nodules.  Recommendation is for a repeat annual scan in 12 months.  Lung RADS 2:  nodules that are non-concerning in appearance and behavior with a very low likelihood of becoming an active cancer. Recommendation is for a repeat annual scan in 12 months.  Lung RADS 3: nodules that are probably non-concerning , includes nodules with a low likelihood of becoming an active cancer.  Recommendation is for a 6-month repeat screening scan. Often noted after an upper respiratory illness. We will be in touch to make sure you have no questions, and to schedule your 6-month scan.  Lung RADS 4 A: nodules with concerning findings, recommendation is most often for a follow up scan in 3 months or additional testing based on our provider's assessment of the scan. We will be in touch to make sure you have no questions and to schedule the recommended 3 month follow up scan.  Lung RADS 4 B:  indicates findings that are concerning. We will be in touch with you to schedule additional diagnostic testing based on our provider's  assessment of the scan.  Other options for assistance in smoking cessation (   As covered by your insurance benefits)  Hypnosis for smoking cessation  [employer] [phone]  Acupuncture for smoking cessation  [facility] [phone]   

## 2022-12-03 NOTE — Progress Notes (Signed)
[facility] POST-OP OFFICE VISIT  12/03/2022  HPI: Ruben Duffy is a 57 y.o. male 14 days s/p robotic assisted laparoscopic bilateral inguinal hernia repair and open ventral hernia repair with Dr Dahlia Byes.   He has done well Second day was the worst regarding pain; now improving Soreness into scrotum No fever, chills, nausea, emesis, or bowel changes No issues with incisions No other complaints   Vital signs: BP 119/72   Pulse 66   Temp 97.8 F (36.6 C) (Oral)   Ht 5' 8"$  (1.727 m)   Wt 169 lb (76.7 kg)   SpO2 97%   BMI 25.70 kg/m    Physical Exam: Constitutional: Well appearing male, NAD Abdomen: Soft, non-tender, non-distended, no rebound/guarding Skin: Laparoscopic incisions are healing well, no erythema or drainage. There is healing ecchymosis to the right port site  Assessment/Plan: This is a 57 y.o. male 14 days s/p robotic assisted laparoscopic bilateral inguinal hernia repair and open ventral hernia repair with Dr Dahlia Byes.    - Pain control prn  - Reviewed wound care recommendation  - Reviewed lifting restrictions; 6 weeks total - work note given   - He can follow up on as needed basis; He understands to call with questions/concerns  -- Edison Simon, PA-C  [facility] 12/03/2022, 2:14 PM M-F: 7am - 4pm

## 2022-12-03 NOTE — Progress Notes (Signed)
Virtual Visit via Telephone Note  I connected with Ruben Duffy on 12/03/22 at  3:00 PM EST by telephone and verified that I am speaking with the correct person using two identifiers.  Location: Patient:  At home  Provider: [address], [place]   I discussed the limitations, risks, security and privacy concerns of performing an evaluation and management service by telephone and the availability of in person appointments. I also discussed with the patient that there may be a patient responsible charge related to this service. The patient expressed understanding and agreed to proceed.   Shared Decision Making Visit Lung Cancer Screening Program [phone])   Eligibility: Age 57 y.o. Pack Years Smoking History Calculation 50 pack year smoking history (# packs/per year x # years smoked) Recent History of coughing up blood  no Unexplained weight loss? no ( >Than 15 pounds within the last 6 months ) Prior History Lung / other cancer no (Diagnosis within the last 5 years already requiring surveillance chest CT Scans). Smoking Status Former Smoker Former Smokers: Years since quit: 15 years  Quit Date: 2009  Visit Components: Discussion included one or more decision making aids. yes Discussion included risk/benefits of screening. yes Discussion included potential follow up diagnostic testing for abnormal scans. yes Discussion included meaning and risk of over diagnosis. yes Discussion included meaning and risk of False Positives. yes Discussion included meaning of total radiation exposure. yes  Counseling Included: Importance of adherence to annual lung cancer LDCT screening. yes Impact of comorbidities on ability to participate in the program. yes Ability and willingness to under diagnostic treatment. yes  Smoking Cessation Counseling: Current Smokers:  Discussed importance of smoking cessation. yes Information about tobacco cessation classes and interventions provided to  patient. yes Patient provided with "ticket" for LDCT Scan. yes Symptomatic Patient. no  Counseling NA Diagnosis Code: Tobacco Use Z72.0 Asymptomatic Patient yes  Counseling (Intermediate counseling: > three minutes counseling) [id number] Former Smokers:  Discussed the importance of maintaining cigarette abstinence. yes Diagnosis Code: Personal History of Nicotine Dependence. [id number] Information about tobacco cessation classes and interventions provided to patient. Yes Patient provided with "ticket" for LDCT Scan. yes Written Order for Lung Cancer Screening with LDCT placed in Epic. Yes (CT Chest Lung Cancer Screening Low Dose W/O CM) [id number] Z12.2-Screening of respiratory organs Z87.891-Personal history of nicotine dependence  I spent 25 minutes of face to face time/virtual visit time  with  Ruben Duffy discussing the risks and benefits of lung cancer screening. We took the time to pause the power point at intervals to allow for questions to be asked and answered to ensure understanding. We discussed that he had taken the single most powerful action possible to decrease his risk of developing lung cancer when he quit smoking. I counseled him to remain smoke free, and to contact me if he ever had the desire to smoke again so that I can provide resources and tools to help support the effort to remain smoke free. We discussed the time and location of the scan, and that either  Doroteo Glassman RN, Joella Prince, RN or I  or I will call / send a letter with the results within  24-72 hours of receiving them. He has the office contact information in the event he needs to speak with me,  he verbalized understanding of all of the above and had no further questions upon leaving the office.     I explained to the patient that there has been a high  incidence of coronary artery disease noted on these exams. I explained that this is a non-gated exam therefore degree or severity cannot be determined. This patient is  on statin therapy. I have asked the patient to follow-up with their PCP regarding any incidental finding of coronary artery disease and management with diet or medication as they feel is clinically indicated. The patient verbalized understanding of the above and had no further questions.     Magdalen Spatz, NP 12/03/2022

## 2022-12-03 NOTE — Patient Instructions (Signed)

## 2022-12-04 NOTE — Progress Notes (Signed)
Advanced Heart Failure Clinic Note   Primary Care: Salem Nation, MD EP: Dr. Caryl Comes HF Cardiologist: Dr. 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  He was seen by Dr Gwenlyn Found 04/2021 for preop eval before radioactive seed implant and circumcision. He had LBBB of unknown duration, BP well-controlled. No further workup pre-procedure.  Admitted [id number] for a/c systolic HF. Echo EF 20-25%. LV dilated (6.5cm) with severe central MR. Global HK with ? AK of anteroseptum.  Diuresed with IV lasix. Initiated on GDMT with digoxin, spironolactone, Entresto and Farxiga. Frequent PVCs, started on amio w/ reduction in PVC burden. R/LHC showed minimal nonobstructive CAD, cMRI showed LVEF 14%, diffuse HK, mild/mod RV dilation w/ severe systolic dysfunction, RVEF 15%, small nonspecific basal inferoseptal RV insertion site LGE, mild elevation in ECV, not suggestive of cardiac amyloidosis. 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 LifeVest placed  Home sleep study 12/22 AHI 0.6/hr  Echo 01/11/22: EF 25-30% LV markedly dilated. Severe dyssynchrony. RV normal. Mild to moderate MR. Referred to EP for CRT-D.   Underwent CRT-D implant 6/23.   Follow up 10/23, still with NYHA II-III symptoms despite CRT-D, volume ok. Echo repeated, considering CPX if no better. Amio decreased to 100 daily.  Echo (10/23): EF 30-35%, moderate LV dysfunction with global HK, grade I DD, RV ok  Today he returns for HF follow up with his mother. Had laparoscopic bilateral inguinal hernia repair and open ventral hernia repair earlier this month and doing well. He has more energy and less SOB since CRT-D. He has SOB walking up steps but otherwise no issues. Denies palpitations, CP, dizziness, edema, or PND/Orthopnea. Appetite ok. No fever or chills. Weight at home 170 pounds. Taking all medications.     Cardiac  Studies:  - Echo (10/23): EF 30-35%, moderate LV dysfunction with global HK, grade I DD, RV ok  - Echo (3/23): EF 25-30%, LV markedly dilated, severe dyssynchrony, RV ok, mild to moderate MR.  - Cardiac MRI (11/22):  LVEF 14%, diffuse HK, mild/mod RV dilation w/ severe systolic dysfunction, RVEF 15%, small nonspecific basal inferoseptal RV insertion site LGE, mild elevation in ECV, not suggestive of cardiac amyloidosis.   - Echo (11/22): EF 20-25%. LV dilated (6.5cm) with severe central MR. Global HK with ? AK of anteroseptum   - RHC/LHC with minimal obstructive CAD, low filling pressure, normal output and severe NICM   ROS: All systems reviewed and negative except as per HPI.   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 Jude Quadra Assura CRT-D   Arthritis    CAD (coronary artery disease) 09/10/2021   a.) R/LHC 09/10/2021: 30% pLCx - med mgmt.   Cardiomegaly    CHF (congestive heart failure) ([place])    a.) TTE 09/07/2021: EF 25-30%, diff inf HK, mod LV dil, mod LAE, mod-sev MR; b.) [facility] 09/10/2021: EF 25%, mPA 13, PCWP 5, CO 6.2, CI 3.1; c.) cMRI 09/11/2021: EF 14%, sev LV dil, diff HK; d.) TTE 01/11/2022: EF 25-30%, glob HK, mod LV dil, mod LAE, mild MR, G2DD; e.) CRT-D implanted 04/08/2022; f.) TTE 08/08/2022: EF 30-35%, glob HK, triv MR, G1DD   DDD (degenerative disc disease), lumbar    Emphysema of lung (HCC)    Erectile dysfunction    a.) on PDE5i (sildenafil) PRN   GERD (gastroesophageal reflux  disease)    Hepatitis C virus infection cured after antiviral drug therapy    History of substance abuse ([place])    a.) THC + crack cocaine   HLD (hyperlipidemia)    Hypertension    Hypospadias, balanic    LBBB (left bundle branch block) 04/26/2021   a.) noted on preop ECG 04/26/2021   Left inguinal hernia    Long term current use of amiodarone    Lower urinary tract symptoms (LUTS)    NICM (nonischemic  cardiomyopathy) ([place])    a.) TTE 09/07/2021: EF 25-30%; b.) R/LHC 09/10/2021: mPA 13, PCWP 5, CO 6.2, CI 3.1; c.) cMRI 09/11/2021: EF 14%, diff HK, RV insertion site LGE; d.) TTE 01/11/2022: EF 25-30% e.) CRT-D implanted 04/08/2022; f.) TTE 08/08/2022: EF 30-35%   Pre-diabetes    Prostate cancer ([place]) 01/08/2021   a.) Gleason 3+4, T2a; b.) s/p brachytherapy [id number]   Umbilical hernia     Current Outpatient Medications  Medication Sig Dispense Refill   amiodarone (PACERONE) 200 MG tablet Take 0.5 tablets (100 mg total) by mouth daily. (Patient taking differently: Take 200 mg by mouth daily.) 90 tablet 3   atorvastatin (LIPITOR) 40 MG tablet Take 1 tablet (40 mg total) by mouth daily. 90 tablet 3   carvedilol (COREG) 3.125 MG tablet Take 1 tablet (3.125 mg total) by mouth 2 (two) times daily with a meal. 180 tablet 3   cyclobenzaprine (FLEXERIL) 10 MG tablet SMARTSIG:1 Tablet(s) By Mouth Every Evening PRN     DULoxetine (CYMBALTA) 60 MG capsule Take 60 mg by mouth daily.     FARXIGA 10 MG TABS tablet Take 1 tablet by mouth once daily 30 tablet 0   furosemide (LASIX) 20 MG tablet Take 60 mg by mouth daily.     MAGNESIUM-OXIDE 400 (240 Mg) MG tablet Take 1/2 (one-half) tablet by mouth once daily 30 tablet 0   Multiple Vitamin (MULTIVITAMIN ADULT PO) Take by mouth daily.     potassium chloride SA (KLOR-CON M) 20 MEQ tablet Take 1 tablet by mouth once daily 30 tablet 11   sacubitril-valsartan (ENTRESTO) 24-26 MG Take 1 tablet by mouth 2 (two) times daily. 180 tablet 3   sildenafil (REVATIO) 20 MG tablet TAKE 1 TO 5 TABLETS BY MOUTH AS NEEDED 30 tablet 11   silodosin (RAPAFLO) 8 MG CAPS capsule Take 1 capsule (8 mg total) by mouth daily with breakfast. 30 capsule 11   spironolactone (ALDACTONE) 25 MG tablet Take 1 tablet (25 mg total) by mouth daily. 90 tablet 3   traMADol (ULTRAM) 50 MG tablet Take 50 mg by mouth 2 (two) times daily as needed.     Vibegron (GEMTESA) 75 MG TABS Take 75 mg by  mouth daily. 84 tablet 0   No current facility-administered medications for this encounter.   No Known Allergies  Social History   Socioeconomic History   Marital status: Single    Spouse name: Not on file   Number of children: 1   Years of education: Not on file   Highest education level: Not on file  Occupational History   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  Tobacco Use   Smoking status: Former    Packs/day: 1.75    Years: 30.00    Total pack years: 52.50    Types: Cigarettes    Quit date: 2009    Years since quitting: 15.1   Smokeless tobacco: Never  Vaping Use   Vaping Use: Never used  Substance and Sexual Activity   Alcohol use: Not Currently   Drug use: Not Currently    Types: Marijuana, "Crack" cocaine   Sexual activity: Yes  Other Topics Concern   Not on file  Social History Narrative   Not on file   Social Determinants of Health   Financial Resource Strain: Low Risk  (07/27/2021)   Overall Financial Resource Strain (CARDIA)    Difficulty of Paying Living Expenses: Not hard at all  Food Insecurity: No Food Insecurity (07/27/2021)   Hunger Vital Sign    Worried About Running Out of Food in the Last Year: Never true    Ran Out of Food in the Last Year: Never true  Transportation Needs: No Transportation Needs (07/27/2021)   PRAPARE - Hydrologist (Medical): No    Lack of Transportation (Non-Medical): No  Physical Activity: Inactive (07/27/2021)   Exercise Vital Sign    Days of Exercise per Week: 0 days    Minutes of Exercise per Session: 0 min  Stress: No Stress Concern Present (07/27/2021)   [place]    Feeling of Stress : Not at all  Social Connections: Socially Isolated (07/27/2021)   Social Connection and Isolation Panel [NHANES]    Frequency of Communication with Friends and Family: Three times a week    Frequency of Social  Gatherings with Friends and Family: Three times a week    Attends Religious Services: Never    Active Member of Clubs or Organizations: No    Attends Archivist Meetings: Never    Marital Status: Never married  Intimate Partner Violence: Not At Risk (07/27/2021)   Humiliation, Afraid, Rape, and Kick questionnaire    Fear of Current or Ex-Partner: No    Emotionally Abused: No    Physically Abused: No    Sexually Abused: No    Family History  Problem Relation Age of Onset   Stroke Father    Lung cancer Maternal Aunt    Diabetes Maternal Uncle    Brain cancer Maternal Uncle    Lung cancer Maternal Uncle    Lung cancer Maternal Uncle    Cancer Paternal Grandmother    Diabetes Paternal Grandfather    Colon cancer Neg Hx    Esophageal cancer Neg Hx    Stomach cancer Neg Hx    Pancreatic cancer Neg Hx    Colon polyps Neg Hx    Rectal cancer Neg Hx    BP 114/68   Pulse 69   Wt 78.7 kg (173 lb 9.6 oz)   SpO2 97%   BMI 26.40 kg/m   Wt Readings from Last 3 Encounters:  12/06/22 78.7 kg (173 lb 9.6 oz)  12/03/22 76.7 kg (169 lb)  11/19/22 76.2 kg (168 lb)   PHYSICAL EXAM: General:  NAD. No resp difficulty, walked into clinic HEENT: Normal Neck: Supple. No JVD. Carotids 2+ bilat; no bruits. No lymphadenopathy or thryomegaly appreciated. Cor: PMI nondisplaced. Regular rate & rhythm. No rubs, gallops or murmurs. Lungs: Clear Abdomen: Soft, nontender, nondistended. No hepatosplenomegaly. No bruits or masses. Good bowel sounds. Extremities: No cyanosis, clubbing, rash, edema Neuro: Alert & oriented x 3, cranial nerves grossly intact. Moves all 4 extremities w/o difficulty. Affect pleasant.  ECG (personally reviewed): NSR 69 bpm, QTc 516 msec  Device interrogation: AT/AF burden 0%, PVCs <1%, BP >99%, CorVue recently elevated but back to baseline, 6 hr/day activity  ASSESSMENT & PLAN:  1.  Chronic Systolic HF - Echo (XX123456): EF 20-25%. LV dilated (6.5cm) with severe  central MR. Global HK with ? AK of anteroseptum  - RHC/LHC (11/22) with minimal obstructive CAD, low filling pressure, normal output and severe NICM - cMRI (11/22): LVEF 14%, diffuse HK, mild/mod RV dilation w/ severe systolic dysfunction, RVEF 15%, small nonspecific basal inferoseptal RV insertion site LGE, mild elevation in ECV, not suggestive of cardiac amyloidosis. - Echo (3/23): EF 25-30% LV markedly dilated. Severe dyssynchrony. RV normal. Mild to moderate MR  - Suspected LBBB CMP (+/- PVC CM), now s/p CRT-D 6/23 - Echo (10/23): EF 30-35%, RV ok - Improved NYHA II today, volume looks stable on exam and by device interrogation. - Continue Entresto 24/26 mg bid. - Continue Coreg 3.125 mg bid. - Continue Lasix 60 mg daily + 20 KCL daily. - Continue spiro 25 mg daily.   - Continue Farxiga 10 mg daily.  - Arrange CPX to quantify HF limitation. - Labs today.   2. Frequent PVCs - Continue amiodarone 100 mg daily (LFTs/TSH at baseline recent check).  - None on ECG today. - Sleep study normal   3. Severe mitral regurgitation - Functional. - Mild to moderate MR on echo 3/23. - Now trivial MR on echo 10/23   4. COPD - Quit smoking 2009. - Stable, no change.   5. Pre-Diabetes - A1C 6.0 - On Farxiga.   6. H/o prostate CA - s/p radioactive seeds   7. Snoring - Home sleep study (12/22) AHI 0.6/hr  Follow up in 4 months with Dr. Haroldine Laws. Arrange CPX soon.  [place] FNP-BC 12/06/22  10:47 AM

## 2022-12-05 ENCOUNTER — Telehealth: Payer: Self-pay | Admitting: *Deleted

## 2022-12-05 NOTE — Telephone Encounter (Signed)
Faxed FMLA to [employer] at [phone]

## 2022-12-06 ENCOUNTER — Ambulatory Visit (HOSPITAL_COMMUNITY)
Admission: RE | Admit: 2022-12-06 | Discharge: 2022-12-06 | Disposition: A | Discharge status: Home / Self Care | Payer: 59 | Source: Ambulatory Visit | Attending: Family Medicine | Admitting: Family Medicine

## 2022-12-06 ENCOUNTER — Encounter (HOSPITAL_COMMUNITY): Payer: Self-pay

## 2022-12-06 ENCOUNTER — Ambulatory Visit (HOSPITAL_COMMUNITY)
Admission: RE | Admit: 2022-12-06 | Discharge: 2022-12-06 | Disposition: A | Discharge status: Home / Self Care | Payer: 59 | Source: Ambulatory Visit | Attending: Acute Care | Admitting: Acute Care

## 2022-12-06 VITALS — BP 114/68 | HR 69 | Wt 173.6 lb

## 2022-12-06 DIAGNOSIS — R7303 Prediabetes: Secondary | ICD-10-CM | POA: Insufficient documentation

## 2022-12-06 DIAGNOSIS — J432 Centrilobular emphysema: Secondary | ICD-10-CM | POA: Diagnosis not present

## 2022-12-06 DIAGNOSIS — I5022 Chronic systolic (congestive) heart failure: Secondary | ICD-10-CM | POA: Diagnosis not present

## 2022-12-06 DIAGNOSIS — J449 Chronic obstructive pulmonary disease, unspecified: Secondary | ICD-10-CM | POA: Diagnosis not present

## 2022-12-06 DIAGNOSIS — I428 Other cardiomyopathies: Secondary | ICD-10-CM | POA: Diagnosis not present

## 2022-12-06 DIAGNOSIS — I340 Nonrheumatic mitral (valve) insufficiency: Secondary | ICD-10-CM | POA: Insufficient documentation

## 2022-12-06 DIAGNOSIS — I493 Ventricular premature depolarization: Secondary | ICD-10-CM | POA: Diagnosis not present

## 2022-12-06 DIAGNOSIS — Z7984 Long term (current) use of oral hypoglycemic drugs: Secondary | ICD-10-CM | POA: Diagnosis not present

## 2022-12-06 DIAGNOSIS — R0683 Snoring: Secondary | ICD-10-CM

## 2022-12-06 DIAGNOSIS — Z122 Encounter for screening for malignant neoplasm of respiratory organs: Secondary | ICD-10-CM

## 2022-12-06 DIAGNOSIS — I110 Hypertensive heart disease with heart failure: Secondary | ICD-10-CM | POA: Insufficient documentation

## 2022-12-06 DIAGNOSIS — Z8546 Personal history of malignant neoplasm of prostate: Secondary | ICD-10-CM | POA: Diagnosis not present

## 2022-12-06 DIAGNOSIS — Z95810 Presence of automatic (implantable) cardiac defibrillator: Secondary | ICD-10-CM | POA: Diagnosis not present

## 2022-12-06 DIAGNOSIS — R0602 Shortness of breath: Secondary | ICD-10-CM | POA: Insufficient documentation

## 2022-12-06 DIAGNOSIS — Z79899 Other long term (current) drug therapy: Secondary | ICD-10-CM | POA: Diagnosis not present

## 2022-12-06 DIAGNOSIS — Z87891 Personal history of nicotine dependence: Secondary | ICD-10-CM | POA: Diagnosis not present

## 2022-12-06 DIAGNOSIS — I447 Left bundle-branch block, unspecified: Secondary | ICD-10-CM | POA: Diagnosis not present

## 2022-12-06 LAB — BRAIN NATRIURETIC PEPTIDE: B Natriuretic Peptide: 62.3 pg/mL (ref 0.0–100.0)

## 2022-12-06 LAB — BASIC METABOLIC PANEL
Anion gap: 7 (ref 5–15)
BUN: 7 mg/dL (ref 6–20)
CO2: 28 mmol/L (ref 22–32)
Calcium: 9.3 mg/dL (ref 8.9–10.3)
Chloride: 102 mmol/L (ref 98–111)
Creatinine, Ser: 0.78 mg/dL (ref 0.61–1.24)
GFR, Estimated: >60 mL/min (ref >60)
Glucose, Bld: 91 mg/dL (ref 70–99)
Potassium: 4.1 mmol/L (ref 3.5–5.1)
Sodium: 137 mmol/L (ref 135–145)

## 2022-12-06 NOTE — Patient Instructions (Signed)
It was great to see you today! No medication changes are needed at this time.   Labs today We will only contact you if something comes back abnormal or we need to make some changes. Otherwise no news is good news!  Your physician has recommended that you have a cardiopulmonary stress test (CPX). CPX testing is a non-invasive measurement of heart and lung function. It replaces a traditional treadmill stress test. This type of test provides a tremendous amount of information that relates not only to your present condition but also for future outcomes. This test combines measurements of you ventilation, respiratory gas exchange in the lungs, electrocardiogram (EKG), blood pressure and physical response before, during, and following an exercise protocol.   Your physician wants you to follow-up in: 4 months with Dr Haroldine Laws. You will receive a reminder letter in the mail two months in advance. If you don't receive a letter, please call our office to schedule the follow-up appointment.   Do the following things EVERYDAY: Weigh yourself in the morning before breakfast. Write it down and keep it in a log. Take your medicines as prescribed Eat low salt foods--Limit salt (sodium) to 2000 mg per day.  Stay as active as you can everyday Limit all fluids for the day to less than 2 liters  At the [facility], you and your health needs are our priority. As part of our continuing mission to provide you with exceptional heart care, we have created designated Provider Care Teams. These Care Teams include your primary Cardiologist (physician) and Advanced Practice Providers (APPs- Physician Assistants and Nurse Practitioners) who all work together to provide you with the care you need, when you need it.   You may see any of the following providers on your designated Care Team at your next follow up: Dr Glori Bickers Dr Loralie Champagne Dr. Roxana Hires, NP Lyda Jester,  [place] [facility] [place], [place] Forestine Na, NP Audry Riles, PharmD   Please be sure to bring in all your medications bottles to every appointment.    Thank you for choosing [facility]

## 2022-12-26 ENCOUNTER — Encounter: Payer: Self-pay | Admitting: Physical Medicine and Rehabilitation

## 2023-01-07 ENCOUNTER — Ambulatory Visit (INDEPENDENT_AMBULATORY_CARE_PROVIDER_SITE_OTHER): Payer: 59

## 2023-01-07 DIAGNOSIS — I428 Other cardiomyopathies: Secondary | ICD-10-CM

## 2023-01-07 LAB — CUP PACEART REMOTE DEVICE CHECK
Battery Remaining Longevity: 82 mo
Battery Remaining Percentage: 88 %
Battery Voltage: 3.07 V
Brady Statistic AP VP Percent: 1 %
Brady Statistic AP VS Percent: 1 %
Brady Statistic AS VP Percent: 98 %
Brady Statistic AS VS Percent: 1 %
Brady Statistic RA Percent Paced: 1 %
Date Time Interrogation Session: 20240326040628
HighPow Impedance: 65 Ohm
HighPow Impedance: 65 Ohm
Implantable Lead Connection Status: 753985
Implantable Lead Connection Status: 753985
Implantable Lead Connection Status: 753985
Implantable Lead Implant Date: 20230626
Implantable Lead Implant Date: 20230626
Implantable Lead Implant Date: 20230626
Implantable Lead Location: 753858
Implantable Lead Location: 753859
Implantable Lead Location: 753860
Implantable Lead Model: 7122
Implantable Pulse Generator Implant Date: 20230626
Lead Channel Impedance Value: 490 Ohm
Lead Channel Impedance Value: 590 Ohm
Lead Channel Impedance Value: 680 Ohm
Lead Channel Pacing Threshold Amplitude: 0.5 V
Lead Channel Pacing Threshold Amplitude: 0.5 V
Lead Channel Pacing Threshold Amplitude: 1.75 V
Lead Channel Pacing Threshold Pulse Width: 0.5 ms
Lead Channel Pacing Threshold Pulse Width: 0.5 ms
Lead Channel Pacing Threshold Pulse Width: 0.5 ms
Lead Channel Sensing Intrinsic Amplitude: 12 mV
Lead Channel Sensing Intrinsic Amplitude: 2.8 mV
Lead Channel Setting Pacing Amplitude: 1.5 V
Lead Channel Setting Pacing Amplitude: 2 V
Lead Channel Setting Pacing Amplitude: 2.25 V
Lead Channel Setting Pacing Pulse Width: 0.5 ms
Lead Channel Setting Pacing Pulse Width: 0.5 ms
Lead Channel Setting Sensing Sensitivity: 0.5 mV
Pulse Gen Serial Number: 8949760

## 2023-01-15 ENCOUNTER — Ambulatory Visit (HOSPITAL_COMMUNITY): Payer: 59 | Attending: Internal Medicine

## 2023-01-15 DIAGNOSIS — I5022 Chronic systolic (congestive) heart failure: Secondary | ICD-10-CM | POA: Diagnosis not present

## 2023-02-06 ENCOUNTER — Encounter: Payer: Self-pay | Admitting: Physical Medicine and Rehabilitation

## 2023-02-07 ENCOUNTER — Encounter: Payer: Self-pay | Admitting: Orthopaedic Surgery

## 2023-02-07 ENCOUNTER — Ambulatory Visit: Payer: 59 | Admitting: Orthopaedic Surgery

## 2023-02-13 ENCOUNTER — Ambulatory Visit (INDEPENDENT_AMBULATORY_CARE_PROVIDER_SITE_OTHER): Payer: 59 | Admitting: Orthopaedic Surgery

## 2023-02-13 ENCOUNTER — Encounter: Payer: Self-pay | Admitting: Orthopaedic Surgery

## 2023-02-13 ENCOUNTER — Other Ambulatory Visit (INDEPENDENT_AMBULATORY_CARE_PROVIDER_SITE_OTHER): Payer: 59

## 2023-02-13 VITALS — Ht 68.0 in | Wt 170.0 lb

## 2023-02-13 DIAGNOSIS — M79642 Pain in left hand: Secondary | ICD-10-CM | POA: Diagnosis not present

## 2023-02-13 DIAGNOSIS — M154 Erosive (osteo)arthritis: Secondary | ICD-10-CM | POA: Diagnosis not present

## 2023-02-13 DIAGNOSIS — M79641 Pain in right hand: Secondary | ICD-10-CM | POA: Diagnosis not present

## 2023-02-13 NOTE — Addendum Note (Signed)
Addended by: Rogers Seeds on: 02/13/2023 04:59 PM   Modules accepted: Orders

## 2023-02-13 NOTE — Progress Notes (Addendum)
Office Visit Note   Patient: Ruben Duffy           Date of Birth: 1965/11/18           MRN: [id number] Visit Date: 02/13/2023              Requested by: Donetta Potts, MD [address] [place],  [place] [address] PCP: Donetta Potts, MD   Assessment & Plan: Visit Diagnoses:  1. Bilateral hand pain     Plan: Will obtain arthritis panel [facility]  and make appropriate rheumatologic referral.  Follow-Up Instructions: No follow-ups on file.   Orders:  Orders Placed This Encounter  Procedures   XR Hand Complete Left   XR Hand Complete Right   No orders of the defined types were placed in this encounter.     Procedures: No procedures performed   Clinical Data: No additional findings.   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  Past history of hepatitis which was treated with antiviral and resolution.  Discussed some heart disease heart failure nonischemic cardiomyopathy.  Wrist are painful sore he has some swelling.  No past history of gout no fever or chills.  Review of Systems no swallowing problems.  He has some easy bruisability.   no Raynaud's   Objective: Vital Signs: Ht 5\' 8"  (1.727 m)   Wt 170 lb (77.1 kg)   BMI 25.85 kg/m   Physical Exam Constitutional:      Appearance: He is well-developed.  HENT:     Head: Normocephalic and atraumatic.     Right Ear: External ear normal.     Left Ear: External ear normal.  Eyes:     Pupils: Pupils are equal, round, and reactive to light.  Neck:     Thyroid: No thyromegaly.     Trachea: No tracheal deviation.  Cardiovascular:     Rate and Rhythm: Normal rate.  Pulmonary:     Effort: Pulmonary effort is normal.     Breath sounds: No wheezing.  Abdominal:     General: Bowel sounds are normal.     Palpations: Abdomen is soft.  Musculoskeletal:      Cervical back: Neck supple.  Skin:    General: Skin is warm and dry.     Capillary Refill: Capillary refill takes less than 2 seconds.  Neurological:     Mental Status: He is alert and oriented to person, place, and time.  Psychiatric:        Behavior: Behavior normal.        Thought Content: Thought content normal.        Judgment: Judgment normal.     Ortho Exam patient is for percent wrist range of motion right and left tenderness radiocarpal joint and midcarpal joints.  PIPs DIPs MCP joints shows good range of motion no restriction.  Specialty Comments:  MRI LUMBAR SPINE WITHOUT CONTRAST   TECHNIQUE: Multiplanar, multisequence MR imaging of the lumbar spine was performed. No intravenous contrast was administered.   COMPARISON:  None Available.   FINDINGS: Segmentation:  Standard.   Alignment: Trace retrolisthesis at L1-L2. Trace anterolisthesis at L5-S1.   Vertebrae: Chronic anterior wedging of L1. Degenerative endplate irregularity at T12-L1: L1-L2, L3-L4, and L4-L5. Mild degenerative endplate marrow edema. Vertebral body hemangioma at L4.   Conus medullaris  and cauda equina: Conus extends to the L1 level. Conus and cauda equina appear normal.   Paraspinal and other soft tissues: Unremarkable.   Disc levels:   T12-L1: Minimal disc bulge.  No canal or foraminal stenosis.   L1-L2: Disc bulge with superimposed left foraminal protrusion and endplate osteophytic ridging. Minor facet arthropathy. No canal stenosis. No right foraminal stenosis. Mild to moderate left foraminal stenosis.   L2-L3: Disc bulge slightly eccentric to the right. Moderate facet arthropathy. No canal stenosis. Minor effacement of the right subarticular recess. No foraminal stenosis.   L3-L4: Disc bulge with superimposed right foraminal/far lateral protrusion and endplate osteophytic ridging. Moderate facet arthropathy with ligamentum flavum infolding. Mild canal stenosis. Partial  effacement of the right subarticular recess. Moderate right and minor left foraminal stenosis.   L4-L5:  Minor facet arthropathy.  No canal or foraminal stenosis.   L5-S1: Anterolisthesis with uncovering of disc bulge with superimposed left foraminal protrusion. Moderate to marked facet arthropathy with ligamentum flavum infolding. No canal stenosis. Minor right and marked left foraminal stenosis.   IMPRESSION: Multilevel degenerative changes as detailed above. Facet arthropathy is greatest at L2-L3, L3-L4, and L5-S1. No high-grade canal stenosis. Foraminal narrowing is greatest on the right at L3-L4 and on the left at L5-S1. Right subarticular recess narrowing is present at L3-L4.     Electronically Signed   By: Guadlupe Spanish M.D.   On: 03/08/2022 12:31  Imaging: XR Hand Complete Right  Result Date: 02/13/2023 Three-view x-rays right hand obtained and reviewed.  This shows less arthritic changes right wrist and left wrist with radial lunate abutment.  Some cystic changes in. Impression: Radiocarpal arthritis changes are more consistent with inflammatory arthropathy then posttraumatic.  Scaphoid appears intact.  XR Hand Complete Left  Result Date: 02/13/2023 Three-view x-rays left hand obtained and reviewed.  This shows radiocarpal arthritis with erosive changes multiple cystic changes throughout the carpal bones and distal radius and ulna.  Changes are more consistent with inflammatory arthropathy. Impression: Wrist and intercarpal arthritis with prominent subchondral cyst formation and radial lunate erosive changes.    PMFS History: Patient Active Problem List   Diagnosis Date Noted   Non-recurrent bilateral inguinal hernia without obstruction or gangrene 11/19/2022   Umbilical hernia without obstruction or gangrene 11/19/2022   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 07/11/2022   Primary osteoarthritis, right shoulder 03/07/2022   Chronic  systolic heart failure (HCC) 09/19/2021   New onset of congestive heart failure (HCC) 09/07/2021   Acute CHF (congestive heart failure) (HCC) 09/07/2021   Left bundle branch block 05/09/2021   Essential hypertension 05/09/2021   Preoperative clearance 05/09/2021   Malignant neoplasm of prostate (HCC) 02/23/2021   Past Medical History:  Diagnosis Date   AICD (automatic cardioverter/defibrillator) present 04/08/2022   a.) St. Jude Quadra Assura CRT-D   Arthritis    CAD (coronary artery disease) 09/10/2021   a.) R/LHC 09/10/2021: 30% pLCx - med mgmt.   Cardiomegaly    CHF (congestive heart failure) (HCC)    a.) TTE 09/07/2021: EF 25-30%, diff inf HK, mod LV dil, mod LAE, mod-sev MR; b.) [facility] 09/10/2021: EF 25%, mPA 13, PCWP 5, CO 6.2, CI 3.1; c.) cMRI 09/11/2021: EF 14%, sev LV dil, diff HK; d.) TTE 01/11/2022: EF 25-30%, glob HK, mod LV dil, mod LAE, mild MR, G2DD; e.) CRT-D implanted 04/08/2022; f.) TTE 08/08/2022: EF 30-35%, glob HK, triv MR, G1DD   DDD (degenerative disc disease), lumbar    Emphysema of lung (HCC)  Erectile dysfunction    a.) on PDE5i (sildenafil) PRN   GERD (gastroesophageal reflux disease)    Hepatitis C virus infection cured after antiviral drug therapy    History of substance abuse (HCC)    a.) THC + crack cocaine   HLD (hyperlipidemia)    Hypertension    Hypospadias, balanic    LBBB (left bundle branch block) 04/26/2021   a.) noted on preop ECG 04/26/2021   Left inguinal hernia    Long term current use of amiodarone    Lower urinary tract symptoms (LUTS)    NICM (nonischemic cardiomyopathy) (HCC)    a.) TTE 09/07/2021: EF 25-30%; b.) R/LHC 09/10/2021: mPA 13, PCWP 5, CO 6.2, CI 3.1; c.) cMRI 09/11/2021: EF 14%, diff HK, RV insertion site LGE; d.) TTE 01/11/2022: EF 25-30% e.) CRT-D implanted 04/08/2022; f.) TTE 08/08/2022: EF 30-35%   Pre-diabetes    Prostate cancer (HCC) 01/08/2021   a.) Gleason 3+4, T2a; b.) s/p brachytherapy 06/05/2021   Umbilical  hernia     Family History  Problem Relation Age of Onset   Stroke Father    Lung cancer Maternal Aunt    Diabetes Maternal Uncle    Brain cancer Maternal Uncle    Lung cancer Maternal Uncle    Lung cancer Maternal Uncle    Cancer Paternal Grandmother    Diabetes Paternal Grandfather    Colon cancer Neg Hx    Esophageal cancer Neg Hx    Stomach cancer Neg Hx    Pancreatic cancer Neg Hx    Colon polyps Neg Hx    Rectal cancer Neg Hx     Past Surgical History:  Procedure Laterality Date   BIOPSY PROSTATE     BIV ICD INSERTION CRT-D N/A 04/08/2022   Procedure: BIV ICD INSERTION CRT-D;  Surgeon: Duke Salvia, MD;  Location: [facility]ASIVE CV LAB;  Service: Cardiovascular;  Laterality: N/A;   CIRCUMCISION N/A 06/05/2021   Procedure: CIRCUMCISION ADULT;  Surgeon: Bjorn Pippin, MD;  Location[facility];  Service: Urology;  Laterality: N/A;   HEMORRHOID SURGERY     HYPOSPADIAS CORRECTION     INSERTION OF MESH  11/19/2022   Procedure: INSERTION OF MESH;  Surgeon: Leafy Ro, MD;  Location: [facility] ORS;  Service: General;;   RADIOACTIVE SEED IMPLANT N/A 06/05/2021   Procedure: RADIOACTIVE SEED IMPLANT/BRACHYTHERAPY IMPLANT;  Surgeon: Bjorn Pippin, MD;  Location[facility];  Service: Urology;  Laterality: N/A;   RIGHT/LEFT HEART CATH AND CORONARY ANGIOGRAPHY N/A 09/10/2021   Procedure: RIGHT/LEFT HEART CATH AND CORONARY ANGIOGRAPHY;  Surgeon: Dolores Patty, MD;  Location: [facility] INVASIVE CV LAB;  Service: Cardiovascular;  Laterality: N/A;   SPACE OAR INSTILLATION N/A 06/05/2021   Procedure: SPACE OAR INSTILLATION;  Surgeon: Bjorn Pippin, MD;  Location[facility];  Service: Urology;  Laterality: N/A;   UMBILICAL HERNIA REPAIR N/A 11/19/2022   Procedure: HERNIA REPAIR UMBILICAL ADULT;  Surgeon: Leafy Ro, MD;  Location: [facility] ORS;  Service: General;  Laterality: N/A;   Social History   Occupational History   Occupation: [employer]     Comment: Estate agent  Tobacco Use   Smoking status: Former    Packs/day: 1.75    Years: 30.00    Additional pack years: 0.00    Total pack years: 52.50    Types: Cigarettes    Quit date: 2009    Years since quitting: 15.3   Smokeless tobacco: Never  Vaping Use  Vaping Use: Never used  Substance and Sexual Activity   Alcohol use: Not Currently   Drug use: Not Currently    Types: Marijuana, "Crack" cocaine   Sexual activity: Yes

## 2023-02-13 NOTE — Addendum Note (Signed)
Addended by: Rogers Seeds on: 02/13/2023 05:08 PM   Modules accepted: Orders

## 2023-02-14 DIAGNOSIS — M154 Erosive (osteo)arthritis: Secondary | ICD-10-CM | POA: Diagnosis not present

## 2023-02-15 LAB — C-REACTIVE PROTEIN: CRP: 1 mg/L (ref 0–10)

## 2023-02-15 LAB — URIC ACID: Uric Acid: 4.3 mg/dL (ref 3.8–8.4)

## 2023-02-15 LAB — RHEUMATOID FACTOR: Rheumatoid fact SerPl-aCnc: <10 IU/mL (ref <14.0)

## 2023-02-15 LAB — SEDIMENTATION RATE: Sed Rate: 2 mm/hr (ref 0–30)

## 2023-02-15 LAB — ANA: Anti Nuclear Antibody (ANA): NEGATIVE

## 2023-02-18 NOTE — Progress Notes (Signed)
Remote ICD transmission.   

## 2023-02-20 DIAGNOSIS — R590 Localized enlarged lymph nodes: Secondary | ICD-10-CM | POA: Diagnosis not present

## 2023-02-20 DIAGNOSIS — R61 Generalized hyperhidrosis: Secondary | ICD-10-CM | POA: Diagnosis not present

## 2023-02-20 DIAGNOSIS — M79642 Pain in left hand: Secondary | ICD-10-CM | POA: Diagnosis not present

## 2023-02-20 DIAGNOSIS — R030 Elevated blood-pressure reading, without diagnosis of hypertension: Secondary | ICD-10-CM | POA: Diagnosis not present

## 2023-02-20 DIAGNOSIS — M2540 Effusion, unspecified joint: Secondary | ICD-10-CM | POA: Diagnosis not present

## 2023-02-20 DIAGNOSIS — Z111 Encounter for screening for respiratory tuberculosis: Secondary | ICD-10-CM | POA: Diagnosis not present

## 2023-02-20 DIAGNOSIS — Z6827 Body mass index (BMI) 27.0-27.9, adult: Secondary | ICD-10-CM | POA: Diagnosis not present

## 2023-02-20 DIAGNOSIS — E663 Overweight: Secondary | ICD-10-CM | POA: Diagnosis not present

## 2023-02-20 DIAGNOSIS — M79641 Pain in right hand: Secondary | ICD-10-CM | POA: Diagnosis not present

## 2023-02-20 DIAGNOSIS — Z6826 Body mass index (BMI) 26.0-26.9, adult: Secondary | ICD-10-CM | POA: Diagnosis not present

## 2023-02-20 DIAGNOSIS — Z8546 Personal history of malignant neoplasm of prostate: Secondary | ICD-10-CM | POA: Diagnosis not present

## 2023-02-20 DIAGNOSIS — K4090 Unilateral inguinal hernia, without obstruction or gangrene, not specified as recurrent: Secondary | ICD-10-CM | POA: Diagnosis not present

## 2023-02-20 DIAGNOSIS — M2560 Stiffness of unspecified joint, not elsewhere classified: Secondary | ICD-10-CM | POA: Diagnosis not present

## 2023-02-24 ENCOUNTER — Ambulatory Visit (INDEPENDENT_AMBULATORY_CARE_PROVIDER_SITE_OTHER): Payer: 59 | Admitting: Surgery

## 2023-02-24 ENCOUNTER — Encounter: Payer: Self-pay | Admitting: Surgery

## 2023-02-24 VITALS — BP 139/90 | HR 56 | Temp 98.0°F | Ht 68.0 in | Wt 176.0 lb

## 2023-02-24 DIAGNOSIS — K4091 Unilateral inguinal hernia, without obstruction or gangrene, recurrent: Secondary | ICD-10-CM

## 2023-02-24 DIAGNOSIS — K4090 Unilateral inguinal hernia, without obstruction or gangrene, not specified as recurrent: Secondary | ICD-10-CM

## 2023-02-24 NOTE — Patient Instructions (Signed)
Our surgery scheduler Barbara will call you within 24-48 hours to get you scheduled. If you have not heard from her after 48 hours, please call our office. Have the blue sheet available when she calls to write down important information.   If you have any concerns or questions, please feel free to call our office.   Laparoscopic Inguinal Hernia Repair, Adult Laparoscopic inguinal hernia repair is a surgical procedure to repair a small, weak spot in the groin muscles that allows fat or intestines from inside the abdomen to bulge out (inguinal hernia). This procedure may be planned, or it may be an emergency procedure. During the procedure, tissue that has bulged out is moved back into place, and the opening in the groin muscles is repaired. This is done through three small incisions in the abdomen. A thin tube with a light and camera on the end (laparoscope) is used to help perform the procedure. Tell a health care provider about: Any allergies you have. All medicines you are taking, including vitamins, herbs, eye drops, creams, and over-the-counter medicines. Any problems you or family members have had with anesthetic medicines. Any blood disorders you have. Any surgeries you have had. Any medical conditions you have. Whether you are pregnant or may be pregnant. What are the risks? Generally, this is a safe procedure. However, problems may occur, including: Infection. Bleeding. Allergic reactions to medicines. Damage to nearby structures or organs. Testicle damage or long-term pain and swelling of the scrotum, in males. Inability to completely empty the bladder (urinary retention). Blood clots. A collection of fluid that builds up under the skin (seroma). The hernia coming back (recurrence). What happens before the procedure? Staying hydrated Follow instructions from your health care provider about hydration, which may include: Up to 2 hours before the procedure - you may continue to  drink clear liquids, such as water, clear fruit juice, black coffee, and plain tea.  Eating and drinking restrictions Follow instructions from your health care provider about eating and drinking, which may include: 8 hours before the procedure - stop eating heavy meals or foods, such as meat, fried foods, or fatty foods. 6 hours before the procedure - stop eating light meals or foods, such as toast or cereal. 6 hours before the procedure - stop drinking milk or drinks that contain milk. 2 hours before the procedure - stop drinking clear liquids. Medicines Ask your health care provider about: Changing or stopping your regular medicines. This is especially important if you are taking diabetes medicines or blood thinners. Taking medicines such as aspirin and ibuprofen. These medicines can thin your blood. Do not take these medicines unless your health care provider tells you to take them. Taking over-the-counter medicines, vitamins, herbs, and supplements. General instructions Do not use any products that contain nicotine or tobacco for at least 4 weeks before the procedure, if possible. These products include cigarettes, chewing tobacco, and vaping devices, such as e-cigarettes. If you need help quitting, ask your health care provider. Ask your health care provider: How your surgery site will be marked. What steps will be taken to help prevent infection. These steps may include: Removing hair at the surgery site. Washing skin with a germ-killing soap. Taking antibiotic medicine. Plan to have a responsible adult take you home from the hospital or clinic. Plan to have a responsible adult care for you for the time you are told after you leave the hospital or clinic. This is important. What happens during the procedure? An IV will   be inserted into one of your veins. You will be given one or more of the following: A medicine to help you relax (sedative). A medicine to make you fall asleep  (general anesthetic). Three small incisions will be made in your abdomen. Your abdomen will be inflated with carbon dioxide gas to make the surgical area easier to see. A laparoscope and surgical instruments will be inserted through the incisions. The laparoscope will send images of the inside of your abdomen to a monitor in the room. Tissue that is bulging through the hernia may be removed or moved back into place. The hernia opening will be closed with a sheet of surgical mesh. The surgical instruments and laparoscope will be removed. Your incisions will be closed with stitches (sutures) and adhesive strips. A bandage (dressing) will be placed over your incisions. The procedure may vary among health care providers and hospitals. What happens after the procedure? Your blood pressure, heart rate, breathing rate, and blood oxygen level will be monitored until you leave the hospital or clinic. You will be given pain medicine as needed. You may continue to receive medicines and fluids through an IV. The IV will be removed after you can drink fluids. You will be encouraged to get up and move around and to take deep breaths frequently. If you were given a sedative during the procedure, it can affect you for several hours. Do not drive or operate machinery until your health care provider says that it is safe. Summary Laparoscopic inguinal hernia repair is a surgical procedure to repair a small, weak spot in the groin muscles that allows fat or intestines from inside the abdomen to bulge out (inguinal hernia). This procedure is done through three small incisions in the abdomen. A thin tube with a light and camera on the end (laparoscope) is used to help perform the procedure. After the procedure, you will be encouraged to get up and move around and to take deep breaths frequently. This information is not intended to replace advice given to you by your health care provider. Make sure you discuss any  questions you have with your health care provider. Document Revised: 05/30/2020 Document Reviewed: 05/30/2020 Elsevier Patient Education  2023 Elsevier Inc.  

## 2023-02-25 ENCOUNTER — Telehealth: Payer: Self-pay | Admitting: Surgery

## 2023-02-25 NOTE — Progress Notes (Signed)
Outpatient Surgical Follow Up  02/25/2023  Massie Juniel is an 57 y.o. male.   Chief Complaint  Patient presents with   Follow-up    HPI: Is a 57 year old male comes in with a recurrent bulge on the left groin.  He did have a robotic inguinal hernia repair 3-1/2 months ago and umbilical hernia repair by me.  Over the last 3 weeks or so he felt a bulge back with some intermittent pain.  Pain is mild and worsening with Valsalva.  No fevers no chills.  No other changes.  He states that he is working more in his yard and then shortly after noticed a bulge. No recent cardiac issues.  He is still walking and performing more than 4 METS of activity without shortness of breath or chest pain.  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 Jude Quadra Assura CRT-D   Arthritis    CAD (coronary artery disease) 09/10/2021   a.) R/LHC 09/10/2021: 30% pLCx - med mgmt.   Cardiomegaly    CHF (congestive heart failure) (HCC)    a.) TTE 09/07/2021: EF 25-30%, diff inf HK, mod LV dil, mod LAE, mod-sev MR; b.) R/LHC 09/10/2021: EF 25%, mPA 13, PCWP 5, CO 6.2, CI 3.1; c.) cMRI 09/11/2021: EF 14%, sev LV dil, diff HK; d.) TTE 01/11/2022: EF 25-30%, glob HK, mod LV dil, mod LAE, mild MR, G2DD; e.) CRT-D implanted 04/08/2022; f.) TTE 08/08/2022: EF 30-35%, glob HK, triv MR, G1DD   DDD (degenerative disc disease), lumbar    Emphysema of lung (HCC)    Erectile dysfunction    a.) on PDE5i (sildenafil) PRN   GERD (gastroesophageal reflux disease)    Hepatitis C virus infection cured after antiviral drug therapy    History of substance abuse (HCC)    a.) THC + crack cocaine   HLD (hyperlipidemia)    Hypertension    Hypospadias, balanic    LBBB (left bundle branch block) 04/26/2021   a.) noted on preop ECG 04/26/2021   Left inguinal hernia    Long term current use of amiodarone    Lower urinary tract symptoms (LUTS)    NICM (nonischemic  cardiomyopathy) (HCC)    a.) TTE 09/07/2021: EF 25-30%; b.) R/LHC 09/10/2021: mPA 13, PCWP 5, CO 6.2, CI 3.1; c.) cMRI 09/11/2021: EF 14%, diff HK, RV insertion site LGE; d.) TTE 01/11/2022: EF 25-30% e.) CRT-D implanted 04/08/2022; f.) TTE 08/08/2022: EF 30-35%   Pre-diabetes    Prostate cancer (HCC) 01/08/2021   a.) Gleason 3+4, T2a; b.) s/p brachytherapy 06/05/2021   Umbilical hernia     Past Surgical History:  Procedure Laterality Date   BIOPSY PROSTATE     BIV ICD INSERTION CRT-D N/A 04/08/2022   Procedure: BIV ICD INSERTION CRT-D;  Surgeon: Duke Salvia, MD;  Location: [facility] INVASIVE CV LAB;  Service: Cardiovascular;  Laterality: N/A;   CIRCUMCISION N/A 06/05/2021   Procedure: CIRCUMCISION ADULT;  Surgeon: Bjorn Pippin, MD;  Location: [facility];  Service: Urology;  Laterality: N/A;   HEMORRHOID SURGERY     HYPOSPADIAS CORRECTION     INSERTION OF MESH  11/19/2022   Procedure: INSERTION OF MESH;  Surgeon: Leafy Ro, MD;  Location: [facility];  Service: General;;   RADIOACTIVE SEED IMPLANT N/A 06/05/2021   Procedure: RADIOACTIVE SEED IMPLANT/BRACHYTHERAPY IMPLANT;  Surgeon: Bjorn Pippin, MD;  Location: [facility];  Service: Urology;  Laterality: N/A;   RIGHT/LEFT HEART  CATH AND CORONARY ANGIOGRAPHY N/A 09/10/2021   Procedure: RIGHT/LEFT HEART CATH AND CORONARY ANGIOGRAPHY;  Surgeon: Dolores Patty, MD;  Location: [facility] INVASIVE CV LAB;  Service: Cardiovascular;  Laterality: N/A;   SPACE OAR INSTILLATION N/A 06/05/2021   Procedure: SPACE OAR INSTILLATION;  Surgeon: Bjorn Pippin, MD;  Location: [facility];  Service: Urology;  Laterality: N/A;   UMBILICAL HERNIA REPAIR N/A 11/19/2022   Procedure: HERNIA REPAIR UMBILICAL ADULT;  Surgeon: Leafy Ro, MD;  Location: [facility];  Service: General;  Laterality: N/A;    Family History  Problem Relation Age of Onset   Stroke Father    Lung cancer Maternal Aunt    Diabetes Maternal Uncle    Brain  cancer Maternal Uncle    Lung cancer Maternal Uncle    Lung cancer Maternal Uncle    Cancer Paternal Grandmother    Diabetes Paternal Grandfather    Colon cancer Neg Hx    Esophageal cancer Neg Hx    Stomach cancer Neg Hx    Pancreatic cancer Neg Hx    Colon polyps Neg Hx    Rectal cancer Neg Hx     Social History:  reports that he quit smoking about 15 years ago. His smoking use included cigarettes. He has a 52.50 pack-year smoking history. He has been exposed to tobacco smoke. He has never used smokeless tobacco. He reports that he does not currently use alcohol. He reports that he does not currently use drugs after having used the following drugs: Marijuana and "Crack" cocaine.  Allergies: No Known Allergies  Medications reviewed.    ROS Full ROS performed and is otherwise negative other than what is stated in HPI   BP (!) 139/90   Pulse (!) 56   Temp 98 F (36.7 C)   Ht 5\' 8"  (1.727 m)   Wt 176 lb (79.8 kg)   SpO2 98%   BMI 26.76 kg/m   Physical Exam CONSTITUTIONAL: NAD. EYES: Pupils are equal, round, and reactive to light, Sclera are non-icteric. EARS, NOSE, MOUTH AND THROAT:oral mucosa is pink and moist. Hearing is intact to voice. LYMPH NODES:  Lymph nodes in the neck are normal. RESPIRATORY:  Lungs are clear. There is normal respiratory effort, with equal breath sounds bilaterally, and without pathologic use of accessory muscles. CARDIOVASCULAR: Heart is regular without murmurs, gallops, or rubs. GI: The abdomen is  soft, nontender, and nondistended. There are no palpable masses. There is no hepatosplenomegaly. There are normal bowel sounds in all quadrants. There is  evidence of a reducible left inguinal hernia that is tender.  There is no peritonitis.   Additionally he does have a umbilical hernia. GU: Rectal deferred.   MUSCULOSKELETAL: Normal muscle strength and tone. No cyanosis or edema.   SKIN: Turgor is good and there are no pathologic skin lesions or  ulcers. NEUROLOGIC: Motor and sensation is grossly normal. Cranial nerves are grossly intact. PSYCH:  Oriented to person, place and time. Affect is normal.   Assessment/Plan:  Recurrent  Left inguinal hernia, unsure why he had recurrence.iscussed with the patient in detail about his disease process. Gust with him about options of doing a CT scan versus proceeding with repair.  He wishes to go ahead and have the repair done.  Symptoms of robotic versus open discussed with him as well.  Do think with a robotic approach may be able to assess the cause of the recurrence  I do definitely recommend repair of inguinal hernia and I  do think that we can do this robotically.  Postoperative course and outcomes were also explained to him. Procedure discuss in detail. Risk, benefits and possible complications including but not limited to: Bleeding, infection, recurrence, bowel injuries, chronic pain. He understands and wishes to proceed.   I Spent 30 minutes in this encounter including reviewing records, counseling the patient, placing orders and performing appropriate documentation   Sterling Big, MD [facility] General Surgeon

## 2023-02-25 NOTE — Telephone Encounter (Signed)
Unable to leave a message, no voice mail.  If patient calls back, please inform him of scheduled surgery with Dr Everlene Farrier.    Pre-Admission date/time, and Surgery date at [facility].  Surgery Date: 03/13/23 Preadmission Testing Date: 02/27/23 (phone 8a-1p)  Also patient will need to call at [phone], between 1-3:00pm the day before surgery, to find out what time to arrive for surgery.

## 2023-02-25 NOTE — H&P (View-Only) (Signed)
Outpatient Surgical Follow Up  02/25/2023  Ruben Duffy is an 56 y.o. male.   Chief Complaint  Patient presents with   Follow-up    HPI: Is a 56-year-old male comes in with a recurrent bulge on the left groin.  He did have a robotic inguinal hernia repair 3-1/2 months ago and umbilical hernia repair by me.  Over the last 3 weeks or so he felt a bulge back with some intermittent pain.  Pain is mild and worsening with Valsalva.  No fevers no chills.  No other changes.  He states that he is working more in his yard and then shortly after noticed a bulge. No recent cardiac issues.  He is still walking and performing more than 4 METS of activity without shortness of breath or chest pain.  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 Jude Quadra Assura CRT-D   Arthritis    CAD (coronary artery disease) 09/10/2021   a.) R/LHC 09/10/2021: 30% pLCx - med mgmt.   Cardiomegaly    CHF (congestive heart failure) (HCC)    a.) TTE 09/07/2021: EF 25-30%, diff inf HK, mod LV dil, mod LAE, mod-sev MR; b.) R/LHC 09/10/2021: EF 25%, mPA 13, PCWP 5, CO 6.2, CI 3.1; c.) cMRI 09/11/2021: EF 14%, sev LV dil, diff HK; d.) TTE 01/11/2022: EF 25-30%, glob HK, mod LV dil, mod LAE, mild MR, G2DD; e.) CRT-D implanted 04/08/2022; f.) TTE 08/08/2022: EF 30-35%, glob HK, triv MR, G1DD   DDD (degenerative disc disease), lumbar    Emphysema of lung (HCC)    Erectile dysfunction    a.) on PDE5i (sildenafil) PRN   GERD (gastroesophageal reflux disease)    Hepatitis C virus infection cured after antiviral drug therapy    History of substance abuse (HCC)    a.) THC + crack cocaine   HLD (hyperlipidemia)    Hypertension    Hypospadias, balanic    LBBB (left bundle branch block) 04/26/2021   a.) noted on preop ECG 04/26/2021   Left inguinal hernia    Long term current use of amiodarone    Lower urinary tract symptoms (LUTS)    NICM (nonischemic  cardiomyopathy) (HCC)    a.) TTE 09/07/2021: EF 25-30%; b.) R/LHC 09/10/2021: mPA 13, PCWP 5, CO 6.2, CI 3.1; c.) cMRI 09/11/2021: EF 14%, diff HK, RV insertion site LGE; d.) TTE 01/11/2022: EF 25-30% e.) CRT-D implanted 04/08/2022; f.) TTE 08/08/2022: EF 30-35%   Pre-diabetes    Prostate cancer (HCC) 01/08/2021   a.) Gleason 3+4, T2a; b.) s/p brachytherapy 06/05/2021   Umbilical hernia     Past Surgical History:  Procedure Laterality Date   BIOPSY PROSTATE     BIV ICD INSERTION CRT-D N/A 04/08/2022   Procedure: BIV ICD INSERTION CRT-D;  Surgeon: Klein, Steven C, MD;  Location: [facility] INVASIVE CV LAB;  Service: Cardiovascular;  Laterality: N/A;   CIRCUMCISION N/A 06/05/2021   Procedure: CIRCUMCISION ADULT;  Surgeon: Wrenn, John, MD;  Location: [facility];  Service: Urology;  Laterality: N/A;   HEMORRHOID SURGERY     HYPOSPADIAS CORRECTION     INSERTION OF MESH  11/19/2022   Procedure: INSERTION OF MESH;  Surgeon: Lamarr Feenstra F, MD;  Location: [facility];  Service: General;;   RADIOACTIVE SEED IMPLANT N/A 06/05/2021   Procedure: RADIOACTIVE SEED IMPLANT/BRACHYTHERAPY IMPLANT;  Surgeon: Wrenn, John, MD;  Location: [facility];  Service: Urology;  Laterality: N/A;   RIGHT/LEFT HEART   CATH AND CORONARY ANGIOGRAPHY N/A 09/10/2021   Procedure: RIGHT/LEFT HEART CATH AND CORONARY ANGIOGRAPHY;  Surgeon: Bensimhon, Daniel R, MD;  Location: [facility] INVASIVE CV LAB;  Service: Cardiovascular;  Laterality: N/A;   SPACE OAR INSTILLATION N/A 06/05/2021   Procedure: SPACE OAR INSTILLATION;  Surgeon: Wrenn, John, MD;  Location: [facility];  Service: Urology;  Laterality: N/A;   UMBILICAL HERNIA REPAIR N/A 11/19/2022   Procedure: HERNIA REPAIR UMBILICAL ADULT;  Surgeon: Blaire Hodsdon F, MD;  Location: [facility];  Service: General;  Laterality: N/A;    Family History  Problem Relation Age of Onset   Stroke Father    Lung cancer Maternal Aunt    Diabetes Maternal Uncle    Brain  cancer Maternal Uncle    Lung cancer Maternal Uncle    Lung cancer Maternal Uncle    Cancer Paternal Grandmother    Diabetes Paternal Grandfather    Colon cancer Neg Hx    Esophageal cancer Neg Hx    Stomach cancer Neg Hx    Pancreatic cancer Neg Hx    Colon polyps Neg Hx    Rectal cancer Neg Hx     Social History:  reports that he quit smoking about 15 years ago. His smoking use included cigarettes. He has a 52.50 pack-year smoking history. He has been exposed to tobacco smoke. He has never used smokeless tobacco. He reports that he does not currently use alcohol. He reports that he does not currently use drugs after having used the following drugs: Marijuana and "Crack" cocaine.  Allergies: No Known Allergies  Medications reviewed.    ROS Full ROS performed and is otherwise negative other than what is stated in HPI   BP (!) 139/90   Pulse (!) 56   Temp 98 F (36.7 C)   Ht 5' 8" (1.727 m)   Wt 176 lb (79.8 kg)   SpO2 98%   BMI 26.76 kg/m   Physical Exam CONSTITUTIONAL: NAD. EYES: Pupils are equal, round, and reactive to light, Sclera are non-icteric. EARS, NOSE, MOUTH AND THROAT:oral mucosa is pink and moist. Hearing is intact to voice. LYMPH NODES:  Lymph nodes in the neck are normal. RESPIRATORY:  Lungs are clear. There is normal respiratory effort, with equal breath sounds bilaterally, and without pathologic use of accessory muscles. CARDIOVASCULAR: Heart is regular without murmurs, gallops, or rubs. GI: The abdomen is  soft, nontender, and nondistended. There are no palpable masses. There is no hepatosplenomegaly. There are normal bowel sounds in all quadrants. There is  evidence of a reducible left inguinal hernia that is tender.  There is no peritonitis.   Additionally he does have a umbilical hernia. GU: Rectal deferred.   MUSCULOSKELETAL: Normal muscle strength and tone. No cyanosis or edema.   SKIN: Turgor is good and there are no pathologic skin lesions or  ulcers. NEUROLOGIC: Motor and sensation is grossly normal. Cranial nerves are grossly intact. PSYCH:  Oriented to person, place and time. Affect is normal.   Assessment/Plan:  Recurrent  Left inguinal hernia, unsure why he had recurrence.iscussed with the patient in detail about his disease process. Gust with him about options of doing a CT scan versus proceeding with repair.  He wishes to go ahead and have the repair done.  Symptoms of robotic versus open discussed with him as well.  Do think with a robotic approach may be able to assess the cause of the recurrence  I do definitely recommend repair of inguinal hernia and I   do think that we can do this robotically.  Postoperative course and outcomes were also explained to him. Procedure discuss in detail. Risk, benefits and possible complications including but not limited to: Bleeding, infection, recurrence, bowel injuries, chronic pain. He understands and wishes to proceed.   I Spent 30 minutes in this encounter including reviewing records, counseling the patient, placing orders and performing appropriate documentation   Runa Whittingham, MD FACS General Surgeon 

## 2023-02-26 ENCOUNTER — Telehealth: Payer: Self-pay | Admitting: *Deleted

## 2023-02-26 NOTE — Telephone Encounter (Signed)
Forwarding to Advanced Heart Failure team to address clearance  Ruben Aland, NP-C 02/26/2023, 8:37 AM [address], [address] Office [phone] Fax [phone]

## 2023-02-26 NOTE — Telephone Encounter (Signed)
1. What type of surgery is being performed?  XI ROBOTIC ASSISTED INGUINAL HERNIA, recurrent   2. When is this surgery scheduled?  03/03/2023    3. Type of clearance being requested (medical, pharmacy, both)?  MEDICAL    4. Are there any medications that need to be held prior to surgery?  NONE   5. Practice name and name of physician performing surgery?  Performing surgeon: Dr. Sterling Big, MD  Requesting clearance: Quentin Mulling, FNP-C     6. Anesthesia type (none, local, MAC, general)?  GENERAL   7. What is the office phone and fax number?   Phone: [phone]  Fax: [phone]

## 2023-02-26 NOTE — Telephone Encounter (Signed)
Called patient again, he is now informed of all dates regarding his surgery.    

## 2023-02-26 NOTE — Telephone Encounter (Signed)
-----   Message from Verlee Monte, NP sent at 02/25/2023  8:09 PM EDT ----- Regarding: Request for pre-operative cardiac clearance Request for pre-operative cardiac clearance:  1. What type of surgery is being performed?  XI ROBOTIC ASSISTED INGUINAL HERNIA, recurrent  2. When is this surgery scheduled?  03/03/2023  3. Type of clearance being requested (medical, pharmacy, both)? MEDICAL   4. Are there any medications that need to be held prior to surgery? NONE  5. Practice name and name of physician performing surgery?  Performing surgeon: Dr. Sterling Big, MD Requesting clearance: Ruben Mulling, FNP-C    6. Anesthesia type (none, local, MAC, general)? GENERAL  7. What is the office phone and fax number?   Phone: [phone] Fax: [phone]  ATTENTION: Unable to create telephone message as per your standard workflow. Directed by [facility] providers to send requests for cardiac clearance to this pool for appropriate distribution to provider covering pre-operative clearances.   Ruben Mulling, MSN, APRN, FNP-C, CEN [facility]  Peri-operative Services Nurse Practitioner Phone: [phone] 02/25/23 8:09 PM

## 2023-02-27 ENCOUNTER — Other Ambulatory Visit: Payer: Self-pay

## 2023-02-27 ENCOUNTER — Encounter: Payer: Self-pay | Admitting: Internal Medicine

## 2023-02-27 ENCOUNTER — Encounter
Admission: RE | Admit: 2023-02-27 | Discharge: 2023-02-27 | Disposition: A | Discharge status: Home / Self Care | Payer: 59 | Source: Ambulatory Visit | Attending: Surgery | Admitting: Surgery

## 2023-02-27 DIAGNOSIS — I5022 Chronic systolic (congestive) heart failure: Secondary | ICD-10-CM

## 2023-02-27 DIAGNOSIS — I447 Left bundle-branch block, unspecified: Secondary | ICD-10-CM

## 2023-02-27 DIAGNOSIS — I10 Essential (primary) hypertension: Secondary | ICD-10-CM

## 2023-02-27 NOTE — Pre-Procedure Instructions (Signed)
Patient reports that he has stopped taking all of his medications because he does not like the way that they make him feel, " they make me have Dementia, and I can't do my job". Patient can't recall when he stop taking the medication , looking in his chart it looks that he reported not taking his medications on 02/20/23 at a doctors appointment. Quentin Mulling NP made aware.

## 2023-02-27 NOTE — Progress Notes (Signed)
  Perioperative Services Pre-Admission/Anesthesia Testing    Date: 02/27/23  Name: Ruben Duffy MRN:   [id number]  Re: Clinical information regarding medications  PAT interview for upcoming surgery completed today. Patient advised staff that he has self discontinued all of his medications, including those being used to treat his heart failure. He has not communicated this information to his advanced heart failure team. Patient cited that he made this decision because they were causing him to experience altered mental status, which in turn is preventing him from "doing what he wants and needs to do in life".   Note being included on clearance request for upcoming surgery for review by his cardiology team.   Quentin Mulling, MSN, APRN, FNP-C, CEN [facility]  Peri-operative Services Nurse Practitioner Phone: [phone] 02/27/23 12:35 PM  NOTE: This note has been prepared using Dragon dictation software. Despite my best ability to proofread, there is always the potential that unintentional transcriptional errors may still occur from this process.

## 2023-02-27 NOTE — Progress Notes (Signed)
PERIOPERATIVE PRESCRIPTION FOR IMPLANTED CARDIAC DEVICE PROGRAMMING  Patient Information: Name:  Ruben Duffy  DOB:  1966-10-06  MRN:  [id number]    Planned Procedure:  XI ROBOTIC ASSISTED INGUINAL HERNIA, recurrent (Left)   Surgeon:  Dr. Sterling Big, MD  Requesting device clearance: Quentin Mulling, FNP-C  Date of Procedure:  03/13/2023  Cautery will be used.   Please route documentation back me via [facility], or may fax report to [facility] PAT APP at [phone].   Device Information:  Clinic EP Physician:  Sherryl Manges, MD   Device Type:  Defibrillator Manufacturer and Phone #:  [employer]: [phone] Pacemaker Dependent?:  Unknown Date of Last Device Check:  01/07/23 Normal Device Function?:  Yes.    Electrophysiologist's Recommendations:  Have magnet available. Provide continuous ECG monitoring when magnet is used or reprogramming is to be performed.  Procedure may interfere with device function.  Magnet should be placed over device during procedure.  Per Device Clinic Standing Orders, Lenor Coffin, RN  12:55 PM 02/27/2023

## 2023-02-27 NOTE — Patient Instructions (Addendum)
Your procedure is scheduled on: 03/13/23 - Thursday Report to the Registration Desk on the 1st floor of the [facility]. To find out your arrival time, please call [phone] between 1PM - 3PM on: 03/12/23 - Wednesday If your arrival time is 6:00 am, do not arrive before that time as the Medical [facility] entrance doors do not open until 6:00 am.  REMEMBER: Instructions that are not followed completely may result in serious medical risk, up to and including death; or upon the discretion of your surgeon and anesthesiologist your surgery may need to be rescheduled.  Do not eat food after midnight the night before surgery.  No gum chewing or hard candies.  You may however, drink CLEAR liquids up to 2 hours before you are scheduled to arrive for your surgery. Do not drink anything within 2 hours of your scheduled arrival time.  Clear liquids include: - water  - apple juice without pulp - gatorade (not RED colors) - black coffee or tea (Do NOT add milk or creamers to the coffee or tea) Do NOT drink anything that is not on this list.  One week prior to surgery: Stop Anti-inflammatories (NSAIDS) such as Advil, Aleve, Ibuprofen, Motrin, Naproxen, Naprosyn and Aspirin based products such as Excedrin, Goody's Powder, BC Powder.  Stop ANY OVER THE COUNTER supplements until after surgery.  You may however, continue to take Tylenol if needed for pain up until the day of surgery.  TAKE ONLY THESE MEDICATIONS THE MORNING OF SURGERY WITH A SIP OF WATER:  NONE   No Alcohol for 24 hours before or after surgery.  No Smoking including e-cigarettes for 24 hours before surgery.  No chewable tobacco products for at least 6 hours before surgery.  No nicotine patches on the day of surgery.  Do not use any "recreational" drugs for at least a week (preferably 2 weeks) before your surgery.  Please be advised that the combination of cocaine and anesthesia may have negative outcomes, up to and including  death. If you test positive for cocaine, your surgery will be cancelled.  On the morning of surgery brush your teeth with toothpaste and water, you may rinse your mouth with mouthwash if you wish. Do not swallow any toothpaste or mouthwash.  Do not wear jewelry, make-up, hairpins, clips or nail polish.  Do not wear lotions, powders, or perfumes.   Do not shave body hair from the neck down 48 hours before surgery.  Contact lenses, hearing aids and dentures may not be worn into surgery.  Do not bring valuables to the hospital. [facility] is not responsible for any missing/lost belongings or valuables.   Notify your doctor if there is any change in your medical condition (cold, fever, infection).  Wear comfortable clothing (specific to your surgery type) to the hospital.  After surgery, you can help prevent lung complications by doing breathing exercises.  Take deep breaths and cough every 1-2 hours. Your doctor may order a device called an Incentive Spirometer to help you take deep breaths. When coughing or sneezing, hold a pillow firmly against your incision with both hands. This is called "splinting." Doing this helps protect your incision. It also decreases belly discomfort.  If you are being admitted to the hospital overnight, leave your suitcase in the car. After surgery it may be brought to your room.  In case of increased patient census, it may be necessary for you, the patient, to continue your postoperative care in the Same Day Surgery department.  If you are being discharged the day of surgery, you will not be allowed to drive home. You will need a responsible individual to drive you home and stay with you for 24 hours after surgery.   If you are taking public transportation, you will need to have a responsible individual with you.  Please call the Pre-admissions Testing Dept. at [phone] if you have any questions about these instructions.  Surgery Visitation  Policy:  Patients having surgery or a procedure may have two visitors.  Children under the age of 58 must have an adult with them who is not the patient.  Inpatient Visitation:    Visiting hours are 7 a.m. to 8 p.m. Up to four visitors are allowed at one time in a patient room. The visitors may rotate out with other people during the day.  One visitor age 7 or older may stay with the patient overnight and must be in the room by 8 p.m.

## 2023-02-28 NOTE — Telephone Encounter (Signed)
Per message pt stopped all meds, attempted to call pt to discuss further and Left message to call back

## 2023-03-03 ENCOUNTER — Encounter (HOSPITAL_COMMUNITY): Payer: Self-pay

## 2023-03-03 ENCOUNTER — Encounter
Admission: RE | Admit: 2023-03-03 | Discharge: 2023-03-03 | Disposition: A | Discharge status: Home / Self Care | Payer: 59 | Source: Ambulatory Visit | Attending: Surgery | Admitting: Surgery

## 2023-03-03 ENCOUNTER — Telehealth (HOSPITAL_COMMUNITY): Payer: Self-pay | Admitting: Physician Assistant

## 2023-03-03 DIAGNOSIS — I5022 Chronic systolic (congestive) heart failure: Secondary | ICD-10-CM | POA: Diagnosis not present

## 2023-03-03 DIAGNOSIS — R9431 Abnormal electrocardiogram [ECG] [EKG]: Secondary | ICD-10-CM | POA: Diagnosis not present

## 2023-03-03 DIAGNOSIS — I447 Left bundle-branch block, unspecified: Secondary | ICD-10-CM

## 2023-03-03 DIAGNOSIS — Z01810 Encounter for preprocedural cardiovascular examination: Secondary | ICD-10-CM | POA: Insufficient documentation

## 2023-03-03 DIAGNOSIS — I10 Essential (primary) hypertension: Secondary | ICD-10-CM | POA: Insufficient documentation

## 2023-03-03 DIAGNOSIS — Z01818 Encounter for other preprocedural examination: Secondary | ICD-10-CM | POA: Diagnosis not present

## 2023-03-03 NOTE — Progress Notes (Signed)
  Perioperative Services Pre-Admission/Anesthesia Testing    Date: 03/03/23  Name: Ruben Duffy MRN:   [id number]  Re: Cardiac medications  Patient presented to the PAT clinic on the morning of 03/03/2023 for preoperative ECG.  In review of his preoperative tracing, patient is showing an atrial sensed ventricular paced rhythm at a rate of 70 bpm.  There were no significant ST or T wave abnormalities.  During his time in PAT clinic, patient advising staff again that he had stopped all of his cardiac medications citing that they were "messing with his mind".  Patient stated, "I wanted to see with this EKG looks like before I told my heart doctor that I had stopped all of my medications".  Will pass this information along to patient's primary cardiology/electrophysiology team to make them aware of the aforementioned.  I have spoken with patient's primary attending surgeon Everlene Farrier, MD) to relay information regarding the fact that patient has stopped all of his medications, thus despite patient just having surgery with [place] in 11/2022, updated cardiac clearance has been requested.  Quentin Mulling, MSN, APRN, FNP-C, CEN [facility]  Peri-operative Services Nurse Practitioner Phone: [phone] 03/03/23 8:23 AM  NOTE: This note has been prepared using Dragon dictation software. Despite my best ability to proofread, there is always the potential that unintentional transcriptional errors may still occur from this process.

## 2023-03-03 NOTE — Telephone Encounter (Signed)
Received notification patiet stopped all of his cardiac medications. He wants to have inguinal hernia repair and needs updated clearance d/t being off all meds.  Please call him to find out what/if anything he is taking. Will likely need to resume some meds and arrange clinic f/u.

## 2023-03-03 NOTE — Telephone Encounter (Signed)
Pt states he stopped all meds after last visit and has been doing fine since then, no complaints. Appt sch for Fri 5/24 with Dr Gala Romney

## 2023-03-03 NOTE — Telephone Encounter (Signed)
We would not need to restart all of the heart medications at once. It could just be that he was on more than his body could tolerate. We have several different options that can be started slowly. Without some treatment for his heart failure I worry there is a good chance that his heart failure will continue to worsen. Would be best to discuss this in a visit.

## 2023-03-03 NOTE — Telephone Encounter (Signed)
LMOM and mychart message sent.

## 2023-03-03 NOTE — Telephone Encounter (Signed)
Spoke w/pt, he states he stopped all meds back in feb/march, b/c he felt "out of it" all the time and could not concentrate, he reports since then he has been feeling fine, no complaints.   Advised we can not clear him for surgery without appt, he is agreeable, appt sch for Fri 5/24 at 11 am with Dr Gala Romney

## 2023-03-06 DIAGNOSIS — I509 Heart failure, unspecified: Secondary | ICD-10-CM | POA: Diagnosis not present

## 2023-03-06 DIAGNOSIS — M1990 Unspecified osteoarthritis, unspecified site: Secondary | ICD-10-CM | POA: Diagnosis not present

## 2023-03-06 DIAGNOSIS — E663 Overweight: Secondary | ICD-10-CM | POA: Diagnosis not present

## 2023-03-06 DIAGNOSIS — M2540 Effusion, unspecified joint: Secondary | ICD-10-CM | POA: Diagnosis not present

## 2023-03-06 DIAGNOSIS — R32 Unspecified urinary incontinence: Secondary | ICD-10-CM | POA: Diagnosis not present

## 2023-03-06 DIAGNOSIS — Z008 Encounter for other general examination: Secondary | ICD-10-CM | POA: Diagnosis not present

## 2023-03-06 DIAGNOSIS — Z833 Family history of diabetes mellitus: Secondary | ICD-10-CM | POA: Diagnosis not present

## 2023-03-06 DIAGNOSIS — J439 Emphysema, unspecified: Secondary | ICD-10-CM | POA: Diagnosis not present

## 2023-03-06 DIAGNOSIS — M79641 Pain in right hand: Secondary | ICD-10-CM | POA: Diagnosis not present

## 2023-03-06 DIAGNOSIS — I429 Cardiomyopathy, unspecified: Secondary | ICD-10-CM | POA: Diagnosis not present

## 2023-03-06 DIAGNOSIS — M2560 Stiffness of unspecified joint, not elsewhere classified: Secondary | ICD-10-CM | POA: Diagnosis not present

## 2023-03-06 DIAGNOSIS — M79642 Pain in left hand: Secondary | ICD-10-CM | POA: Diagnosis not present

## 2023-03-06 DIAGNOSIS — Z823 Family history of stroke: Secondary | ICD-10-CM | POA: Diagnosis not present

## 2023-03-06 DIAGNOSIS — I110 Hypertensive heart disease with heart failure: Secondary | ICD-10-CM | POA: Diagnosis not present

## 2023-03-06 DIAGNOSIS — Z791 Long term (current) use of non-steroidal anti-inflammatories (NSAID): Secondary | ICD-10-CM | POA: Diagnosis not present

## 2023-03-06 DIAGNOSIS — Z6826 Body mass index (BMI) 26.0-26.9, adult: Secondary | ICD-10-CM | POA: Diagnosis not present

## 2023-03-06 DIAGNOSIS — Z8249 Family history of ischemic heart disease and other diseases of the circulatory system: Secondary | ICD-10-CM | POA: Diagnosis not present

## 2023-03-06 DIAGNOSIS — E785 Hyperlipidemia, unspecified: Secondary | ICD-10-CM | POA: Diagnosis not present

## 2023-03-06 DIAGNOSIS — K219 Gastro-esophageal reflux disease without esophagitis: Secondary | ICD-10-CM | POA: Diagnosis not present

## 2023-03-06 DIAGNOSIS — M0600 Rheumatoid arthritis without rheumatoid factor, unspecified site: Secondary | ICD-10-CM | POA: Diagnosis not present

## 2023-03-06 NOTE — Telephone Encounter (Signed)
   Name: Ruben Duffy  DOB: [birth year]  MRN: [id number]  Primary Cardiologist: Nanetta Batty, MD  Chart reviewed as part of pre-operative protocol coverage. The patient has an upcoming visit scheduled with Dr. Gala Romney on 03/07/2023 at which time clearance can be addressed in case there are any issues that would impact surgical recommendations.   I will route this message as FYI to requesting party and remove this message from the preop box as separate preop APP input not needed at this time.   Please call with any questions.  Napoleon Form, Leodis Rains, NP  03/06/2023, 7:24 AM

## 2023-03-07 ENCOUNTER — Encounter: Payer: Self-pay | Admitting: Surgery

## 2023-03-07 ENCOUNTER — Encounter (HOSPITAL_COMMUNITY): Payer: Self-pay | Admitting: Internal Medicine

## 2023-03-07 ENCOUNTER — Other Ambulatory Visit (HOSPITAL_COMMUNITY): Payer: Self-pay

## 2023-03-07 ENCOUNTER — Ambulatory Visit (HOSPITAL_COMMUNITY)
Admission: RE | Admit: 2023-03-07 | Discharge: 2023-03-07 | Disposition: A | Discharge status: Home / Self Care | Payer: 59 | Source: Ambulatory Visit | Attending: Internal Medicine | Admitting: Internal Medicine

## 2023-03-07 VITALS — BP 128/90 | HR 77 | Wt 175.0 lb

## 2023-03-07 DIAGNOSIS — Z01810 Encounter for preprocedural cardiovascular examination: Secondary | ICD-10-CM | POA: Diagnosis not present

## 2023-03-07 DIAGNOSIS — I5022 Chronic systolic (congestive) heart failure: Secondary | ICD-10-CM | POA: Diagnosis not present

## 2023-03-07 DIAGNOSIS — R9431 Abnormal electrocardiogram [ECG] [EKG]: Secondary | ICD-10-CM | POA: Insufficient documentation

## 2023-03-07 LAB — COMPREHENSIVE METABOLIC PANEL
ALT: 15 U/L (ref 0–44)
AST: 18 U/L (ref 15–41)
Albumin: 3.7 g/dL (ref 3.5–5.0)
Alkaline Phosphatase: 52 U/L (ref 38–126)
Anion gap: 6 (ref 5–15)
BUN: 8 mg/dL (ref 6–20)
CO2: 26 mmol/L (ref 22–32)
Calcium: 9.1 mg/dL (ref 8.9–10.3)
Chloride: 106 mmol/L (ref 98–111)
Creatinine, Ser: 0.93 mg/dL (ref 0.61–1.24)
GFR, Estimated: >60 mL/min (ref >60)
Glucose, Bld: 92 mg/dL (ref 70–99)
Potassium: 4 mmol/L (ref 3.5–5.1)
Sodium: 138 mmol/L (ref 135–145)
Total Bilirubin: 1 mg/dL (ref 0.3–1.2)
Total Protein: 6.5 g/dL (ref 6.5–8.1)

## 2023-03-07 LAB — BRAIN NATRIURETIC PEPTIDE: B Natriuretic Peptide: 87.5 pg/mL (ref 0.0–100.0)

## 2023-03-07 MED ORDER — CARVEDILOL 3.125 MG PO TABS: 3.1250 mg | ORAL_TABLET | Freq: Two times a day (BID) | ORAL | 3 refills | Status: DC

## 2023-03-07 MED ORDER — ENTRESTO 24-26 MG PO TABS: 1.0000 | ORAL_TABLET | Freq: Two times a day (BID) | ORAL | 0 refills | Status: DC

## 2023-03-07 NOTE — Progress Notes (Signed)
Perioperative / Anesthesia Services  Pre-Admission Testing Clinical Review / Preoperative Anesthesia Consult  Date: 03/07/23  Patient Demographics:  Name: Ruben Duffy DOB:   October 31, 1965 MRN:   [id number]  Planned Surgical Procedure(s):    Case: [id number] Date/Time: 03/13/23 0715   Procedure: XI ROBOTIC ASSISTED INGUINAL HERNIA, recurrent (Left)   Anesthesia type: General   Pre-op diagnosis: recurrent inguinal hernia   Location: [facility] OR ROOM 06 / [facility]   Surgeons: Leafy Ro, MD     NOTE: Available PAT nursing documentation and vital signs have been reviewed. Clinical nursing staff has updated patient's PMH/PSHx, current medication list, and drug allergies/intolerances to ensure comprehensive history available to assist in medical decision making as it pertains to the aforementioned surgical procedure and anticipated anesthetic course. Extensive review of available clinical information personally performed. [place] [facility] and PSHx updated with any diagnoses/procedures that  may have been inadvertently omitted during his intake with the pre-admission testing department's nursing staff.  Clinical Discussion:  Ruben Duffy is a 57 y.o. male who is submitted for pre-surgical anesthesia review and clearance prior to him undergoing the above procedure. Patient is a Former Smoker (45 pack years; quit 10/2007). Pertinent PMH includes: CAD, NICM (s/p CRT-D placement), CHF, cardiomegaly, LBBB, HTN, HLD, prediabetes, GERD (on daily PPI), emphysema, HCV (s/p treatment), recurrent LEFT inguinal hernia, umbilical hernia, prostate cancer (s/p brachytherapy), erectile dysfunction (on PDE5i), OA, lumbar DDD, history of substance abuse (THC + crack cocaine).   Patient is followed by cardiology Gala Romney, MD). He was last seen in the cardiology clinic on 03/07/2023; notes reviewed. At the time of his clinic visit, patient complained of being more short of breath. He has experienced  episodes of chest pain. Patient has been off of all of his medications, including those for his chronic cardiovascular diagnoses for at least 6 months. Patient self discontinued medication without input from his care providers citing that there were causing issues with concentration and making him "groggy". Patient denied any PND, orthopnea, palpitations, significant peripheral edema, weakness, vertiginous symptoms, or presyncope/syncope. Patient with a past medical history significant for cardiovascular diagnoses. Documented physical exam was grossly benign, providing no evidence of acute exacerbation of the patient's documented cardiovascular conditions.    Patient with a history of a nonischemic cardiomyopathy.     TTE performed on 09/07/2021 revealed a severely reduced left ventricular systolic function with an EF of 25-30%.   Diagnostic RIGHT/left heart catheterization performed on 09/10/2021 demonstrated minimal nonobstructive CAD with 30% lesion noted in the proximal LCx.  Filling pressures were low; mean PA = 13 mmHg, mean PCWP = 5 mmHg, CO = 6.2 L/min, CI 3.1 L/min/m.   Cardiac MRI performed on 09/11/2021 revealed a severely dilated left ventricle with an EF of 14%.  There was diffuse hypokinesis.  Septal/lateral dyssynchrony from LBBB noted.  There was mild to moderate right ventricular dilatation with severe diastolic dysfunction; RVEF 15%.  Small area of nonspecific basal inferoseptal RV insertion site LGE.   Patient underwent placement of a Saint Jude Quadra Assura CRT-D device on 04/08/2022.  Device is regularly interrogated by patient's primary electrophysiology team.  Device was last remotely interrogated on 01/07/2023, at which time it was noted to be functioning properly.   Most recent TTE was performed on 08/08/2022 revealing a moderately reduced left ventricular systolic function with an EF of 30-35%.  There was global hypokinesis. Left ventricular diastolic Doppler parameters  consistent with abnormal relaxation (G1DD).  Right ventricular size and function  normal.  There was trivial mitral valve regurgitation.  All transvalvular gradients were noted to be normal providing no evidence suggestive of valvular stenosis.  Blood pressure reasonably controlled at 128/90 mmHg without him taking his prescribed antihypertensives. Patient euvolemic on exam. His NICM and resulting CHF were not being treated due to self discontinuation of therapy. Patient does have a diagnosis of being prediabetic.Marland KitchenHe reports that his blood sugars have been well-controlled. Last HgbA1c was 6.0% when checked on 09/08/2021. He does not have an OSAH diagnosis. Patient was having frequent PVCs and was formerly on amiodarone prior to him stopping his medications. Zio patch study was ordered. Functional capacity limited by cardiovascular diagnoses and comorbid conditions, however with that being said, patient still felt to be able to achieve at least 4 METS of physical activity without experiencing any degree of angina/anginal equivalent symptoms. Recommendations regarding reinitiation of pharmacological therapies for his cardiac conditions discussed. Provider stress the importance of continuing treatment in the setting of complex cardiovascular conditions. Patient to work with cardiac pharmacist to reintroduce medications. Repeat TTE also ordered. Patient scheduled to follow-up with outpatient cardiology in 3 months or sooner if needed.   Ruben Duffy underwent robotic assisted repair of an umbilical and BILATERAL inguinal hernias back in 11/2022. Unfortunately, patient has experienced several weeks of pain in his LEFT groin that increases with Valsalva. He has also appreciated bulging within the aforementioned area. Symptoms prompted repeat physical exam by his general surgeon, which ultimately revealed a recurrence of his inguinal hernia on the LEFT. Following consult with surgery, patient has elected to once again   undergo surgical repair of the defect. He has been scheduled for the elective XI ROBOTIC ASSISTED REPAIR OF A RECURRENT LEFT INGUINAL HERNIA on 03/13/2023 with Dr. Sterling Big, MD. Given patient's past medical history significant for cardiovascular diagnoses, presurgical cardiac clearance was sought by the PAT team. Per cardiology, "based ACC/AHA guidelines, the patient's past medical history, and the amount of time since his last clinic visit, this patient would be at an overall ACCEPTABLE risk for the planned procedure without further cardiovascular testing or intervention at this time".   In review of his medication reconciliation, the patient is not noted to be taking any type of anticoagulation or antiplatelet therapies that would need to be held during his perioperative course.  Patient denies previous perioperative complications with anesthesia in the past. In review of the available records, it is noted that patient underwent a general anesthetic course here at [facility] ([facility]) in 11/2022 without documented complications.      03/07/2023   10:49 AM 02/24/2023    1:04 PM 02/13/2023    3:55 PM  Vitals with BMI  Height  5\' 8"  5\' 8"   Weight 175 lbs 176 lbs 170 lbs  BMI  26.77 25.85  Systolic 128 139   Diastolic 90 90   Pulse 77 56     Providers/Specialists:   NOTE: Primary physician provider listed below. Patient may have been seen by APP or partner within same practice.   PROVIDER ROLE / SPECIALTY LAST Cherly Hensen, MD General Surgery (Surgeon) 02/24/2023  Donetta Potts, MD Primary Care Provider ???  Arvilla Meres, MD  Cardiology 03/07/2023  Sherryl Manges, MD  Electrophysiology  07/11/2022   Allergies:  Patient has no known allergies.  Current Home Medications:   No current facility-administered medications for this encounter.    carvedilol (COREG) 3.125 MG tablet   Multiple Vitamin (MULTIVITAMIN  ADULT PO)    sacubitril-valsartan (ENTRESTO) 24-26 MG   History:   Past Medical History:  Diagnosis Date   AICD (automatic cardioverter/defibrillator) present 04/08/2022   a.) St. Jude Quadra Assura CRT-D   Arthritis    Bilateral inguinal hernia    a.) s/p BILATERAL repair 11/19/2022; b.) recurrent hernia on LEFT   CAD (coronary artery disease) 09/10/2021   a.) R/LHC 09/10/2021: 30% pLCx - med mgmt.   Cardiomegaly    CHF (congestive heart failure) (HCC)    a.) TTE 09/07/2021: EF 25-30%, diff inf HK, mod LV dil, mod LAE, mod-sev MR; b.) R/LHC 09/10/2021: EF 25%, mPA 13, PCWP 5, CO 6.2, CI 3.1; c.) cMRI 09/11/2021: EF 14%, sev LV dil, diff HK; d.) TTE 01/11/2022: EF 25-30%, glob HK, mod LV dil, mod LAE, mild MR, G2DD; e.) CRT-D implanted 04/08/2022; f.) TTE 08/08/2022: EF 30-35%, glob HK, triv MR, G1DD   DDD (degenerative disc disease), lumbar    Emphysema of lung (HCC)    Erectile dysfunction    a.) on PDE5i (sildenafil) PRN   GERD (gastroesophageal reflux disease)    Hemorrhoids    Hepatitis C virus infection cured after antiviral drug therapy    History of substance abuse (HCC)    a.) THC + crack cocaine   HLD (hyperlipidemia)    Hypertension    Hypospadias, balanic    LBBB (left bundle branch block) 04/26/2021   a.) noted on preop ECG 04/26/2021   Long term current use of amiodarone    Lower urinary tract symptoms (LUTS)    NICM (nonischemic cardiomyopathy) (HCC)    a.) TTE 09/07/2021: EF 25-30%; b.) R/LHC 09/10/2021: mPA 13, PCWP 5, CO 6.2, CI 3.1; c.) cMRI 09/11/2021: EF 14%, diff HK, RV insertion site LGE; d.) TTE 01/11/2022: EF 25-30% e.) CRT-D implanted 04/08/2022; f.) TTE 08/08/2022: EF 30-35%   Pre-diabetes    Prostate cancer (HCC) 01/08/2021   a.) Gleason 3+4, T2a; b.) s/p brachytherapy 06/05/2021   Umbilical hernia    a.) s/p repair 11/19/2022   Past Surgical History:  Procedure Laterality Date   BIOPSY PROSTATE     BIV ICD INSERTION CRT-D N/A 04/08/2022   Procedure: BIV ICD  INSERTION CRT-D;  Surgeon: Duke Salvia, MD;  Location: [facility] INVASIVE CV LAB;  Service: Cardiovascular;  Laterality: N/A;   CIRCUMCISION N/A 06/05/2021   Procedure: CIRCUMCISION ADULT;  Surgeon: Bjorn Pippin, MD;  Location: [facility];  Service: Urology;  Laterality: N/A;   COLONOSCOPY W/ POLYPECTOMY     HEMORRHOID SURGERY     HYPOSPADIAS CORRECTION     INSERTION OF MESH  11/19/2022   Procedure: INSERTION OF MESH;  Surgeon: Leafy Ro, MD;  Location: [facility] ORS;  Service: General;;   RADIOACTIVE SEED IMPLANT N/A 06/05/2021   Procedure: RADIOACTIVE SEED IMPLANT/BRACHYTHERAPY IMPLANT;  Surgeon: Bjorn Pippin, MD;  Location: [facility];  Service: Urology;  Laterality: N/A;   RIGHT/LEFT HEART CATH AND CORONARY ANGIOGRAPHY N/A 09/10/2021   Procedure: RIGHT/LEFT HEART CATH AND CORONARY ANGIOGRAPHY;  Surgeon: Dolores Patty, MD;  Location: [facility] INVASIVE CV LAB;  Service: Cardiovascular;  Laterality: N/A;   SPACE OAR INSTILLATION N/A 06/05/2021   Procedure: SPACE OAR INSTILLATION;  Surgeon: Bjorn Pippin, MD;  Location: [facility];  Service: Urology;  Laterality: N/A;   UMBILICAL HERNIA REPAIR N/A 11/19/2022   Procedure: HERNIA REPAIR UMBILICAL ADULT;  Surgeon: Leafy Ro, MD;  Location: [facility] ORS;  Service: General;  Laterality: N/A;   Family  History  Problem Relation Age of Onset   Stroke Father    Lung cancer Maternal Aunt    Diabetes Maternal Uncle    Brain cancer Maternal Uncle    Lung cancer Maternal Uncle    Lung cancer Maternal Uncle    Cancer Paternal Grandmother    Diabetes Paternal Grandfather    Colon cancer Neg Hx    Esophageal cancer Neg Hx    Stomach cancer Neg Hx    Pancreatic cancer Neg Hx    Colon polyps Neg Hx    Rectal cancer Neg Hx    Social History   Tobacco Use   Smoking status: Former    Packs/day: 1.75    Years: 30.00    Additional pack years: 0.00    Total pack years: 52.50    Types: Cigarettes    Quit  date: 2009    Years since quitting: 15.4    Passive exposure: Past   Smokeless tobacco: Never  Vaping Use   Vaping Use: Never used  Substance Use Topics   Alcohol use: Not Currently   Drug use: Yes    Types: Marijuana, "Crack" cocaine    Pertinent Clinical Results:  LABS:   Lab Results  Component Value Date   WBC 7.3 02/20/2023   HGB 14.4 02/20/2023   HCT 44.9 02/20/2023   MCV 87.0 02/20/2023   PLT 276 02/20/2023   Lab Results  Component Value Date   NA 139 02/20/2023   K 4.7 02/20/2023   CO2 24 02/20/2023   GLUCOSE 81 02/20/2023   BUN 9 02/20/2023   CREATININE 0.84 02/20/2023   CALCIUM 9.4 02/20/2023   EGFR 102 02/20/2023   GFRNONAA >60 02/20/2023    ECG: Date: 03/03/2023 Time ECG obtained: 0818 AM Rate: 70 bpm Rhythm: atrial-sensed ventricular-paced rhythm Axis (leads I and aVF): Normal Intervals: PR 168 ms. QRS 130 ms. QTc 481 ms. ST segment and T wave changes: No evidence of acute ST segment elevation or depression Comparison: Similar to previous tracing obtained on 12/06/2022   IMAGING / PROCEDURES: CARDIOPULMONARY EXERCISE TEST performed on  01/15/2023 Low normal to mildly reduced functional capacity.  Exercise testing with gas exchange demonstrates normal functional capacity when compared to matched sedentary norms.  There is a mild (if any) HF limitation.  Patient appears primarily ventilatory limited with depletion of breathing reserve, low PETCO2 and is most likely representative of hyperventilation, driving the elevated VE/VCO2 slope, vs. elevated pulmonary pressures or main HF limitation.  There was a mild hypertensive response to exercise.   CT CHEST LUNG CA SCREEN LOW DOSE W/O CM performed on 12/06/2022 Lung-RADS 2S, benign appearance or behavior. Continue annual screening with low-dose chest CT without contrast in 12 months The "S" modifier above refers to potentially clinically significant non lung cancer related findings. Specifically, there  is aortic atherosclerosis, in addition to left anterior descending coronary artery disease. Please note that although the presence of coronary artery calcium documents the presence of coronary artery disease, the severity of this disease and any potential stenosis cannot be assessed on this non-gated CT examination. Assessment for potential risk factor modification, dietary therapy or pharmacologic therapy may be warranted, if clinically indicated. Mild diffuse bronchial wall thickening with mild centrilobular and paraseptal emphysema; imaging findings suggestive of underlying COPD. Aortic atherosclerosis    TRANSTHORACIC ECHOCARDIOGRAM performed on 08/08/2022 Left ventricular ejection fraction, by estimation, is 30 to 35%. The left ventricle has moderately decreased function. The left ventricle demonstrates global hypokinesis. The left ventricular  internal cavity size was mildly dilated. Left ventricular diastolic parameters are consistent with Grade I diastolic dysfunction (impaired relaxation).  Right ventricular systolic function is normal. The right ventricular size is normal. Tricuspid regurgitation signal is inadequate for assessing PA pressure.  The mitral valve is normal in structure. Trivial mitral valve regurgitation. No evidence of mitral stenosis.  The aortic valve is tricuspid. Aortic valve regurgitation is not visualized. No aortic stenosis is present.  The inferior vena cava is normal in size with greater than 50% respiratory variability, suggesting right atrial pressure of 3 mmHg.    MR LUMBAR SPINE W/O CONTRAST performed on 03/08/2022 Multilevel degenerative changes. Facet arthropathy is greatest at L2-L3, L3-L4, and L5-S1.  No high-grade canal stenosis.  Foraminal narrowing is greatest on the right at L3-L4 and on the left at L5-S1.  Right subarticular recess narrowing is present at L3-L4.   MR CARDIAC MORPHOLOGY W WO CONTRAST performed on 08/11/2021 Severely dilated LV with EF  14%, diffuse hypokinesis. EF is difficult to quantify by Simpson's rule give septal-lateral dyssynchrony from LBBB, but it is quite low. Mild-moderate RV dilation with severe systolic dysfunction, EF 15%. Small area of nonspecific basal inferoseptal RV insertion site LGE, this is often seen with pressure/volume overload. Nonspecific mild elevation in extracellular volume percentage, not in the range to suggest cardiac amyloidosis.   RIGHT/LEFT HEART CATHETERIZATION AND CORONARY ANGIOGRAPHY performed on 09/10/2021 Severe NICM with an EF of 25% Minimal nonobstructive single-vessel CAD 30% proximal LCx Hemodynamics Ao = 71/49 (58) -> 83/52 (after IVF)  LV = 86/5 RA = 1 RV = 23/1 PA = 24/2 (13) PCW = 5 Fick cardiac output/index = 6.2/3.1 PVR = 1.3 WU SVR = 728 FA sat = 93% PA sat = 69%, 68% SVC sat = 74%   Impression and Plan:  Ruben Duffy has been referred for pre-anesthesia review and clearance prior to him undergoing the planned anesthetic and procedural courses. Available labs, pertinent testing, and imaging results were personally reviewed by me in preparation for upcoming operative/procedural course. [facility] medical record has been updated following extensive record review and patient interview with PAT staff.   This patient has been appropriately cleared by cardiology with an overall ACCEPTABLE risk of experiencing significant perioperative cardiovascular complications. Electrophysiology indicating that procedure may interfere with planned surgical procedure. They are recommending intraoperative magnet placement and normal perioperative cardiovascular monitoring. Beyond the aforementioned, there are no recommendations from patient's electrophysiology team that have prompted further discussion/recommendations from industry representative.    Based on clinical review performed today (03/07/23), barring any significant acute changes in the patient's overall condition, it is  anticipated that he will be able to proceed with the planned surgical intervention. Any acute changes in clinical condition may necessitate his procedure being postponed and/or cancelled. Patient will meet with anesthesia team (MD and/or CRNA) on the day of his procedure for preoperative evaluation/assessment. Questions regarding anesthetic course will be fielded at that time.   Pre-surgical instructions were reviewed with the patient during his PAT appointment, and questions were fielded to satisfaction by PAT clinical staff. He has been instructed on which medications that he will need to hold prior to surgery, as well as the ones that have been deemed safe/appropriate to take on the day of his procedure. As part of the general education provided by PAT, patient made aware both verbally and in writing, that he would need to abstain from the use of any illegal substances during his perioperative course.  He was advised  that failure to follow the provided instructions could necessitate case cancellation or result in serious perioperative complications up to and including death. Patient encouraged to contact PAT and/or his surgeon's office to discuss any questions or concerns that may arise prior to surgery; verbalized understanding.   Quentin Mulling, MSN, APRN, FNP-C, CEN [facility]  Peri-operative Services Nurse Practitioner Phone: [phone] Fax: [phone] 03/07/23 6:20 PM  NOTE: This note has been prepared using Dragon dictation software. Despite my best ability to proofread, there is always the potential that unintentional transcriptional errors may still occur from this process.

## 2023-03-07 NOTE — Progress Notes (Signed)
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  He was seen by Dr Allyson Sabal 04/2021 for preop eval before radioactive seed implant and circumcision. He had LBBB of unknown duration, BP well-controlled. No further workup pre-procedure.  Admitted 11/22 for a/c systolic HF. Echo EF 20-25%. LV dilated (6.5cm) with severe central MR. Global HK with ? AK of anteroseptum.  Diuresed with IV lasix. Initiated on GDMT with digoxin, spironolactone, Entresto and Farxiga. Frequent PVCs, started on amio w/ reduction in PVC burden. R/LHC showed minimal nonobstructive CAD, cMRI showed LVEF 14%, diffuse HK, mild/mod RV dilation w/ severe systolic dysfunction, RVEF 15%, small nonspecific basal inferoseptal RV insertion site LGE, mild elevation in ECV, not suggestive of cardiac amyloidosis. 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 LifeVest placed  Home sleep study 12/22 AHI 0.6/hr  Echo 01/11/22: EF 25-30% LV markedly dilated. Severe dyssynchrony. RV normal. Mild to moderate MR. Referred to EP for CRT-D.   Underwent CRT-D implant 6/23.   Follow up 10/23, still with NYHA II-III symptoms despite CRT-D, volume ok. Echo repeated, considering CPX if no better. Amio decreased to 100 daily.  Echo (10/23): EF 30-35%, moderate LV dysfunction with global HK, grade I DD, RV ok  S/p laparoscopic bilateral inguinal hernia repair and open ventral hernia repair (2/24). Follow up 2/24, felt like he had more energy after CRT-D. NYHA II and volume OK. CPX arranged which showed mild HF limitation.  Today he returns for HF follow up, needs surgical clearance for left hernia repair. 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 Symptoms improved about 6 weeks after stopping meds. More SOB now, dyspnea with walking up hills. Had episode of chest pain while driving last week, resolved spontaneously. Denies palpitations, dizziness, edema, or PND/Orthopnea. Appetite ok. No fever or chills. Weight at home 170 pounds. Has left hernia surgery on Thursday at [facility].  Bedside echo today 03/07/23, EF 30%   Cardiac Studies:  - CPX (4/24):  Pre-Exercise PFTs: FVC 3.64 (81%)      FEV1 2.77 (79%)        FEV1/FVC 76 (97%)        MVV 99 (72%)   Peak VO2: 28.4 (87% predicted peak VO2)  VE/VCO2 slope:  37  OUES: 2.58  Peak RER: 1.12  Ventilatory Threshold: 19.8 (57% predicted or measured peak VO2)  Peak RR 45  Peak Ventilation:  100.9  VE/MVV:  102%  PETCO2 at peak:  30   - Echo (10/23): EF 30-35%, moderate LV dysfunction with global HK, grade I DD, RV ok  - Echo (3/23): EF 25-30%, LV markedly dilated, severe dyssynchrony, RV ok, mild to moderate MR.  - Cardiac MRI (11/22):  LVEF 14%, diffuse HK, mild/mod RV dilation w/ severe systolic dysfunction, RVEF 15%, small nonspecific basal inferoseptal RV insertion site LGE, mild elevation in ECV, not suggestive of cardiac amyloidosis.   - Echo (11/22): EF 20-25%. LV dilated (6.5cm) with severe central MR. Global HK with ? AK of anteroseptum   - RHC/LHC (11/22) with minimal obstructive CAD, low filling pressure, normal output and severe NICM   ROS: All systems reviewed and negative except as per HPI.  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 Jude Quadra Assura CRT-D   Arthritis    Bilateral inguinal hernia    a.) s/p BILATERAL repair 11/19/2022; b.) recurrent hernia on LEFT   CAD (coronary artery disease) 09/10/2021   a.) R/LHC 09/10/2021: 30% pLCx - med mgmt.   Cardiomegaly    CHF (congestive heart failure) (HCC)    a.) TTE 09/07/2021: EF 25-30%, diff inf HK, mod LV dil, mod LAE,  mod-sev MR; b.) R/LHC 09/10/2021: EF 25%, mPA 13, PCWP 5, CO 6.2, CI 3.1; c.) cMRI 09/11/2021: EF 14%, sev LV dil, diff HK; d.) TTE 01/11/2022: EF 25-30%, glob HK, mod LV dil, mod LAE, mild MR, G2DD; e.) CRT-D implanted 04/08/2022; f.) TTE 08/08/2022: EF 30-35%, glob HK, triv MR, G1DD   DDD (degenerative disc disease), lumbar    Emphysema of lung (HCC)    Erectile dysfunction    a.) on PDE5i (sildenafil) PRN   GERD (gastroesophageal reflux disease)    Hemorrhoids    Hepatitis C virus infection cured after antiviral drug therapy    History of substance abuse (HCC)    a.) THC + crack cocaine   HLD (hyperlipidemia)    Hypertension    Hypospadias, balanic    LBBB (left bundle branch block) 04/26/2021   a.) noted on preop ECG 04/26/2021   Long term current use of amiodarone    Lower urinary tract symptoms (LUTS)    NICM (nonischemic cardiomyopathy) (HCC)    a.) TTE 09/07/2021: EF 25-30%; b.) R/LHC 09/10/2021: mPA 13, PCWP 5, CO 6.2, CI 3.1; c.) cMRI 09/11/2021: EF 14%, diff HK, RV insertion site LGE; d.) TTE 01/11/2022: EF 25-30% e.) CRT-D implanted 04/08/2022; f.) TTE 08/08/2022: EF 30-35%   Pre-diabetes    Prostate cancer (HCC) 01/08/2021   a.) Gleason 3+4, T2a; b.) s/p brachytherapy 06/05/2021   Umbilical hernia    a.) s/p repair 11/19/2022    Current Outpatient Medications  Medication Sig Dispense Refill   Multiple Vitamin (MULTIVITAMIN ADULT PO) Take by mouth daily.     No current facility-administered medications for this encounter.   No Known Allergies  Social History   Socioeconomic History   Marital status: Single    Spouse name: Not on file   Number of children: 1   Years of education: Not on file   Highest education level: Not on file  Occupational History   Occupation: [employer]    Comment: Estate agent  Tobacco Use   Smoking status: Former    Packs/day: 1.75    Years: 30.00    Additional pack years: 0.00    Total pack years: 52.50    Types:  Cigarettes    Quit date: 2009    Years since quitting: 15.4    Passive exposure: Past   Smokeless tobacco: Never  Vaping Use   Vaping Use: Never used  Substance and Sexual Activity   Alcohol use: Not Currently   Drug use: Yes    Types: Marijuana, "Crack" cocaine   Sexual activity: Yes  Other Topics Concern   Not on file  Social History Narrative   Lives with mother   Social Determinants of Health   Financial Resource Strain: Low Risk  (07/27/2021)   Overall Financial Resource Strain (CARDIA)    Difficulty of Paying Living Expenses: Not hard at all  Food Insecurity: No Food Insecurity (07/27/2021)   Hunger Vital Sign    Worried About Running Out  of Food in the Last Year: Never true    Ran Out of Food in the Last Year: Never true  Transportation Needs: No Transportation Needs (07/27/2021)   PRAPARE - Administrator, Civil Service (Medical): No    Lack of Transportation (Non-Medical): No  Physical Activity: Inactive (07/27/2021)   Exercise Vital Sign    Days of Exercise per Week: 0 days    Minutes of Exercise per Session: 0 min  Stress: No Stress Concern Present (07/27/2021)   [employer] of Occupational Health - Occupational Stress Questionnaire    Feeling of Stress : Not at all  Social Connections: Socially Isolated (07/27/2021)   Social Connection and Isolation Panel [NHANES]    Frequency of Communication with Friends and Family: Three times a week    Frequency of Social Gatherings with Friends and Family: Three times a week    Attends Religious Services: Never    Active Member of Clubs or Organizations: No    Attends Banker Meetings: Never    Marital Status: Never married  Intimate Partner Violence: Not At Risk (07/27/2021)   Humiliation, Afraid, Rape, and Kick questionnaire    Fear of Current or Ex-Partner: No    Emotionally Abused: No    Physically Abused: No    Sexually Abused: No   Family History  Problem Relation Age of Onset    Stroke Father    Lung cancer Maternal Aunt    Diabetes Maternal Uncle    Brain cancer Maternal Uncle    Lung cancer Maternal Uncle    Lung cancer Maternal Uncle    Cancer Paternal Grandmother    Diabetes Paternal Grandfather    Colon cancer Neg Hx    Esophageal cancer Neg Hx    Stomach cancer Neg Hx    Pancreatic cancer Neg Hx    Colon polyps Neg Hx    Rectal cancer Neg Hx    BP (!) 128/90   Pulse 77   Wt 79.4 kg (175 lb)   SpO2 97%   BMI 26.61 kg/m   Wt Readings from Last 3 Encounters:  03/07/23 79.4 kg (175 lb)  02/24/23 79.8 kg (176 lb)  02/13/23 77.1 kg (170 lb)   PHYSICAL EXAM: General:  NAD. No resp difficulty HEENT: Normal Neck: Supple. JVP 8-9. Carotids 2+ bilat; no bruits. No lymphadenopathy or thryomegaly appreciated. Cor: PMI nondisplaced. Regular rate & rhythm. No rubs, gallops or murmurs. Lungs: Clear Abdomen: Soft, nontender, nondistended. No hepatosplenomegaly. No bruits or masses. Good bowel sounds. Extremities: No cyanosis, clubbing, rash, edema Neuro: Alert & oriented x 3, cranial nerves grossly intact. Moves all 4 extremities w/o difficulty. Affect pleasant.  ECG (personally reviewed): A sensed, V paced; 70 bpm  Device interrogation (personally reviewed): 97% BiV pacing, <1 % A pacing, no AT/AF, CorVue stable   ReDs: 34%  ASSESSMENT & PLAN:   1. Chronic Systolic HF due to NICM - Echo (11/22): EF 20-25%. LV dilated (6.5cm) with severe central MR. Global HK with ? AK of anteroseptum  - RHC/LHC (11/22) with minimal obstructive CAD, low filling pressure, normal output and severe NICM - cMRI (11/22): LVEF 14%, diffuse HK, mild/mod RV dilation w/ severe systolic dysfunction, RVEF 15%, small nonspecific basal inferoseptal RV insertion site LGE, mild elevation in ECV, not suggestive of cardiac amyloidosis. - Echo (3/23): EF 25-30% LV markedly dilated. Severe dyssynchrony. RV normal. Mild to moderate MR  - Suspected LBBB CMP (+/- PVC CM), now s/p CRT-D  6/23 - Echo (  10/23): EF 30-35%, RV ok - CPX (4/24) showed mild HF limitation, slope 37 - Bedside echo today 03/07/23 EF 30% - NYHA II-early III today, volume looks stable on exam and by device interrogation, ReDs 34% - Has been off all GDMT x 6 months.  - Restart Entresto 24/26 mg bid. - Restart Coreg 3.125 mg bid. - Plan to add spiro and Farxiga over next several visits - Labs today. - Plan formal echo in 3 months, after back on GDMT consistently.  2. Frequent PVCs - Previously on amiodarone, now stopped.  - None on ECG today. Appropriate BiV pacing on ICD - Sleep study normal - Plan to place Zio 2 week at next visit to follow PVC burden off amiodarone.   3. Severe mitral regurgitation - Functional. - Mild to moderate MR on echo 3/23. - Now trivial MR on echo 10/23   4. COPD - Quit smoking 2009. - Stable, no change.   5. Pre-Diabetes - A1C 6.0 - Restart SGLT2i soon.   6. H/o prostate CA - s/p radioactive seeds   7. Snoring - Home sleep study (12/22) AHI 0.6/hr  8. Pre-Operative Clearance - OK for surgery from cardiac standpoint  Follow up with PharmD x 2 visits (will need Zio 2 week & BMET) for GDMT titration, follow up with Ruben Duffy + echo in 3 months.   [facility] FNP-BC 03/07/23  10:56 AM  Patient seen and examined with the above-signed Advanced Practice Provider and/or Housestaff. I personally reviewed laboratory data, imaging studies and relevant notes. I independently examined the patient and formulated the important aspects of the plan. I have edited the note to reflect any of my changes or salient points. I have personally discussed the plan with the patient and/or family.  56 y/o male as above with systolic HF due to NICM. Presents today for pre-op evaluation prior to left hernia repair. Has been off all GDMT x 6 months because he felt groggy. Symptoms improved about 6 weeks after stopping  Says he feels fine. Able to do his ADLs without  limitation. Does get SOB if he goes up hills  I did bedside echo personally EF 30%  General:  Well appearing. No resp difficulty HEENT: normal Neck: supple. JVP 8-9 Carotids 2+ bilat; no bruits. No lymphadenopathy or thryomegaly appreciated. Cor: PMI nondisplaced. Regular rate & rhythm. No rubs, gallops or murmurs. Lungs: clear Abdomen: soft, nontender, nondistended. No hepatosplenomegaly. No bruits or masses. Good bowel sounds. Extremities: no cyanosis, clubbing, rash, edema Neuro: alert & orientedx3, cranial nerves grossly intact. moves all 4 extremities w/o difficulty. Affect pleasant  He is mildly fluid overloaded and off GDMT but overall doing pretty well. We will restart diuretics and GDMT as above.   Given relatively preserved functional capacity, I think he is low to moderate risk for peri-op CV complications and can proceed once volume status improved.   Total time spent 40 minutes. Over half that time spent discussing above.   Arvilla Meres, MD  6:50 PM

## 2023-03-07 NOTE — Patient Instructions (Addendum)
Medication Changes:  RESTART Coreg 3.125 mg Twice a day.   RESTART Entresto 24/26 mg twice a day.    *If you need a refill on your cardiac medications before your next appointment, please call your pharmacy*  Lab Work:  Labs done today, your results will be available in MyChart, we will contact you for abnormal readings.  Follow-Up in:   Your physician recommends that you schedule a follow-up appointment in: 3 months with Dr. Gala Romney   Your physician has requested that you have an echocardiogram. Echocardiography is a painless test that uses sound waves to create images of your heart. It provides your doctor with information about the size and shape of your heart and how well your heart's chambers and valves are working. This procedure takes approximately one hour. There are no restrictions for this procedure. Please do NOT wear cologne, perfume, aftershave, or lotions (deodorant is allowed). Please arrive 15 minutes prior to your appointment time.  Do the following things EVERYDAY: Weigh yourself in the morning before breakfast. Write it down and keep it in a log. Take your medicines as prescribed Eat low salt foods--Limit salt (sodium) to 2000 mg per day.  Stay as active as you can everyday Limit all fluids for the day to less than 2 liters    Need to Contact us:  If you have any questions or concerns before your next appointment please send [place] a message through [place] or call our office at [phone].    TO LEAVE A MESSAGE FOR THE NURSE SELECT OPTION 2, PLEASE LEAVE A MESSAGE INCLUDING: YOUR NAME DATE OF BIRTH CALL BACK NUMBER REASON FOR CALL**this is important as we prioritize the call backs  YOU WILL RECEIVE A CALL BACK THE SAME DAY AS LONG AS YOU CALL BEFORE 4:00 PM   At the [facility], you and your health needs are our priority. As part of our continuing mission to provide you with exceptional heart care, we have created designated Provider Care  Teams. These Care Teams include your primary Cardiologist (physician) and Advanced Practice Providers (APPs- Physician Assistants and Nurse Practitioners) who all work together to provide you with the care you need, when you need it.   You may see any of the following providers on your designated Care Team at your next follow up: Dr Arvilla Meres Dr Marca Ancona Dr. Marcos Eke, NP Robbie Lis, [place] [facility] [place], [place] Brynda Peon, NP Karle Plumber, PharmD   Please be sure to bring in all your medications bottles to every appointment.    Thank you for choosing [facility]

## 2023-03-07 NOTE — Progress Notes (Signed)
ReDS Vest / Clip - 03/07/23 1100       ReDS Vest / Clip   Station Marker C    Ruler Value 29    ReDS Value Range Low volume    ReDS Actual Value 34

## 2023-03-13 ENCOUNTER — Ambulatory Visit: Payer: 59 | Admitting: Urgent Care

## 2023-03-13 ENCOUNTER — Other Ambulatory Visit: Payer: Self-pay

## 2023-03-13 ENCOUNTER — Encounter: Payer: Self-pay | Admitting: Surgery

## 2023-03-13 ENCOUNTER — Ambulatory Visit
Admission: RE | Admit: 2023-03-13 | Discharge: 2023-03-13 | Disposition: A | Discharge status: Home / Self Care | Payer: 59 | Source: Ambulatory Visit | Attending: Surgery | Admitting: Surgery

## 2023-03-13 ENCOUNTER — Encounter
Admission: RE | Disposition: A | Discharge status: Home / Self Care | Payer: Self-pay | Source: Ambulatory Visit | Attending: Surgery

## 2023-03-13 DIAGNOSIS — I2510 Atherosclerotic heart disease of native coronary artery without angina pectoris: Secondary | ICD-10-CM | POA: Diagnosis not present

## 2023-03-13 DIAGNOSIS — I110 Hypertensive heart disease with heart failure: Secondary | ICD-10-CM | POA: Insufficient documentation

## 2023-03-13 DIAGNOSIS — I5022 Chronic systolic (congestive) heart failure: Secondary | ICD-10-CM | POA: Diagnosis not present

## 2023-03-13 DIAGNOSIS — I509 Heart failure, unspecified: Secondary | ICD-10-CM | POA: Insufficient documentation

## 2023-03-13 DIAGNOSIS — Z95810 Presence of automatic (implantable) cardiac defibrillator: Secondary | ICD-10-CM | POA: Diagnosis not present

## 2023-03-13 DIAGNOSIS — J439 Emphysema, unspecified: Secondary | ICD-10-CM | POA: Insufficient documentation

## 2023-03-13 DIAGNOSIS — K4091 Unilateral inguinal hernia, without obstruction or gangrene, recurrent: Secondary | ICD-10-CM

## 2023-03-13 DIAGNOSIS — Z87891 Personal history of nicotine dependence: Secondary | ICD-10-CM | POA: Insufficient documentation

## 2023-03-13 DIAGNOSIS — K4090 Unilateral inguinal hernia, without obstruction or gangrene, not specified as recurrent: Secondary | ICD-10-CM

## 2023-03-13 HISTORY — DX: Unspecified hemorrhoids: K64.9

## 2023-03-13 HISTORY — DX: Bilateral inguinal hernia, without obstruction or gangrene, not specified as recurrent: K40.20

## 2023-03-13 SURGERY — HERNIORRHAPHY, INGUINAL, ROBOT-ASSISTED, LAPAROSCOPIC
Anesthesia: General | Laterality: Left

## 2023-03-13 MED ORDER — HYDROMORPHONE HCL 1 MG/ML IJ SOLN
INTRAMUSCULAR | Status: DC | PRN
  Administered 2023-03-13: 1 mg via INTRAVENOUS

## 2023-03-13 MED ORDER — HYDROCODONE-ACETAMINOPHEN 5-325 MG PO TABS
1.0000 | ORAL_TABLET | Freq: Four times a day (QID) | ORAL | 0 refills | Status: DC | PRN
Start: 2023-03-13 — End: 2023-03-31

## 2023-03-13 MED ORDER — FENTANYL CITRATE (PF) 100 MCG/2ML IJ SOLN: 25.0000 ug | INTRAMUSCULAR | Status: DC | PRN

## 2023-03-13 MED ORDER — FAMOTIDINE 20 MG PO TABS
ORAL_TABLET | ORAL | Status: AC
  Filled 2023-03-13: qty 1

## 2023-03-13 MED ORDER — CHLORHEXIDINE GLUCONATE CLOTH 2 % EX PADS: 6.0000 | MEDICATED_PAD | Freq: Once | CUTANEOUS | Status: DC

## 2023-03-13 MED ORDER — GABAPENTIN 300 MG PO CAPS
ORAL_CAPSULE | ORAL | Status: AC
  Filled 2023-03-13: qty 1

## 2023-03-13 MED ORDER — CELECOXIB 200 MG PO CAPS
ORAL_CAPSULE | ORAL | Status: AC
  Filled 2023-03-13: qty 1

## 2023-03-13 MED ORDER — ONDANSETRON HCL 4 MG/2ML IJ SOLN
INTRAMUSCULAR | Status: AC
  Filled 2023-03-13: qty 2

## 2023-03-13 MED ORDER — SUGAMMADEX SODIUM 200 MG/2ML IV SOLN
INTRAVENOUS | Status: DC | PRN
  Administered 2023-03-13: 200 mg via INTRAVENOUS

## 2023-03-13 MED ORDER — CELECOXIB 200 MG PO CAPS
200.0000 mg | ORAL_CAPSULE | ORAL | Status: AC
  Administered 2023-03-13: 200 mg via ORAL

## 2023-03-13 MED ORDER — PHENYLEPHRINE HCL-NACL 20-0.9 MG/250ML-% IV SOLN
INTRAVENOUS | Status: AC
  Filled 2023-03-13: qty 250

## 2023-03-13 MED ORDER — ACETAMINOPHEN 500 MG PO TABS
1000.0000 mg | ORAL_TABLET | ORAL | Status: AC
  Administered 2023-03-13: 1000 mg via ORAL

## 2023-03-13 MED ORDER — ROCURONIUM BROMIDE 100 MG/10ML IV SOLN
INTRAVENOUS | Status: DC | PRN
  Administered 2023-03-13: 70 mg via INTRAVENOUS
  Administered 2023-03-13: 30 mg via INTRAVENOUS

## 2023-03-13 MED ORDER — BUPIVACAINE-EPINEPHRINE (PF) 0.25% -1:200000 IJ SOLN
INTRAMUSCULAR | Status: DC | PRN
  Administered 2023-03-13: 50 mL

## 2023-03-13 MED ORDER — HYDROMORPHONE HCL 1 MG/ML IJ SOLN
INTRAMUSCULAR | Status: AC
  Filled 2023-03-13: qty 1

## 2023-03-13 MED ORDER — FENTANYL CITRATE (PF) 100 MCG/2ML IJ SOLN
INTRAMUSCULAR | Status: DC | PRN
  Administered 2023-03-13 (×2): 100 ug via INTRAVENOUS

## 2023-03-13 MED ORDER — FENTANYL CITRATE (PF) 100 MCG/2ML IJ SOLN
INTRAMUSCULAR | Status: AC
  Filled 2023-03-13: qty 2

## 2023-03-13 MED ORDER — ORAL CARE MOUTH RINSE: 15.0000 mL | Freq: Once | OROMUCOSAL | Status: AC

## 2023-03-13 MED ORDER — BUPIVACAINE HCL (PF) 0.25 % IJ SOLN
INTRAMUSCULAR | Status: AC
  Filled 2023-03-13: qty 30

## 2023-03-13 MED ORDER — EPINEPHRINE PF 1 MG/ML IJ SOLN
INTRAMUSCULAR | Status: AC
  Filled 2023-03-13: qty 1

## 2023-03-13 MED ORDER — BUPIVACAINE LIPOSOME 1.3 % IJ SUSP
INTRAMUSCULAR | Status: AC
  Filled 2023-03-13: qty 20

## 2023-03-13 MED ORDER — PHENYLEPHRINE HCL (PRESSORS) 10 MG/ML IV SOLN
INTRAVENOUS | Status: DC | PRN
  Administered 2023-03-13: 80 ug via INTRAVENOUS

## 2023-03-13 MED ORDER — 0.9 % SODIUM CHLORIDE (POUR BTL) OPTIME
TOPICAL | Status: DC | PRN
  Administered 2023-03-13: 500 mL

## 2023-03-13 MED ORDER — DEXAMETHASONE SODIUM PHOSPHATE 10 MG/ML IJ SOLN
INTRAMUSCULAR | Status: DC | PRN
  Administered 2023-03-13: 10 mg via INTRAVENOUS

## 2023-03-13 MED ORDER — NOREPINEPHRINE 4 MG/250ML-% IV SOLN
INTRAVENOUS | Status: AC
  Filled 2023-03-13: qty 250

## 2023-03-13 MED ORDER — METOPROLOL TARTRATE 5 MG/5ML IV SOLN
INTRAVENOUS | Status: DC | PRN
  Administered 2023-03-13: 2 mg via INTRAVENOUS

## 2023-03-13 MED ORDER — OXYCODONE HCL 5 MG/5ML PO SOLN: 5.0000 mg | Freq: Once | ORAL | Status: DC | PRN

## 2023-03-13 MED ORDER — OXYCODONE HCL 5 MG PO TABS: 5.0000 mg | ORAL_TABLET | Freq: Once | ORAL | Status: DC | PRN

## 2023-03-13 MED ORDER — DEXAMETHASONE SODIUM PHOSPHATE 10 MG/ML IJ SOLN
INTRAMUSCULAR | Status: AC
  Filled 2023-03-13: qty 1

## 2023-03-13 MED ORDER — GABAPENTIN 300 MG PO CAPS
300.0000 mg | ORAL_CAPSULE | ORAL | Status: AC
  Administered 2023-03-13: 300 mg via ORAL

## 2023-03-13 MED ORDER — ALBUMIN HUMAN 5 % IV SOLN
INTRAVENOUS | Status: AC
  Filled 2023-03-13: qty 750

## 2023-03-13 MED ORDER — ACETAMINOPHEN 500 MG PO TABS
ORAL_TABLET | ORAL | Status: AC
  Filled 2023-03-13: qty 2

## 2023-03-13 MED ORDER — PROPOFOL 10 MG/ML IV BOLUS
INTRAVENOUS | Status: DC | PRN
  Administered 2023-03-13: 100 mg via INTRAVENOUS

## 2023-03-13 MED ORDER — SODIUM CHLORIDE 0.9 % IV SOLN: INTRAVENOUS | Status: DC | PRN

## 2023-03-13 MED ORDER — LIDOCAINE HCL (CARDIAC) PF 100 MG/5ML IV SOSY
PREFILLED_SYRINGE | INTRAVENOUS | Status: DC | PRN
  Administered 2023-03-13: 100 mg via INTRAVENOUS

## 2023-03-13 MED ORDER — LIDOCAINE HCL (PF) 2 % IJ SOLN
INTRAMUSCULAR | Status: AC
  Filled 2023-03-13: qty 5

## 2023-03-13 MED ORDER — ROCURONIUM BROMIDE 10 MG/ML (PF) SYRINGE
PREFILLED_SYRINGE | INTRAVENOUS | Status: AC
  Filled 2023-03-13: qty 20

## 2023-03-13 MED ORDER — CHLORHEXIDINE GLUCONATE 0.12 % MT SOLN
OROMUCOSAL | Status: AC
  Filled 2023-03-13: qty 15

## 2023-03-13 MED ORDER — MIDAZOLAM HCL 2 MG/2ML IJ SOLN
INTRAMUSCULAR | Status: DC | PRN
  Administered 2023-03-13: 2 mg via INTRAVENOUS

## 2023-03-13 MED ORDER — LACTATED RINGERS IV SOLN: INTRAVENOUS | Status: DC

## 2023-03-13 MED ORDER — FAMOTIDINE 20 MG PO TABS
20.0000 mg | ORAL_TABLET | Freq: Once | ORAL | Status: AC
  Administered 2023-03-13: 20 mg via ORAL

## 2023-03-13 MED ORDER — CHLORHEXIDINE GLUCONATE 0.12 % MT SOLN
15.0000 mL | Freq: Once | OROMUCOSAL | Status: AC
  Administered 2023-03-13: 15 mL via OROMUCOSAL

## 2023-03-13 MED ORDER — MIDAZOLAM HCL 2 MG/2ML IJ SOLN
INTRAMUSCULAR | Status: AC
  Filled 2023-03-13: qty 2

## 2023-03-13 MED ORDER — ONDANSETRON HCL 4 MG/2ML IJ SOLN
INTRAMUSCULAR | Status: DC | PRN
  Administered 2023-03-13: 4 mg via INTRAVENOUS

## 2023-03-13 MED ORDER — CEFAZOLIN SODIUM-DEXTROSE 2-4 GM/100ML-% IV SOLN
2.0000 g | INTRAVENOUS | Status: AC
  Administered 2023-03-13: 2 g via INTRAVENOUS

## 2023-03-13 SURGICAL SUPPLY — 52 items
ADH SKN CLS APL DERMABOND .7 (GAUZE/BANDAGES/DRESSINGS) ×1
CANNULA REDUCER 12-8 DVNC XI (CANNULA) ×1 IMPLANT
COVER TIP SHEARS 8 DVNC (MISCELLANEOUS) ×1 IMPLANT
COVER WAND RF STERILE (DRAPES) ×1 IMPLANT
DERMABOND ADVANCED .7 DNX12 (GAUZE/BANDAGES/DRESSINGS) ×1 IMPLANT
DRAPE ARM DVNC X/XI (DISPOSABLE) ×3 IMPLANT
DRAPE COLUMN DVNC XI (DISPOSABLE) ×1 IMPLANT
DRAPE SHEET LG 3/4 BI-LAMINATE (DRAPES) IMPLANT
ELECT CAUTERY BLADE 6.4 (BLADE) ×1 IMPLANT
ELECT REM PT RETURN 9FT ADLT (ELECTROSURGICAL) ×1
ELECTRODE REM PT RTRN 9FT ADLT (ELECTROSURGICAL) ×1 IMPLANT
FORCEPS BPLR R/ABLATION 8 DVNC (INSTRUMENTS) ×1 IMPLANT
FORCEPS PROGRASP DVNC XI (FORCEP) ×1 IMPLANT
GLOVE BIO SURGEON STRL SZ7 (GLOVE) ×4 IMPLANT
GOWN STRL REUS W/ TWL LRG LVL3 (GOWN DISPOSABLE) ×4 IMPLANT
GOWN STRL REUS W/TWL LRG LVL3 (GOWN DISPOSABLE) ×4
IRRIGATION STRYKERFLOW (MISCELLANEOUS) ×1 IMPLANT
IRRIGATOR STRYKERFLOW (MISCELLANEOUS)
IV NS 1000ML (IV SOLUTION)
IV NS 1000ML BAXH (IV SOLUTION) IMPLANT
KIT PINK PAD W/HEAD ARE REST (MISCELLANEOUS) ×1
KIT PINK PAD W/HEAD ARM REST (MISCELLANEOUS) ×1 IMPLANT
LABEL OR SOLS (LABEL) ×1 IMPLANT
MANIFOLD NEPTUNE II (INSTRUMENTS) ×1 IMPLANT
MESH 3DMAX 4X6 LT LRG (Mesh General) IMPLANT
MESH 3DMAX 4X6 RT LRG (Mesh General) IMPLANT
MESH 3DMAX 5X7 LT XLRG (Mesh General) IMPLANT
MESH 3DMAX 5X7 RT XLRG (Mesh General) IMPLANT
NDL DRIVE SUT CUT DVNC (INSTRUMENTS) ×1 IMPLANT
NDL HYPO 22X1.5 SAFETY MO (MISCELLANEOUS) ×1 IMPLANT
NEEDLE DRIVE SUT CUT DVNC (INSTRUMENTS) ×1 IMPLANT
NEEDLE HYPO 22X1.5 SAFETY MO (MISCELLANEOUS) ×1 IMPLANT
NS IRRIG 500ML POUR BTL (IV SOLUTION) IMPLANT
OBTURATOR OPTICAL STND 8 DVNC (TROCAR) ×1
OBTURATOR OPTICALSTD 8 DVNC (TROCAR) ×1 IMPLANT
PACK LAP CHOLECYSTECTOMY (MISCELLANEOUS) ×1 IMPLANT
PENCIL SMOKE EVACUATOR (MISCELLANEOUS) ×1 IMPLANT
SCISSORS MNPLR CVD DVNC XI (INSTRUMENTS) ×1 IMPLANT
SEAL UNIV 5-12 XI (MISCELLANEOUS) ×2 IMPLANT
SET TUBE SMOKE EVAC HIGH FLOW (TUBING) ×1 IMPLANT
SOL ELECTROSURG ANTI STICK (MISCELLANEOUS) ×1
SOLUTION ELECTROSURG ANTI STCK (MISCELLANEOUS) ×1 IMPLANT
SPONGE T-LAP 18X18 ~~LOC~~+RFID (SPONGE) ×1 IMPLANT
SUT MNCRL AB 4-0 PS2 18 (SUTURE) ×1 IMPLANT
SUT V-LOC 90 ABS 3-0 VLT  V-20 (SUTURE) ×2
SUT V-LOC 90 ABS 3-0 VLT V-20 (SUTURE) ×2 IMPLANT
SUT VICRYL 0 UR6 27IN ABS (SUTURE) ×2 IMPLANT
SUT VLOC 90 S/L VL9 GS22 (SUTURE) IMPLANT
SYR 30ML LL (SYRINGE) ×1 IMPLANT
TAPE TRANSPORE STRL 2 31045 (GAUZE/BANDAGES/DRESSINGS) ×1 IMPLANT
TRAP FLUID SMOKE EVACUATOR (MISCELLANEOUS) ×1 IMPLANT
WATER STERILE IRR 500ML POUR (IV SOLUTION) ×1 IMPLANT

## 2023-03-13 NOTE — Interval H&P Note (Signed)
History and Physical Interval Note:  03/13/2023 7:42 AM  Ruben Duffy  has presented today for surgery, with the diagnosis of recurrent inguinal hernia.  The various methods of treatment have been discussed with the patient and family. After consideration of risks, benefits and other options for treatment, the patient has consented to  Procedure(s): XI ROBOTIC ASSISTED INGUINAL HERNIA, recurrent (Left) as a surgical intervention.  The patient's history has been reviewed, patient examined, no change in status, stable for surgery.  I have reviewed the patient's chart and labs.  Questions were answered to the patient's satisfaction.     Lindyn Vossler F Kooper [place]

## 2023-03-13 NOTE — Anesthesia Procedure Notes (Signed)
Procedure Name: Intubation Date/Time: 03/13/2023 8:41 AM  Performed by: Maryla Morrow., CRNAPre-anesthesia Checklist: Patient identified, Patient being monitored, Timeout performed, Emergency Drugs available and Suction available Patient Re-evaluated:Patient Re-evaluated prior to induction Oxygen Delivery Method: Circle system utilized Preoxygenation: Pre-oxygenation with 100% oxygen Induction Type: IV induction Ventilation: Mask ventilation without difficulty Laryngoscope Size: 3 and McGraph Grade View: Grade I Tube type: Oral Tube size: 7.5 mm Number of attempts: 1 Airway Equipment and Method: Stylet Placement Confirmation: ETT inserted through vocal cords under direct vision, positive ETCO2 and breath sounds checked- equal and bilateral Secured at: 22 cm Tube secured with: Tape Dental Injury: Teeth and Oropharynx as per pre-operative assessment

## 2023-03-13 NOTE — Transfer of Care (Signed)
Immediate Anesthesia Transfer of Care Note  Patient: Ruben Duffy  Procedure(s) Performed: XI ROBOTIC ASSISTED INGUINAL HERNIA, recurrent (Left)  Patient Location: PACU  Anesthesia Type:General  Level of Consciousness: drowsy  Airway & Oxygen Therapy: Patient Spontanous Breathing  Post-op Assessment: Report given to RN and Post -op Vital signs reviewed and stable  Post vital signs: stable  Last Vitals:  Vitals Value Taken Time  BP 136/68 03/13/23 1140  Temp    Pulse 71 03/13/23 1143  Resp 20 03/13/23 1143  SpO2 89 % 03/13/23 1143  Vitals shown include unvalidated device data.  Last Pain:  Vitals:   03/13/23 0615  TempSrc: Tympanic  PainSc: 0-No pain         Complications: No notable events documented.

## 2023-03-13 NOTE — Discharge Instructions (Addendum)
Laparoscopic Inguinal Hernia Repair, Adult, Care After The following information offers guidance on how to care for yourself after your procedure. Your health care provider may also give you more specific instructions. If you have problems or questions, contact your health care provider. What can I expect after the procedure? After the procedure, it is common to have: Pain. Swelling and bruising around the incision area. Scrotal swelling, in males. Some fluid or blood draining from your incisions. Follow these instructions at home: Medicines Take over-the-counter and prescription medicines only as told by your health care provider. Ask your health care provider if the medicine prescribed to you: Requires you to avoid driving or using machinery. Can cause constipation. You may need to take these actions to prevent or treat constipation: Drink enough fluid to keep your urine pale yellow. Take over-the-counter or prescription medicines. Eat foods that are high in fiber, such as beans, whole grains, and fresh fruits and vegetables. Limit foods that are high in fat and processed sugars, such as fried or sweet foods. Incision care  Follow instructions from your health care provider about how to take care of your incisions. Make sure you: Wash your hands with soap and water for at least 20 seconds before and after you change your bandage (dressing). If soap and water are not available, use hand sanitizer. Change your dressing as told by your health care provider. Leave stitches (sutures), skin glue, or adhesive strips in place. These skin closures may need to stay in place for 2 weeks or longer. If adhesive strip edges start to loosen and curl up, you may trim the loose edges. Do not remove adhesive strips completely unless your health care provider tells you to do that. Check your incision area every day for signs of infection. Check for: More redness, swelling, or pain. More fluid or  blood. Warmth. Pus or a bad smell. Wear loose, soft clothing while your incisions heal. Managing pain and swelling  If directed, put ice on the painful or swollen areas. To do this: Put ice in a plastic bag. Place a towel between your skin and the bag. Leave the ice on for 20 minutes, 2-3 times a day. Remove the ice if your skin turns bright red. This is very important. If you cannot feel pain, heat, or cold, you have a greater risk of damage to the area.   Activity Do not lift anything that is heavier than 10 lb (4.5 kg), or the limit that you are told, until your health care provider says that it is safe. Ask your health care provider what activities are safe for you. A lot of activity during the first week after surgery can increase pain and swelling. For 1 week after your procedure: Avoid activities that take a lot of effort, such as exercise or sports. You may walk and climb stairs as needed for daily activity, but avoid long walks or climbing stairs for exercise. General instructions If you were given a sedative during the procedure, it can affect you for several hours. Do not drive or operate machinery until your health care provider says that it is safe. Do not take baths, swim, or use a hot tub until your health care provider approves. Ask your health care provider if you may take showers. You may only be allowed to take sponge baths. Do not use any products that contain nicotine or tobacco. These products include cigarettes, chewing tobacco, and vaping devices, such as e-cigarettes. If you need help quitting, ask  your health care provider. Keep all follow-up visits. This is important. Contact a health care provider if: You have any of these signs of infection: More redness, swelling, or pain around your incisions or your groin area. More fluid or blood coming from an incision. Warmth coming from an incision. Pus or a bad smell coming from an incision. A fever or chills. You  have more swelling in your scrotum, if you are male. You have severe pain and medicines do not help. You have abdominal pain or swelling. You cannot urinate or have a bowel movement. You faint or feel dizzy. You have nausea and vomiting. Get help right away if: You have redness, warmth, or pain in your leg. You have chest pain. You have problems breathing. These symptoms may represent a serious problem that is an emergency. Do not wait to see if the symptoms will go away. Get medical help right away. Call your local emergency services (911 in the [place] Do not drive yourself to the hospital. Summary Pain, swelling, and bruising are common after the procedure. Check your incision area every day for signs of infection, such as more redness, swelling, or pain. Put ice on painful or swollen areas for 20 minutes, 2-3 times a day. This information is not intended to replace advice given to you by your health care provider. Make sure you discuss any questions you have with your health care provider. Document Revised: 05/30/2020 Document Reviewed: 05/30/2020 Elsevier Patient Education  2024 [employer]  AMBULATORY SURGERY  DISCHARGE INSTRUCTIONS   The drugs that you were given will stay in your system until tomorrow so for the next 24 hours you should not:  Drive an automobile Make any legal decisions Drink any alcoholic beverage   You may resume regular meals tomorrow.  Today it is better to start with liquids and gradually work up to solid foods.  You may eat anything you prefer, but it is better to start with liquids, then soup and crackers, and gradually work up to solid foods.   Please notify your doctor immediately if you have any unusual bleeding, trouble breathing, redness and pain at the surgery site, drainage, fever, or pain not relieved by medication.    Additional Instructions:        Please contact your physician with any problems or Same Day Surgery at  [phone], Monday through Friday 6 am to 4 pm, or [facility] number at [phone].

## 2023-03-13 NOTE — Op Note (Signed)
Robotic assisted Laparoscopic Transabdominal Left Inguinal Hernia Repair recurrent with 3 D X large Mesh Excision of Mesh ( majority of the prior mesh)       Pre-operative Diagnosis:  Recurrent Left Inguinal Hernia   Post-operative Diagnosis: Same   Procedure: Robotic  Laparoscopic  repair of Recurrent Left inguinal hernia   Surgeon: Sterling Big, MD FACS   Anesthesia: Gen. with endotracheal tube   Findings: Left Indirect inguinal hernia with recurrence on the lateral and inferior portion of the mesh Very complex case due to the mesh being folded on inferior portion and distortion of inguinal anatomy Mesh had to be excised to restore decent definable anatomy      Prior ventral defect with well incorporated mesh   Procedure Details  The patient was seen again in the Holding Room. The benefits, complications, treatment options, and expected outcomes were discussed with the patient. The risks of bleeding, infection, recurrence of symptoms, failure to resolve symptoms, recurrence of hernia, ischemic orchitis, chronic pain syndrome or neuroma, were discussed again. The likelihood of improving the patient's symptoms with return to their baseline status is good.  The patient and/or family concurred with the proposed plan, giving informed consent.  The patient was taken to Operating Room, identified  and the procedure verified as Laparoscopic Inguinal Hernia Repair. Laterality confirmed.  A Time Out was held and the above information confirmed.   Prior to the induction of general anesthesia, antibiotic prophylaxis was administered. VTE prophylaxis was in place. General endotracheal anesthesia was then administered and tolerated well. After the induction, the abdomen was prepped with Chloraprep and draped in the sterile fashion. The patient was positioned in the supine position.     Supraumbilical incision created and cut down technique used to enter the abdominal cavity. Fascia elevated and  incised and hasson trochar placed. Pneumoperitoneum obtained w/o HD changes. No evidence of bowel injuries.  Two 8 mm placed under direct vision. The laparoscopy revealed large indirect defects. I inserted the needles and the mesh. The robot was brought ot the table and docked in the standard fashion, no collision between arms was observed. Instruments were kept under direct view at all times. There was some omentum adhere to the abdominal wall, this was taken down with scissors. No injuries seen and mesh for ventral repair was well incorporated   Attention then was turned to the left inguinal side were a flap was created. The sac was reduced and dissected free from adjacent structures. The recurrence was on the lateral and inferior side of the mesh. The mesh was deeply adhere to the inguinal structures and we took at least 90 minutes with meticulous dissection  until we were able to restore the anatomy. Given that the prior mesh was folded , we needed to resect the majority of the prior mesh. We continued our dissection, We preserved the vas and the vessels. Once dissection was completed an Xl arge 3D mesh was placed and secured with two interrupted vicryl attached to the pubic tubercle. There was good coverage of the direct, indirect and femoral spaces. The flap was closed with v lock suture. Second look revealed no complications or injuries. Old mesh was removed and sent to pathology.    Once assuring that hemostasis was adequate the ports were removed and a figure-of-eight 0 Vicryl suture was placed at the fascial edges. 4-0 subcuticular Monocryl was used at all skin edges. Dermabond was placed.  Patient tolerated the procedure well. There were no complications. He was taken  to the recovery room in stable condition.                 Sterling Big, MD, FACS

## 2023-03-13 NOTE — Anesthesia Postprocedure Evaluation (Signed)
Anesthesia Post Note  Patient: Torben A Prioleau  Procedure(s) Performed: XI ROBOTIC ASSISTED INGUINAL HERNIA, recurrent (Left)  Patient location during evaluation: PACU Anesthesia Type: General Level of consciousness: awake and alert Pain management: pain level controlled Vital Signs Assessment: post-procedure vital signs reviewed and stable Respiratory status: spontaneous breathing, nonlabored ventilation, respiratory function stable and patient connected to nasal cannula oxygen Cardiovascular status: blood pressure returned to baseline and stable Postop Assessment: no apparent nausea or vomiting Anesthetic complications: no   There were no known notable events for this encounter.   Last Vitals:  Vitals:   03/13/23 1230 03/13/23 1244  BP: (!) 141/80 (!) 145/83  Pulse: 66 (!) 58  Resp: 15 16  Temp: 36.8 C (!) 36.1 C  SpO2: 96% 100%    Last Pain:  Vitals:   03/13/23 1244  TempSrc: Temporal  PainSc: 0-No pain                 Cleda Mccreedy Avelina Mcclurkin

## 2023-03-13 NOTE — Anesthesia Preprocedure Evaluation (Signed)
Anesthesia Evaluation  Patient identified by MRN, date of birth, ID band Patient awake    Reviewed: Allergy & Precautions, NPO status , Patient's Chart, lab work & pertinent test results  History of Anesthesia Complications Negative for: history of anesthetic complications  Airway Mallampati: III  TM Distance: >3 FB Neck ROM: full    Dental  (+) Chipped, Poor Dentition   Pulmonary COPD, former smoker   Pulmonary exam normal        Cardiovascular Exercise Tolerance: Good hypertension, + CAD and +CHF  Normal cardiovascular exam+ dysrhythmias + pacemaker + Cardiac Defibrillator      Neuro/Psych negative neurological ROS  negative psych ROS   GI/Hepatic Neg liver ROS,GERD  Controlled,,  Endo/Other  negative endocrine ROS    Renal/GU      Musculoskeletal   Abdominal   Peds  Hematology negative hematology ROS (+)   Anesthesia Other Findings Past Medical History: 04/08/2022: AICD (automatic cardioverter/defibrillator) present     Comment:  a.) [employer]ura CRT-D No date: Arthritis No date: Bilateral inguinal hernia     Comment:  a.) s/p BILATERAL repair 11/19/2022; b.) recurrent               hernia on LEFT 09/10/2021: CAD (coronary artery disease)     Comment:  a.) R/LHC 09/10/2021: 30% pLCx - med mgmt. No date: Cardiomegaly No date: CHF (congestive heart failure) (HCC)     Comment:  a.) TTE 09/07/2021: EF 25-30%, diff inf HK, mod LV dil,               mod LAE, mod-sev MR; b.) R/LHC 09/10/2021: EF 25%, mPA               13, PCWP 5, CO 6.2, CI 3.1; c.) cMRI 09/11/2021: EF 14%,               sev LV dil, diff HK; d.) TTE 01/11/2022: EF 25-30%, glob               HK, mod LV dil, mod LAE, mild MR, G2DD; e.) CRT-D               implanted 04/08/2022; f.) TTE 08/08/2022: EF 30-35%, glob              HK, triv MR, G1DD No date: DDD (degenerative disc disease), lumbar No date: Emphysema of lung (HCC) No date:  Erectile dysfunction     Comment:  a.) on PDE5i (sildenafil) PRN No date: GERD (gastroesophageal reflux disease) No date: Hemorrhoids No date: Hepatitis C virus infection cured after antiviral drug  therapy No date: History of substance abuse (HCC)     Comment:  a.) THC + crack cocaine No date: HLD (hyperlipidemia) No date: Hypertension No date: Hypospadias, balanic 04/26/2021: LBBB (left bundle branch block)     Comment:  a.) noted on preop ECG 04/26/2021 No date: Long term current use of amiodarone No date: Lower urinary tract symptoms (LUTS) No date: NICM (nonischemic cardiomyopathy) (HCC)     Comment:  a.) TTE 09/07/2021: EF 25-30%; b.) R/LHC 09/10/2021: mPA              13, PCWP 5, CO 6.2, CI 3.1; c.) cMRI 09/11/2021: EF 14%,               diff HK, RV insertion site LGE; d.) TTE 01/11/2022: EF               25-30% e.) CRT-D implanted 04/08/2022; f.)  TTE               08/08/2022: EF 30-35% No date: Pre-diabetes 01/08/2021: Prostate cancer [facility])     Comment:  a.) Gleason 3+4, T2a; b.) s/p brachytherapy 06/05/2021 No date: Umbilical hernia     Comment:  a.) s/p repair 11/19/2022  Past Surgical History: No date: BIOPSY PROSTATE 04/08/2022: BIV ICD INSERTION CRT-D; N/A     Comment:  Procedure: BIV ICD INSERTION CRT-D;  Surgeon: Duke Salvia, MD;  Location: [facility] INVASIVE CV LAB;  Service:               Cardiovascular;  Laterality: N/A; 06/05/2021: CIRCUMCISION; N/A     Comment:  Procedure: CIRCUMCISION ADULT;  Surgeon: Bjorn Pippin,               MD;  Location: [facility];  Service:               Urology;  Laterality: N/A; No date: COLONOSCOPY W/ POLYPECTOMY No date: HEMORRHOID SURGERY No date: HYPOSPADIAS CORRECTION 11/19/2022: INSERTION OF MESH     Comment:  Procedure: INSERTION OF MESH;  Surgeon: Leafy Ro,               MD;  Location: [facility] ORS;  Service: General;; 06/05/2021: RADIOACTIVE SEED IMPLANT; N/A     Comment:  Procedure:  RADIOACTIVE SEED IMPLANT/BRACHYTHERAPY               IMPLANT;  Surgeon: Bjorn Pippin, MD;  Location: [facility]               [place];  Service: Urology;  Laterality: N/A; 09/10/2021: RIGHT/LEFT HEART CATH AND CORONARY ANGIOGRAPHY; N/A     Comment:  Procedure: RIGHT/LEFT HEART CATH AND CORONARY               ANGIOGRAPHY;  Surgeon: Dolores Patty, MD;                Location: [facility] INVASIVE CV LAB;  Service: Cardiovascular;                Laterality: N/A; 06/05/2021: SPACE OAR INSTILLATION; N/A     Comment:  Procedure: SPACE OAR INSTILLATION;  Surgeon: Bjorn Pippin, MD;  Location: [facility];                Service: Urology;  Laterality: N/A; 11/19/2022: UMBILICAL HERNIA REPAIR; N/A     Comment:  Procedure: HERNIA REPAIR UMBILICAL ADULT;  Surgeon:               Leafy Ro, MD;  Location: [facility] ORS;  Service:               General;  Laterality: N/A;  BMI    Body Mass Index: 26.00 kg/m      Reproductive/Obstetrics negative OB ROS                             Anesthesia Physical Anesthesia Plan  ASA: 4  Anesthesia Plan: General ETT   Post-op Pain Management:    Induction: Intravenous  PONV Risk Score and Plan: Ondansetron, Dexamethasone, Midazolam and Treatment may vary due to age or medical condition  Airway Management Planned: Oral ETT  Additional Equipment:   Intra-op Plan:  Post-operative Plan: Extubation in OR  Informed Consent: I have reviewed the patients History and Physical, chart, labs and discussed the procedure including the risks, benefits and alternatives for the proposed anesthesia with the patient or authorized representative who has indicated his/her understanding and acceptance.     Dental Advisory Given  Plan Discussed with: Anesthesiologist, CRNA and Surgeon  Anesthesia Plan Comments: (Patient consented for risks of anesthesia including but not limited to:  - adverse reactions to  medications - damage to eyes, teeth, lips or other oral mucosa - nerve damage due to positioning  - sore throat or hoarseness - Damage to heart, brain, nerves, lungs, other parts of body or loss of life  Patient voiced understanding.)       Anesthesia Quick Evaluation

## 2023-03-18 LAB — SURGICAL PATHOLOGY

## 2023-03-20 ENCOUNTER — Telehealth: Payer: Self-pay | Admitting: *Deleted

## 2023-03-20 NOTE — Telephone Encounter (Signed)
Faxed FMLA to [employer] [phone]

## 2023-03-24 NOTE — Progress Notes (Signed)
Advanced Heart Failure Clinic Note   Primary Care: Donetta Potts, MD EP: Dr. Graciela Husbands HF Cardiologist: Dr. 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   He was seen by Dr Allyson Sabal 04/2021 for preop eval before radioactive seed implant and circumcision. He had LBBB of unknown duration, BP well-controlled. No further workup pre-procedure.   Admitted 08/2021 for a/c systolic HF. Echo EF 20-25%. LV dilated (6.5cm) with severe central MR. Global HK with ? AK of anteroseptum.  Diuresed with IV Lasix. Initiated on GDMT with digoxin, spironolactone, Entresto and Farxiga. Frequent PVCs, started on amiodarone w/ reduction in PVC burden. R/LHC showed minimal nonobstructive CAD, cMRI showed LVEF 14%, diffuse HK, mild/mod RV dilation w/ severe systolic dysfunction, RVEF 15%, small nonspecific basal inferoseptal RV insertion site LGE, mild elevation in ECV, not suggestive of cardiac amyloidosis. 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 LifeVest placed.   Home sleep study 09/2021 AHI 0.6/hr   Echo 01/11/22: EF 25-30% LV markedly dilated. Severe dyssynchrony. RV normal. Mild to moderate MR. Referred to EP for CRT-D.    Underwent CRT-D implant 03/2022.    Follow up 07/2022, still with NYHA II-III symptoms despite CRT-D, volume ok. Echo repeated, considering CPX if no better. Amiodarone decreased to 100 daily.   Echo (07/2022): EF 30-35%, moderate LV dysfunction with global HK, grade I DD, RV ok   S/p laparoscopic bilateral inguinal hernia repair and open ventral hernia repair (11/2022). Follow up 11/2022, felt like he had more energy after CRT-D. NYHA II and volume OK. CPX arranged which showed mild HF limitation.  Bedside echo 03/07/23, EF 30%   Returned to [facility] for HF follow up on 03/07/23, needed surgical clearance for left hernia repair. 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 Symptoms improved about 6 weeks after stopping meds. More SOB now, dyspnea with walking up hills. Had episode of chest pain while driving last week, resolved spontaneously. Denies palpitations, dizziness, edema, or PND/Orthopnea. Appetite ok. No fever or chills. Weight at home 170 pounds. Has left hernia surgery on 03/13/23 at [facility].  Today he returns to HF clinic for pharmacist medication titration. At last visit with APP, Entresto and carvedilol were restarted. He had been off all GDMT x6 months. Now s/p L hernia repair and doing well. Overall he is feeling well today. Says he is feeling fine, just bored from being out of work. No dizziness, lightheadedness, CP or palpations. Says he used to get SOB in the mornings after waking up, but this has improved after restarting medications. Has not had any grogginess/fogginess after restarting carvedilol and Entresto. No SOB/DOE. Weight at home has been stable at 176-178 lbs. He does not need a loop diuretic. No LEE, PND or orthopnea. Taking all medications as prescribed and tolerating all medications. His rheumatologist started him on methotrexate 10 mg weekly for rheumatoid arthritis.   HF Medications: Carvedilol 3.125 mg BID Entresto 24/26 mg BID  Has the patient been experiencing any side effects to the medications prescribed?  no  Does the patient have any problems obtaining medications due to transportation or finances?   Insurance has changed from [place] to [employer]. Has not gotten insurance card in the mail yet.   Understanding of regimen: good  Understanding of indications: good Potential of compliance: good Patient understands to avoid NSAIDs. Patient understands to avoid decongestants.    Pertinent Lab Values: 03/13/23: Serum creatinine 0.93, BUN 8, Potassium 4.0, Sodium 138, BNP 87.5  Vital Signs: Weight: 179.6 lbs (last  clinic weight: 171 lbs) Blood pressure: 120/76  Heart rate: 75   Assessment/Plan: 1. Chronic Systolic HF - Echo (08/2021): EF 20-25%. LV dilated (6.5cm) with severe central MR. Global HK with ? AK of anteroseptum  - RHC/LHC (08/2021) with minimal obstructive CAD, low filling pressure, normal output and severe NICM - cMRI (08/2021): LVEF 14%, diffuse HK, mild/mod RV dilation w/ severe systolic dysfunction, RVEF 15%, small nonspecific basal inferoseptal RV insertion site LGE, mild elevation in ECV, not suggestive of cardiac amyloidosis. - Echo (12/2021): EF 25-30% LV markedly dilated. Severe dyssynchrony. RV normal. 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  - BMET today pending - Continue carvedilol 3.125 mg BID. - Continue Entresto 24/26 mg BID. - Start Farxiga 10 mg daily - Plan to add spironolactone back next visit.  - Plan formal echo in 3 months, after back on GDMT consistently.    2. Frequent PVCs - Previously on amiodarone, now stopped.  - None on ECG 03/07/23. Appropriate BiV pacing on ICD - Sleep study normal - Placed 2 week Zio today to follow PVC burden off amiodarone, results pending.    3. Severe mitral regurgitation - Functional. - Mild to moderate MR on echo 12/2021. - Now trivial MR on echo 07/2022   4. COPD - Quit smoking 2009. - Stable, no change.   5. Pre-Diabetes - A1C 6.0 - Restart Farxiga 10 mg daily as above   6. H/o prostate CA - s/p radioactive seeds   7. Snoring - Home sleep study (09/2021) AHI 0.6/hr  Follow up 3 weeks with Pharmacy Clinic   Karle Plumber, PharmD, BCPS, BCCP, CPP Heart Failure Clinic Pharmacist [phone]

## 2023-03-27 ENCOUNTER — Other Ambulatory Visit: Payer: 59

## 2023-03-27 DIAGNOSIS — Z8546 Personal history of malignant neoplasm of prostate: Secondary | ICD-10-CM

## 2023-03-28 LAB — PSA: Prostate Specific Ag, Serum: 0.2 ng/mL (ref 0.0–4.0)

## 2023-03-31 ENCOUNTER — Encounter: Payer: Self-pay | Admitting: Surgery

## 2023-03-31 ENCOUNTER — Ambulatory Visit (INDEPENDENT_AMBULATORY_CARE_PROVIDER_SITE_OTHER): Payer: 59 | Admitting: Surgery

## 2023-03-31 VITALS — BP 142/84 | HR 72 | Temp 98.3°F | Ht 68.0 in | Wt 176.4 lb

## 2023-03-31 DIAGNOSIS — Z09 Encounter for follow-up examination after completed treatment for conditions other than malignant neoplasm: Secondary | ICD-10-CM

## 2023-03-31 DIAGNOSIS — K4091 Unilateral inguinal hernia, without obstruction or gangrene, recurrent: Secondary | ICD-10-CM

## 2023-03-31 NOTE — Progress Notes (Signed)
Ruben Duffy is a 57 year old male 2 and half weeks following recurrent repair of left inguinal hernia.  He has done well.  He points small area where he is tender.  No fevers no chills taking p.o.  Recently taking care of her mother that had knee surgery.  PE NAD Abd: Soft incision healing well without infection.  There is no evidence of recurrent hernias.  There is no evidence of abscess or seromas.  A/P well after recurrent repair of left inguinal hernia.  No evidence of complications. Return to work after full 8 weeks. ( Due to lifting restrictions) RTC prn

## 2023-03-31 NOTE — Patient Instructions (Signed)
If you have any concerns or questions, please feel free to call our office. Follow up as needed.    GENERAL POST-OPERATIVE PATIENT INSTRUCTIONS   WOUND CARE INSTRUCTIONS:  Keep a dry clean dressing on the wound if there is drainage. The initial bandage may be removed after 24 hours.  Once the wound has quit draining you may leave it open to air.  If clothing rubs against the wound or causes irritation and the wound is not draining you may cover it with a dry dressing during the daytime.  Try to keep the wound dry and avoid ointments on the wound unless directed to do so.  If the wound becomes bright red and painful or starts to drain infected material that is not clear, please contact your physician immediately.  If the wound is mildly pink and has a thick firm ridge underneath it, this is normal, and is referred to as a healing ridge.  This will resolve over the next 4-6 weeks.  BATHING: You may shower if you have been informed of this by your surgeon. However, Please do not submerge in a tub, hot tub, or pool until incisions are completely sealed or have been told by your surgeon that you may do so.  DIET:  You may eat any foods that you can tolerate.  It is a good idea to eat a high fiber diet and take in plenty of fluids to prevent constipation.  If you do become constipated you may want to take a mild laxative or take ducolax tablets on a daily basis until your bowel habits are regular.  Constipation can be very uncomfortable, along with straining, after recent surgery.  ACTIVITY:  You are encouraged to cough and deep breath or use your incentive spirometer if you were given one, every 15-30 minutes when awake.  This will help prevent respiratory complications and low grade fevers post-operatively if you had a general anesthetic.  You may want to hug a pillow when coughing and sneezing to add additional support to the surgical area, if you had abdominal or chest surgery, which will decrease pain  during these times.  You are encouraged to walk and engage in light activity for the next two weeks.  You should not lift more than 20 pounds for 6 weeks total after surgery as it could put you at increased risk for complications.  Twenty pounds is roughly equivalent to a plastic bag of groceries. At that time- Listen to your body when lifting, if you have pain when lifting, stop and then try again in a few days. Soreness after doing exercises or activities of daily living is normal as you get back in to your normal routine.  MEDICATIONS:  Try to take narcotic medications and anti-inflammatory medications, such as tylenol, ibuprofen, naprosyn, etc., with food.  This will minimize stomach upset from the medication.  Should you develop nausea and vomiting from the pain medication, or develop a rash, please discontinue the medication and contact your physician.  You should not drive, make important decisions, or operate machinery when taking narcotic pain medication.  SUNBLOCK Use sun block to incision area over the next year if this area will be exposed to sun. This helps decrease scarring and will allow you avoid a permanent darkened area over your incision.  QUESTIONS:  Please feel free to call our office if you have any questions, and we will be glad to assist you. [phone]   

## 2023-04-02 ENCOUNTER — Other Ambulatory Visit (HOSPITAL_COMMUNITY): Payer: Self-pay

## 2023-04-02 ENCOUNTER — Inpatient Hospital Stay (HOSPITAL_COMMUNITY)
Admission: RE | Admit: 2023-04-02 | Discharge: 2023-04-02 | Disposition: A | Discharge status: Home / Self Care | Payer: 59 | Source: Ambulatory Visit | Attending: Internal Medicine | Admitting: Internal Medicine

## 2023-04-02 ENCOUNTER — Ambulatory Visit (HOSPITAL_COMMUNITY)
Admission: RE | Admit: 2023-04-02 | Discharge: 2023-04-02 | Disposition: A | Discharge status: Home / Self Care | Payer: Commercial Managed Care - HMO | Source: Ambulatory Visit | Attending: Internal Medicine | Admitting: Internal Medicine

## 2023-04-02 VITALS — BP 120/76 | HR 75 | Wt 179.6 lb

## 2023-04-02 DIAGNOSIS — J439 Emphysema, unspecified: Secondary | ICD-10-CM | POA: Diagnosis not present

## 2023-04-02 DIAGNOSIS — Z95810 Presence of automatic (implantable) cardiac defibrillator: Secondary | ICD-10-CM | POA: Diagnosis not present

## 2023-04-02 DIAGNOSIS — Z79899 Other long term (current) drug therapy: Secondary | ICD-10-CM | POA: Insufficient documentation

## 2023-04-02 DIAGNOSIS — R7303 Prediabetes: Secondary | ICD-10-CM | POA: Diagnosis not present

## 2023-04-02 DIAGNOSIS — I110 Hypertensive heart disease with heart failure: Secondary | ICD-10-CM | POA: Diagnosis not present

## 2023-04-02 DIAGNOSIS — I493 Ventricular premature depolarization: Secondary | ICD-10-CM

## 2023-04-02 DIAGNOSIS — Z8546 Personal history of malignant neoplasm of prostate: Secondary | ICD-10-CM | POA: Diagnosis not present

## 2023-04-02 DIAGNOSIS — I340 Nonrheumatic mitral (valve) insufficiency: Secondary | ICD-10-CM | POA: Diagnosis not present

## 2023-04-02 DIAGNOSIS — I5022 Chronic systolic (congestive) heart failure: Secondary | ICD-10-CM | POA: Insufficient documentation

## 2023-04-02 DIAGNOSIS — Z87891 Personal history of nicotine dependence: Secondary | ICD-10-CM | POA: Insufficient documentation

## 2023-04-02 DIAGNOSIS — K219 Gastro-esophageal reflux disease without esophagitis: Secondary | ICD-10-CM | POA: Insufficient documentation

## 2023-04-02 DIAGNOSIS — R0683 Snoring: Secondary | ICD-10-CM | POA: Insufficient documentation

## 2023-04-02 DIAGNOSIS — I428 Other cardiomyopathies: Secondary | ICD-10-CM | POA: Diagnosis not present

## 2023-04-02 DIAGNOSIS — I447 Left bundle-branch block, unspecified: Secondary | ICD-10-CM | POA: Insufficient documentation

## 2023-04-02 LAB — BASIC METABOLIC PANEL
Anion gap: 8 (ref 5–15)
BUN: 6 mg/dL (ref 6–20)
CO2: 27 mmol/L (ref 22–32)
Calcium: 9 mg/dL (ref 8.9–10.3)
Chloride: 103 mmol/L (ref 98–111)
Creatinine, Ser: 0.81 mg/dL (ref 0.61–1.24)
GFR, Estimated: >60 mL/min (ref >60)
Glucose, Bld: 94 mg/dL (ref 70–99)
Potassium: 4.6 mmol/L (ref 3.5–5.1)
Sodium: 138 mmol/L (ref 135–145)

## 2023-04-02 MED ORDER — FARXIGA 10 MG PO TABS: 10.0000 mg | ORAL_TABLET | Freq: Every day | ORAL | 11 refills | Status: DC

## 2023-04-02 NOTE — Addendum Note (Signed)
Encounter addended by: Dolores Patty, MD on: 04/02/2023 6:51 PM  Actions taken: Clinical Note Signed, Level of Service modified, Visit diagnoses modified

## 2023-04-02 NOTE — Patient Instructions (Signed)
It was a pleasure seeing you today!  MEDICATIONS: -We are changing your medications today -Start Farxiga 10 mg (1 tablet) daily -Call if you have questions about your medications.  LABS: -We will call you if your labs need attention.  NEXT APPOINTMENT: Return to clinic in 3 weeks with Pharmacy Clinic.  In general, to take care of your heart failure: -Limit your fluid intake to 2 Liters (half-gallon) per day.   -Limit your salt intake to ideally 2-3 grams (2000-3000 mg) per day. -Weigh yourself daily and record, and bring that "weight diary" to your next appointment.  (Weight gain of 2-3 pounds in 1 day typically means fluid weight.) -The medications for your heart are to help your heart and help you live longer.   -Please contact us before stopping any of your heart medications.  Call the clinic at [phone] with questions or to reschedule future appointments.

## 2023-04-03 ENCOUNTER — Ambulatory Visit: Payer: 59 | Admitting: Urology

## 2023-04-07 NOTE — Progress Notes (Signed)
Advanced Heart Failure Clinic Note   Primary Care: Donetta Potts, MD EP: Dr. Graciela Husbands HF Cardiologist: Dr. 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   He was seen by Dr Allyson Sabal 04/2021 for preop eval before radioactive seed implant and circumcision. He had LBBB of unknown duration, BP well-controlled. No further workup pre-procedure.   Admitted 08/2021 for a/c systolic HF. Echo EF 20-25%. LV dilated (6.5cm) with severe central MR. Global HK with ? AK of anteroseptum.  Diuresed with IV Lasix. Initiated on GDMT with digoxin, spironolactone, Entresto and Farxiga. Frequent PVCs, started on amiodarone w/ reduction in PVC burden. R/LHC showed minimal nonobstructive CAD, cMRI showed LVEF 14%, diffuse HK, mild/mod RV dilation w/ severe systolic dysfunction, RVEF 15%, small nonspecific basal inferoseptal RV insertion site LGE, mild elevation in ECV, not suggestive of cardiac amyloidosis. 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 LifeVest placed.   Home sleep study 09/2021 AHI 0.6/hr   Echo 01/11/22: EF 25-30% LV markedly dilated. Severe dyssynchrony. RV normal. Mild to moderate MR. Referred to EP for CRT-D.    Underwent CRT-D implant 03/2022.    Follow up 07/2022, still with NYHA II-III symptoms despite CRT-D, volume ok. Echo repeated, considering CPX if no better. Amiodarone decreased to 100 daily.   Echo (07/2022): EF 30-35%, moderate LV dysfunction with global HK, grade I DD, RV ok   S/p laparoscopic bilateral inguinal hernia repair and open ventral hernia repair (11/2022). Follow up 11/2022, felt like he had more energy after CRT-D. NYHA II and volume OK. CPX arranged which showed mild HF limitation.  Bedside echo 03/07/23, EF 30%   Returned to [facility] for HF follow up on 03/07/23, needed surgical clearance for left hernia repair. 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 Symptoms improved about 6 weeks after stopping meds. More SOB now, dyspnea with walking up hills. Had episode of chest pain while driving last week, resolved spontaneously. Denies palpitations, dizziness, edema, or PND/Orthopnea. Appetite ok. No fever or chills. Weight at home 170 pounds. Has left hernia surgery on 03/13/23 at [facility].  Presented to HF clinic for pharmacist medication titration 04/02/23. Was now s/p L hernia repair and doing well. Overall he was feeling well. Stated he was feeling fine, just bored from being out of work. No dizziness, lightheadedness, CP or palpations. Stated he used to get SOB in the mornings after waking up, but this had improved after restarting medications. Had not had any grogginess/fogginess after restarting carvedilol and Entresto. No SOB/DOE. Weight at home had been stable at 176-178 lbs. He does not need a loop diuretic. No LEE, PND or orthopnea. Reported taking all medications as prescribed and tolerating all medications. His rheumatologist started him on methotrexate 10 mg weekly for rheumatoid arthritis.   Today he returns to HF clinic for pharmacist medication titration. At recent visits to clinic, Entresto, carvedilol and Marcelline Deist were restarted. Two week Zio patch was placed. Overall he is feeling well today. No dizziness or lightheadedness. No CP or palpitations. Does get SOB in the morning, which he attributes to PVCs. These episodes do not last long. No SOB/DOE. Has been taking his time walking as he is still healing from his hernia repair. Weight at home has been stable. No LEE, PND or  orthopnea. Taking all medications as prescribed and tolerating all medications. Asking about restarting a statin today (PCP recommended).    HF Medications: Carvedilol 3.125 mg BID Entresto 24/26 mg BID Farxiga 10 mg daily  Has the patient been experiencing any side effects to  the medications prescribed?  no  Does the patient have any problems obtaining medications due to transportation or finances?    Chartered loss adjuster.   Understanding of regimen: good Understanding of indications: good Potential of compliance: good Patient understands to avoid NSAIDs. Patient understands to avoid decongestants.    Pertinent Lab Values: 04/02/23: Serum creatinine 0.81, BUN 6, Potassium 4.6, Sodium 138, BNP 87.5  Vital Signs:  Weight: 175.4 lbs (last clinic weight: 179.6 lbs) Blood pressure: 118/68  Heart rate: 77   Assessment/Plan: 1. Chronic Systolic HF - Echo (08/2021): EF 20-25%. LV dilated (6.5cm) with severe central MR. Global HK with ? AK of anteroseptum  - RHC/LHC (08/2021) with minimal obstructive CAD, low filling pressure, normal output and severe NICM - cMRI (08/2021): LVEF 14%, diffuse HK, mild/mod RV dilation w/ severe systolic dysfunction, RVEF 15%, small nonspecific basal inferoseptal RV insertion site LGE, mild elevation in ECV, not suggestive of cardiac amyloidosis. - Echo (12/2021): EF 25-30% LV markedly dilated. Severe dyssynchrony. RV normal. 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  - Continue carvedilol 3.125 mg BID. - Continue Entresto 24/26 mg BID. - Start spironolactone 25 mg daily. Repeat BMET in 1 week. Will go to [facility].  - Continue Farxiga 10 mg daily - Plan formal echo in 3 months, after back on GDMT consistently.    2. Frequent PVCs - Previously on amiodarone, now stopped.  - None on ECG 03/07/23. Appropriate BiV pacing on ICD - Sleep study normal - Placed 2 week Zio today to follow PVC burden off amiodarone on 04/02/23, results pending.   3. Severe mitral regurgitation - Functional. - Mild to moderate MR on echo 12/2021. - Now trivial  MR on echo 07/2022   4. COPD - Quit smoking 2009. - Stable, no change.   5. Pre-Diabetes - A1C 6.0 - Continue Farxiga    6. H/o prostate CA - s/p radioactive seeds   7. Snoring - Home sleep study (09/2021) AHI 0.6/hr  8. CAD - minimal non-obstructive CAD on [facility] 09/10/21 - Start rosuvastatin 10 mg daily. Monitor carefully for side effects regarding concentration and memory impairment. Atorvastatin was stopped before for possibly contributing to these symptoms. Patient willing to try a different statin at low dose.    Follow up 3 weeks with Pharmacy Clinic.   Karle Plumber, PharmD, BCPS, BCCP, CPP Heart Failure Clinic Pharmacist [phone]

## 2023-04-08 ENCOUNTER — Ambulatory Visit (INDEPENDENT_AMBULATORY_CARE_PROVIDER_SITE_OTHER): Payer: Commercial Managed Care - HMO

## 2023-04-08 DIAGNOSIS — I428 Other cardiomyopathies: Secondary | ICD-10-CM

## 2023-04-09 LAB — CUP PACEART REMOTE DEVICE CHECK
Battery Remaining Longevity: 80 mo
Battery Remaining Percentage: 85 %
Battery Voltage: 3.04 V
Brady Statistic AP VP Percent: 1 %
Brady Statistic AP VS Percent: 1 %
Brady Statistic AS VP Percent: 96 %
Brady Statistic AS VS Percent: 1.9 %
Brady Statistic RA Percent Paced: 1 %
Date Time Interrogation Session: 20240625040018
HighPow Impedance: 60 Ohm
HighPow Impedance: 60 Ohm
Implantable Lead Connection Status: 753985
Implantable Lead Connection Status: 753985
Implantable Lead Connection Status: 753985
Implantable Lead Implant Date: 20230626
Implantable Lead Implant Date: 20230626
Implantable Lead Implant Date: 20230626
Implantable Lead Location: 753858
Implantable Lead Location: 753859
Implantable Lead Location: 753860
Implantable Lead Model: 7122
Implantable Pulse Generator Implant Date: 20230626
Lead Channel Impedance Value: 480 Ohm
Lead Channel Impedance Value: 530 Ohm
Lead Channel Impedance Value: 640 Ohm
Lead Channel Pacing Threshold Amplitude: 0.625 V
Lead Channel Pacing Threshold Amplitude: 0.75 V
Lead Channel Pacing Threshold Amplitude: 1.375 V
Lead Channel Pacing Threshold Pulse Width: 0.5 ms
Lead Channel Pacing Threshold Pulse Width: 0.5 ms
Lead Channel Pacing Threshold Pulse Width: 0.5 ms
Lead Channel Sensing Intrinsic Amplitude: 11.6 mV
Lead Channel Sensing Intrinsic Amplitude: 2.2 mV
Lead Channel Setting Pacing Amplitude: 1.75 V
Lead Channel Setting Pacing Amplitude: 2 V
Lead Channel Setting Pacing Amplitude: 2 V
Lead Channel Setting Pacing Pulse Width: 0.5 ms
Lead Channel Setting Pacing Pulse Width: 0.5 ms
Lead Channel Setting Sensing Sensitivity: 0.5 mV
Pulse Gen Serial Number: 8949760

## 2023-04-23 ENCOUNTER — Ambulatory Visit (HOSPITAL_COMMUNITY)
Admission: RE | Admit: 2023-04-23 | Discharge: 2023-04-23 | Disposition: A | Discharge status: Home / Self Care | Payer: Commercial Managed Care - HMO | Source: Ambulatory Visit | Attending: Internal Medicine | Admitting: Internal Medicine

## 2023-04-23 ENCOUNTER — Other Ambulatory Visit (HOSPITAL_COMMUNITY): Payer: Self-pay

## 2023-04-23 VITALS — BP 118/68 | HR 77 | Wt 175.4 lb

## 2023-04-23 DIAGNOSIS — Z8546 Personal history of malignant neoplasm of prostate: Secondary | ICD-10-CM | POA: Insufficient documentation

## 2023-04-23 DIAGNOSIS — Z87891 Personal history of nicotine dependence: Secondary | ICD-10-CM | POA: Diagnosis not present

## 2023-04-23 DIAGNOSIS — J449 Chronic obstructive pulmonary disease, unspecified: Secondary | ICD-10-CM | POA: Insufficient documentation

## 2023-04-23 DIAGNOSIS — I5022 Chronic systolic (congestive) heart failure: Secondary | ICD-10-CM | POA: Diagnosis present

## 2023-04-23 DIAGNOSIS — I428 Other cardiomyopathies: Secondary | ICD-10-CM | POA: Insufficient documentation

## 2023-04-23 DIAGNOSIS — I340 Nonrheumatic mitral (valve) insufficiency: Secondary | ICD-10-CM | POA: Diagnosis not present

## 2023-04-23 DIAGNOSIS — I2510 Atherosclerotic heart disease of native coronary artery without angina pectoris: Secondary | ICD-10-CM | POA: Insufficient documentation

## 2023-04-23 DIAGNOSIS — I110 Hypertensive heart disease with heart failure: Secondary | ICD-10-CM | POA: Insufficient documentation

## 2023-04-23 DIAGNOSIS — R7303 Prediabetes: Secondary | ICD-10-CM | POA: Insufficient documentation

## 2023-04-23 DIAGNOSIS — R0683 Snoring: Secondary | ICD-10-CM | POA: Diagnosis not present

## 2023-04-23 MED ORDER — ROSUVASTATIN CALCIUM 10 MG PO TABS: 10.0000 mg | ORAL_TABLET | Freq: Every day | ORAL | 3 refills | Status: DC

## 2023-04-23 MED ORDER — ATORVASTATIN CALCIUM 40 MG PO TABS: 40.0000 mg | ORAL_TABLET | Freq: Every day | ORAL | 3 refills | Status: DC

## 2023-04-23 MED ORDER — ENTRESTO 24-26 MG PO TABS: 1.0000 | ORAL_TABLET | Freq: Two times a day (BID) | ORAL | 3 refills | Status: DC

## 2023-04-23 MED ORDER — SPIRONOLACTONE 25 MG PO TABS: 25.0000 mg | ORAL_TABLET | Freq: Every day | ORAL | 3 refills | Status: DC

## 2023-04-23 NOTE — Patient Instructions (Addendum)
It was a pleasure seeing you today!  MEDICATIONS: -We are changing your medications today -Start spironolactone 25 mg (1 tablet daily) -Start rosuvastatin 10 mg (1 tablet) daily -Call if you have questions about your medications.  LABS: -We will call you if your labs need attention. Please go to [facility] and get them drawn in 1 week.  NEXT APPOINTMENT: Return to clinic in 3 weeks with Pharmacy Clinic.  In general, to take care of your heart failure: -Limit your fluid intake to 2 Liters (half-gallon) per day.   -Limit your salt intake to ideally 2-3 grams (2000-3000 mg) per day. -Weigh yourself daily and record, and bring that "weight diary" to your next appointment.  (Weight gain of 2-3 pounds in 1 day typically means fluid weight.) -The medications for your heart are to help your heart and help you live longer.   -Please contact us before stopping any of your heart medications.  Call the clinic at [phone] with questions or to reschedule future appointments.

## 2023-04-28 NOTE — Progress Notes (Signed)
Advanced Heart Failure Clinic Note   Primary Care: Ruben Potts, MD EP: Dr. Graciela Duffy HF Cardiologist: Dr. 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   He was seen by Dr Ruben Duffy 04/2021 for preop eval before radioactive seed implant and circumcision. He had LBBB of unknown duration, BP well-controlled. No further workup pre-procedure.   Admitted 08/2021 for a/c systolic HF. Echo EF 20-25%. LV dilated (6.5cm) with severe central MR. Global HK with ? AK of anteroseptum.  Diuresed with IV Lasix. Initiated on GDMT with digoxin, spironolactone, Entresto and Farxiga. Frequent PVCs, started on amiodarone w/ reduction in PVC burden. R/LHC showed minimal nonobstructive CAD, cMRI showed LVEF 14%, diffuse HK, mild/mod RV dilation w/ severe systolic dysfunction, RVEF 15%, small nonspecific basal inferoseptal RV insertion site LGE, mild elevation in ECV, not suggestive of cardiac amyloidosis. 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 LifeVest placed.   Home sleep study 09/2021 AHI 0.6/hr   Echo 01/11/22: EF 25-30% LV markedly dilated. Severe dyssynchrony. RV normal. Mild to moderate MR. Referred to EP for CRT-D.    Underwent CRT-D implant 03/2022.    Follow up 07/2022, still with NYHA II-III symptoms despite CRT-D, volume ok. Echo repeated, considering CPX if no better. Amiodarone decreased to 100 mg daily.   Echo (07/2022): EF 30-35%, moderate LV dysfunction with global HK, grade I DD, RV ok   S/p laparoscopic bilateral inguinal hernia repair and open ventral hernia repair (11/2022). Follow up 11/2022, felt like he had more energy after CRT-D. NYHA II and volume OK. CPX arranged which showed mild HF limitation.  Bedside echo 03/07/23, EF 30%   Returned to [facility] for HF follow up on 03/07/23, needed surgical clearance for left hernia repair. 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 Symptoms improved about 6 weeks after stopping meds. More SOB now, dyspnea with walking up hills. Had episode of chest pain while driving last week, resolved spontaneously. Denies palpitations, dizziness, edema, or PND/Orthopnea. Appetite ok. No fever or chills. Weight at home 170 pounds. Has left hernia surgery on 03/13/23 at [facility].  Presented to HF clinic for pharmacist medication titration 04/02/23. Was now s/p L hernia repair and doing well. Overall he was feeling well. Stated he was feeling fine, just bored from being out of work. No dizziness, lightheadedness, CP or palpations. Stated he used to get SOB in the mornings after waking up, but this had improved after restarting medications. Had not had any grogginess/fogginess after restarting carvedilol and Entresto. No SOB/DOE. Weight at home had been stable at 176-178 lbs. He does not need a loop diuretic. No LEE, PND or orthopnea. Reported taking all medications as prescribed and tolerating all medications. His rheumatologist started him on methotrexate 10 mg weekly for rheumatoid arthritis.   Today he returns to HF clinic for pharmacist medication titration. At recent visits to clinic, Entresto, carvedilol, spironolactone and Marcelline Deist were restarted. Two week Zio patch was placed. Overall he is feeling well today.  No dizziness, lightheadedness, CP or palpitations. Notes having less episodes of SOB in the morning. No SOB/DOE while on flat ground but will get SOB when going up hill. No LEE, PND or orthopnea. Taking and tolerating all medications. Appetite is ok. Says he tends to eat just one  large meal a day. He has been eating more bananas and cantaloupe.    HF Medications: Carvedilol 3.125 mg BID Entresto 24/26 mg BID Spironolactone 25 mg daily Farxiga 10 mg daily  Has the patient been experiencing any side effects to the medications prescribed?   no  Does the patient have any problems obtaining medications due to transportation or finances?    Chartered loss adjuster.   Understanding of regimen: good Understanding of indications: good Potential of compliance: good Patient understands to avoid NSAIDs. Patient understands to avoid decongestants.    Pertinent Lab Values: 04/02/23: Serum creatinine 0.81, BUN 6, Potassium 4.6, Sodium 138, BNP 87.5 05/02/23: Serum creatinine 0.79, BUN 11, Potassium 5.2, Sodium 138  Vital Signs:  Weight: 172.4 lbs (last clinic weight: 175.4 lbs) Blood pressure: 114/64  Heart rate: 81   Assessment/Plan: 1. Chronic Systolic HF - Echo (08/2021): EF 20-25%. LV dilated (6.5cm) with severe central MR. Global HK with ? AK of anteroseptum  - RHC/LHC (08/2021) with minimal obstructive CAD, low filling pressure, normal output and severe NICM - cMRI (08/2021): LVEF 14%, diffuse HK, mild/mod RV dilation w/ severe systolic dysfunction, RVEF 15%, small nonspecific basal inferoseptal RV insertion site LGE, mild elevation in ECV, not suggestive of cardiac amyloidosis. - Echo (12/2021): EF 25-30% LV markedly dilated. Severe dyssynchrony. RV normal. 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  - Increase carvedilol to 6.25 mg BID. - Continue Entresto 24/26 mg BID. - Continue spironolactone 25 mg daily. Potassium level on BMET 05/02/23 was 5.2. Patient notes he eats a lot of bananas and cantaloupe, which are high in potassium. We discussed cutting these out and following a low potassium diet.  - Continue Farxiga 10 mg daily - Repeat echo scheduled for 06/24/23.    2. Frequent PVCs - Previously on amiodarone, now stopped.  - None on ECG 03/07/23. Appropriate BiV pacing on ICD - Sleep study normal - Placed 2 week Zio to follow  PVC burden off amiodarone, results pending.   3. Severe mitral regurgitation - Functional. - Mild to moderate MR on echo 12/2021. - Now trivial MR on echo 07/2022   4. COPD - Quit smoking 2009. - Stable, no change.   5. Pre-Diabetes - A1C 6.0 - Continue Farxiga    6. H/o prostate CA - s/p radioactive seeds   7. Snoring - Home sleep study (09/2021) AHI 0.6/hr  8. CAD - minimal non-obstructive CAD on [facility] 09/10/21 - Continue rosuvastatin 10 mg daily. Monitor carefully for side effects regarding concentration and memory impairment. Atorvastatin was stopped before for possibly contributing to these symptoms. Currently tolerating well with no symptoms.   Follow up 3 weeks with Pharmacy Clinic.   Karle Plumber, PharmD, BCPS, BCCP, CPP Heart Failure Clinic Pharmacist [phone]

## 2023-04-30 ENCOUNTER — Encounter: Payer: Self-pay | Admitting: Physical Medicine and Rehabilitation

## 2023-05-01 ENCOUNTER — Other Ambulatory Visit: Payer: Self-pay | Admitting: Physical Medicine and Rehabilitation

## 2023-05-01 DIAGNOSIS — M47816 Spondylosis without myelopathy or radiculopathy, lumbar region: Secondary | ICD-10-CM

## 2023-05-01 DIAGNOSIS — M545 Low back pain, unspecified: Secondary | ICD-10-CM

## 2023-05-02 ENCOUNTER — Other Ambulatory Visit (HOSPITAL_COMMUNITY): Payer: Self-pay | Admitting: Internal Medicine

## 2023-05-02 NOTE — Progress Notes (Signed)
Remote ICD transmission.   

## 2023-05-07 LAB — SPECIMEN STATUS REPORT

## 2023-05-07 LAB — BASIC METABOLIC PANEL
BUN/Creatinine Ratio: 14
BUN: 11 mg/dL
Chloride: 101 mmol/L (ref 96–106)
Creatinine, Ser: 0.79 mg/dL
Glucose: 86 mg/dL (ref 70–99)
Sodium: 138 mmol/L (ref 134–144)

## 2023-05-08 NOTE — Addendum Note (Signed)
Encounter addended by: Crissie Figures, RN on: 05/08/2023 10:43 AM  Actions taken: Imaging Exam ended

## 2023-05-15 ENCOUNTER — Ambulatory Visit (HOSPITAL_COMMUNITY)
Admission: RE | Admit: 2023-05-15 | Discharge: 2023-05-15 | Disposition: A | Discharge status: Home / Self Care | Payer: Commercial Managed Care - HMO | Source: Ambulatory Visit | Attending: Cardiology | Admitting: Cardiology

## 2023-05-15 VITALS — BP 114/64 | HR 81 | Wt 172.4 lb

## 2023-05-15 DIAGNOSIS — I340 Nonrheumatic mitral (valve) insufficiency: Secondary | ICD-10-CM | POA: Insufficient documentation

## 2023-05-15 DIAGNOSIS — Z8546 Personal history of malignant neoplasm of prostate: Secondary | ICD-10-CM | POA: Diagnosis not present

## 2023-05-15 DIAGNOSIS — R0683 Snoring: Secondary | ICD-10-CM | POA: Diagnosis not present

## 2023-05-15 DIAGNOSIS — J439 Emphysema, unspecified: Secondary | ICD-10-CM | POA: Insufficient documentation

## 2023-05-15 DIAGNOSIS — Z79899 Other long term (current) drug therapy: Secondary | ICD-10-CM | POA: Insufficient documentation

## 2023-05-15 DIAGNOSIS — I110 Hypertensive heart disease with heart failure: Secondary | ICD-10-CM | POA: Insufficient documentation

## 2023-05-15 DIAGNOSIS — R7303 Prediabetes: Secondary | ICD-10-CM | POA: Insufficient documentation

## 2023-05-15 DIAGNOSIS — I2510 Atherosclerotic heart disease of native coronary artery without angina pectoris: Secondary | ICD-10-CM | POA: Insufficient documentation

## 2023-05-15 DIAGNOSIS — Z7984 Long term (current) use of oral hypoglycemic drugs: Secondary | ICD-10-CM | POA: Insufficient documentation

## 2023-05-15 DIAGNOSIS — I5022 Chronic systolic (congestive) heart failure: Secondary | ICD-10-CM | POA: Diagnosis not present

## 2023-05-15 DIAGNOSIS — I428 Other cardiomyopathies: Secondary | ICD-10-CM | POA: Diagnosis not present

## 2023-05-15 DIAGNOSIS — I493 Ventricular premature depolarization: Secondary | ICD-10-CM | POA: Diagnosis not present

## 2023-05-15 DIAGNOSIS — Z87891 Personal history of nicotine dependence: Secondary | ICD-10-CM | POA: Insufficient documentation

## 2023-05-15 MED ORDER — CARVEDILOL 6.25 MG PO TABS: 6.2500 mg | ORAL_TABLET | Freq: Two times a day (BID) | ORAL | 3 refills | Status: DC

## 2023-05-15 NOTE — Patient Instructions (Signed)
It was a pleasure seeing you today!  MEDICATIONS: -We are changing your medications today -Increase carvedilol to 6.25 mg (1 tablet) twice daily. You may take 2 tablets of the 3.125 mg strength twice daily until you pick up the new strength.  -Please follow a low potassium diet (avoid foods like potassium and cantaloupe).  -Call if you have questions about your medications.   NEXT APPOINTMENT: Return to clinic in 1 month with Dr. Gala Romney.  In general, to take care of your heart failure: -Limit your fluid intake to 2 Liters (half-gallon) per day.   -Limit your salt intake to ideally 2-3 grams (2000-3000 mg) per day. -Weigh yourself daily and record, and bring that "weight diary" to your next appointment.  (Weight gain of 2-3 pounds in 1 day typically means fluid weight.) -The medications for your heart are to help your heart and help you live longer.   -Please contact us before stopping any of your heart medications.  Call the clinic at [phone] with questions or to reschedule future appointments.

## 2023-05-19 ENCOUNTER — Other Ambulatory Visit (HOSPITAL_COMMUNITY): Payer: Self-pay | Admitting: Internal Medicine

## 2023-05-19 ENCOUNTER — Other Ambulatory Visit: Payer: Self-pay

## 2023-05-19 ENCOUNTER — Ambulatory Visit: Payer: Commercial Managed Care - HMO | Admitting: Physical Medicine and Rehabilitation

## 2023-05-19 VITALS — BP 110/67 | HR 76

## 2023-05-19 DIAGNOSIS — M47816 Spondylosis without myelopathy or radiculopathy, lumbar region: Secondary | ICD-10-CM | POA: Diagnosis not present

## 2023-05-19 MED ORDER — METHYLPREDNISOLONE ACETATE 80 MG/ML IJ SUSP
80.0000 mg | Freq: Once | INTRAMUSCULAR | Status: AC
Start: 2023-05-19 — End: 2023-05-19
  Administered 2023-05-19: 80 mg

## 2023-05-19 NOTE — Progress Notes (Signed)
Functional Pain Scale - descriptive words and definitions  Uncomfortable (3)  Pain is present but can complete all ADL's/sleep is slightly affected and passive distraction only gives marginal relief. Mild range order  Average Pain  varies, worse as the day goes on   +Driver, -BT, -Dye Allergies.  Lower back pain on left side with no radiation in the legs. Hard to bend over

## 2023-05-19 NOTE — Patient Instructions (Signed)

## 2023-05-26 ENCOUNTER — Ambulatory Visit (INDEPENDENT_AMBULATORY_CARE_PROVIDER_SITE_OTHER): Payer: Commercial Managed Care - HMO | Admitting: Internal Medicine

## 2023-05-26 ENCOUNTER — Encounter: Payer: Self-pay | Admitting: Internal Medicine

## 2023-05-26 VITALS — BP 109/72 | HR 84 | Ht 68.0 in | Wt 169.8 lb

## 2023-05-26 DIAGNOSIS — I5022 Chronic systolic (congestive) heart failure: Secondary | ICD-10-CM

## 2023-05-26 DIAGNOSIS — E782 Mixed hyperlipidemia: Secondary | ICD-10-CM

## 2023-05-26 DIAGNOSIS — Z87891 Personal history of nicotine dependence: Secondary | ICD-10-CM | POA: Insufficient documentation

## 2023-05-26 DIAGNOSIS — I10 Essential (primary) hypertension: Secondary | ICD-10-CM | POA: Diagnosis not present

## 2023-05-26 DIAGNOSIS — E785 Hyperlipidemia, unspecified: Secondary | ICD-10-CM | POA: Insufficient documentation

## 2023-05-26 DIAGNOSIS — M1990 Unspecified osteoarthritis, unspecified site: Secondary | ICD-10-CM

## 2023-05-26 DIAGNOSIS — R7303 Prediabetes: Secondary | ICD-10-CM | POA: Insufficient documentation

## 2023-05-26 DIAGNOSIS — C61 Malignant neoplasm of prostate: Secondary | ICD-10-CM | POA: Diagnosis not present

## 2023-05-26 DIAGNOSIS — I2510 Atherosclerotic heart disease of native coronary artery without angina pectoris: Secondary | ICD-10-CM

## 2023-05-26 NOTE — Progress Notes (Signed)
New Patient Office Visit  Subjective    Patient ID: Ruben Duffy, male    DOB: 1966-08-19  Age: 57 y.o. MRN: [id number]  CC:  Chief Complaint  Patient presents with   Establish Care    HPI Ruben Duffy presents to establish care.  He he is a 57 year old male with a past medical history significant for HFrEF, HTN, HLD, prostate cancer, prediabetes, chronic back pain, CAD, inflammatory arthritis, and former tobacco use.  Previously followed by Dr. Mayford Knife.  Ruben Duffy reports feeling well today.  He is asymptomatic currently and has no acute concerns to discuss aside from desiring to establish care.  He currently works as a Estate agent.  He endorses daily marijuana use and occasional alcohol consumption.  He additionally endorses former tobacco use but quit smoking cigarettes in 2009.  His family medical history is significant for COPD, CVA, lung cancer, and diabetes mellitus.  Chronic medical conditions and outstanding preventative care items discussed today are individually addressed A/P below.   Outpatient Encounter Medications as of 05/26/2023  Medication Sig   carvedilol (COREG) 6.25 MG tablet Take 1 tablet (6.25 mg total) by mouth 2 (two) times daily with a meal.   FARXIGA 10 MG TABS tablet Take 1 tablet (10 mg total) by mouth daily.   folic acid (FOLVITE) 1 MG tablet Take 1 mg by mouth daily.   methotrexate (RHEUMATREX) 2.5 MG tablet Take 15 mg by mouth once a week.   Multiple Vitamin (MULTIVITAMIN ADULT PO) Take by mouth daily.   rosuvastatin (CRESTOR) 10 MG tablet Take 1 tablet (10 mg total) by mouth daily.   sacubitril-valsartan (ENTRESTO) 24-26 MG Take 1 tablet by mouth 2 (two) times daily.   spironolactone (ALDACTONE) 25 MG tablet Take 1 tablet (25 mg total) by mouth daily.   Facility-Administered Encounter Medications as of 05/26/2023  Medication   methylPREDNISolone acetate (DEPO-MEDROL) injection 80 mg    Past Medical History:  Diagnosis Date   AICD (automatic  cardioverter/defibrillator) present 04/08/2022   a.) St. Jude Quadra Assura CRT-D   Arthritis    Bilateral inguinal hernia    a.) s/p BILATERAL repair 11/19/2022; b.) recurrent hernia on LEFT   CAD (coronary artery disease) 09/10/2021   a.) R/LHC 09/10/2021: 30% pLCx - med mgmt.   Cardiomegaly    CHF (congestive heart failure) (HCC)    a.) TTE 09/07/2021: EF 25-30%, diff inf HK, mod LV dil, mod LAE, mod-sev MR; b.) R/LHC 09/10/2021: EF 25%, mPA 13, PCWP 5, CO 6.2, CI 3.1; c.) cMRI 09/11/2021: EF 14%, sev LV dil, diff HK; d.) TTE 01/11/2022: EF 25-30%, glob HK, mod LV dil, mod LAE, mild MR, G2DD; e.) CRT-D implanted 04/08/2022; f.) TTE 08/08/2022: EF 30-35%, glob HK, triv MR, G1DD   DDD (degenerative disc disease), lumbar    Emphysema of lung (HCC)    Erectile dysfunction    a.) on PDE5i (sildenafil) PRN   GERD (gastroesophageal reflux disease)    Hemorrhoids    Hepatitis C virus infection cured after antiviral drug therapy    History of substance abuse (HCC)    a.) THC + crack cocaine   HLD (hyperlipidemia)    Hypertension    Hypospadias, balanic    LBBB (left bundle branch block) 04/26/2021   a.) noted on preop ECG 04/26/2021   Long term current use of amiodarone    Lower urinary tract symptoms (LUTS)    NICM (nonischemic cardiomyopathy) (HCC)    a.) TTE 09/07/2021: EF 25-30%; b.)  R/LHC 09/10/2021: mPA 13, PCWP 5, CO 6.2, CI 3.1; c.) cMRI 09/11/2021: EF 14%, diff HK, RV insertion site LGE; d.) TTE 01/11/2022: EF 25-30% e.) CRT-D implanted 04/08/2022; f.) TTE 08/08/2022: EF 30-35%   Pre-diabetes    Prostate cancer (HCC) 01/08/2021   a.) Gleason 3+4, T2a; b.) s/p brachytherapy 06/05/2021   Umbilical hernia    a.) s/p repair 11/19/2022    Past Surgical History:  Procedure Laterality Date   BIOPSY PROSTATE     BIV ICD INSERTION CRT-D N/A 04/08/2022   Procedure: BIV ICD INSERTION CRT-D;  Surgeon: Duke Salvia, MD;  Location: [facility] INVASIVE CV LAB;  Service: Cardiovascular;   Laterality: N/A;   CIRCUMCISION N/A 06/05/2021   Procedure: CIRCUMCISION ADULT;  Surgeon: Bjorn Pippin, MD;  Location: [facility];  Service: Urology;  Laterality: N/A;   COLONOSCOPY W/ POLYPECTOMY     HEMORRHOID SURGERY     HERNIA REPAIR     Had 3 fixed, had re-repair the left groin hernia.   HYPOSPADIAS CORRECTION     INSERTION OF MESH  11/19/2022   Procedure: INSERTION OF MESH;  Surgeon: Leafy Ro, MD;  Location: [facility] OR[facility];  Service: General;;   RADIOACTIVE SEED IMPLANT N/A 06/05/2021   Procedure: RADIOACTIVE SEED IMPLANT/BRACHYTHERAPY IMPLANT;  Surgeon: Bjorn Pippin, MD;  Location: [facility];  Service: Urology;  Laterality: N/A;   RIGHT/LEFT HEART CATH AND CORONARY ANGIOGRAPHY N/A 09/10/2021   Procedure: RIGHT/LEFT HEART CATH AND CORONARY ANGIOGRAPHY;  Surgeon: Dolores Patty, MD;  Location: [facility] INVASIVE CV LAB;  Service: Cardiovascular;  Laterality: N/A;   SPACE OAR INSTILLATION N/A 06/05/2021   Procedure: SPACE OAR INSTILLATION;  Surgeon: Bjorn Pippin, MD;  Location: [facility];  Service: Urology;  Laterality: N/A;   UMBILICAL HERNIA REPAIR N/A 11/19/2022   Procedure: HERNIA REPAIR UMBILICAL ADULT;  Surgeon: Leafy Ro, MD;  Location: [facility] OR[facility];  Service: General;  Laterality: N/A;    Family History  Problem Relation Age of Onset   COPD Mother    Stroke Father    Lung cancer Maternal Aunt    Diabetes Maternal Uncle    Brain cancer Maternal Uncle    Lung cancer Maternal Uncle    Lung cancer Maternal Uncle    Cancer Paternal Grandmother    Diabetes Paternal Grandfather    Colon cancer Neg Hx    Esophageal cancer Neg Hx    Stomach cancer Neg Hx    Pancreatic cancer Neg Hx    Colon polyps Neg Hx    Rectal cancer Neg Hx     Social History   Socioeconomic History   Marital status: Single    Spouse name: Not on file   Number of children: 1   Years of education: Not on file   Highest education level: Not on file   Occupational History   Occupation: [employer]    Comment: Estate agent  Tobacco Use   Smoking status: Former    Current packs/day: 0.00    Average packs/day: 1.8 packs/day for 30.0 years (52.5 ttl pk-yrs)    Types: Cigarettes    Start date: 8    Quit date: 09/13/2008    Years since quitting: 14.7    Passive exposure: Past   Smokeless tobacco: Never  Vaping Use   Vaping status: Never Used  Substance and Sexual Activity   Alcohol use: Not Currently   Drug use: Not Currently    Types: "Crack" cocaine, Marijuana   Sexual  activity: Not Currently  Other Topics Concern   Not on file  Social History Narrative   Lives with mother   Social Determinants of Health   Financial Resource Strain: Low Risk  (07/27/2021)   Overall Financial Resource Strain (CARDIA)    Difficulty of Paying Living Expenses: Not hard at all  Food Insecurity: No Food Insecurity (07/27/2021)   Hunger Vital Sign    Worried About Running Out of Food in the Last Year: Never true    Ran Out of Food in the Last Year: Never true  Transportation Needs: No Transportation Needs (07/27/2021)   PRAPARE - Administrator, Civil Service (Medical): No    Lack of Transportation (Non-Medical): No  Physical Activity: Inactive (07/27/2021)   Exercise Vital Sign    Days of Exercise per Week: 0 days    Minutes of Exercise per Session: 0 min  Stress: No Stress Concern Present (07/27/2021)   [employer] of Occupational Health - Occupational Stress Questionnaire    Feeling of Stress : Not at all  Social Connections: Socially Isolated (07/27/2021)   Social Connection and Isolation Panel [NHANES]    Frequency of Communication with Friends and Family: Three times a week    Frequency of Social Gatherings with Friends and Family: Three times a week    Attends Religious Services: Never    Active Member of Clubs or Organizations: No    Attends Banker Meetings: Never    Marital Status:  Never married  Intimate Partner Violence: Not At Risk (07/27/2021)   Humiliation, Afraid, Rape, and Kick questionnaire    Fear of Current or Ex-Partner: No    Emotionally Abused: No    Physically Abused: No    Sexually Abused: No   Review of Systems  Constitutional:  Negative for chills and fever.  HENT:  Negative for sore throat.   Respiratory:  Negative for cough and shortness of breath.   Cardiovascular:  Negative for chest pain, palpitations and leg swelling.  Gastrointestinal:  Negative for abdominal pain, blood in stool, constipation, diarrhea, nausea and vomiting.  Genitourinary:  Negative for dysuria and hematuria.  Musculoskeletal:  Negative for myalgias.  Skin:  Negative for itching and rash.  Neurological:  Negative for dizziness and headaches.  Psychiatric/Behavioral:  Negative for depression and suicidal ideas.    Objective    BP 109/72   Pulse 84   Ht 5\' 8"  (1.727 m)   Wt 169 lb 12.8 oz (77 kg)   SpO2 96%   BMI 25.82 kg/m   Physical Exam Vitals reviewed.  Constitutional:      General: He is not in acute distress.    Appearance: Normal appearance. He is not ill-appearing.  HENT:     Head: Normocephalic and atraumatic.     Right Ear: External ear normal.     Left Ear: External ear normal.     Nose: Nose normal. No congestion or rhinorrhea.     Mouth/Throat:     Mouth: Mucous membranes are moist.     Pharynx: Oropharynx is clear.  Eyes:     General: No scleral icterus.    Extraocular Movements: Extraocular movements intact.     Conjunctiva/sclera: Conjunctivae normal.     Pupils: Pupils are equal, round, and reactive to light.  Cardiovascular:     Rate and Rhythm: Normal rate and regular rhythm.     Pulses: Normal pulses.     Heart sounds: Normal heart sounds. No murmur heard. Pulmonary:  Effort: Pulmonary effort is normal.     Breath sounds: Normal breath sounds. No wheezing, rhonchi or rales.  Abdominal:     General: Abdomen is flat. Bowel  sounds are normal. There is no distension.     Palpations: Abdomen is soft.     Tenderness: There is no abdominal tenderness.  Musculoskeletal:        General: No swelling or deformity. Normal range of motion.     Cervical back: Normal range of motion.  Skin:    General: Skin is warm and dry.     Capillary Refill: Capillary refill takes less than 2 seconds.  Neurological:     General: No focal deficit present.     Mental Status: He is alert and oriented to person, place, and time.     Motor: No weakness.  Psychiatric:        Mood and Affect: Mood normal.        Behavior: Behavior normal.        Thought Content: Thought content normal.    Assessment & Plan:   Problem List Items Addressed This Visit       Essential hypertension    Well-controlled on current regimen of Entresto, spironolactone, and carvedilol.  No changes are indicated today.      Chronic systolic heart failure (HCC) - Primary    Followed by the advanced heart failure clinic.  EF 30-35% on most recent echo from October 2023.  S/p CRT-D implantation in June 2023.  He is euvolemic on exam today and asymptomatic.  Current GDMT regimen consist of Entresto 24-26 mg twice daily, spironolactone 25 mg daily, carvedilol 6.25 mg twice daily, and Farxiga 10 mg daily.  No medication changes are indicated today.  Cardiology follow-up is scheduled for next month.      CAD (coronary artery disease)    Mild, nonobstructive CAD noted on previous imaging.  He is currently prescribed rosuvastatin 10 mg daily.      Inflammatory arthritis    He endorses a history of inflammatory arthritis, mostly in his left wrist.  Followed by rheumatology at [facility] rheumatology and is being treated with methotrexate + folic acid supplementation.  States that he was told his clinical findings are most consistent with RA, but labs not necessarily suggestive of RA.      Malignant neoplasm of prostate (HCC)    S/p radioactive seed implantation.   Followed by urology (Dr. Annabell Howells).      Hyperlipidemia    Lipid panel updated in June.  Total cholesterol 191 and LDL 110.  He is currently prescribed rosuvastatin 10 mg.  No medication changes are indicated at this time.  Repeat lipid panel at follow-up in 3 months.      Former tobacco use    He endorses former tobacco use, quitting in 2009.  Accumulated a 52.5-pack-year smoking history.  He is enrolled in lung cancer screening, last completed in February of this year.  Repeat low-dose CT scan recommended for 12 months.      Prediabetes    A1c 6.3 on labs from June.  He is currently prescribed Farxiga 10 mg daily.  We reviewed the importance of making significant dietary changes in order to improve his blood sugar and prevent progression to diabetes.      Return in about 3 months (around 08/26/2023).   Billie Lade, MD

## 2023-05-26 NOTE — Assessment & Plan Note (Signed)
A1c 6.3 on labs from June.  He is currently prescribed Farxiga 10 mg daily.  We reviewed the importance of making significant dietary changes in order to improve his blood sugar and prevent progression to diabetes.

## 2023-05-26 NOTE — Patient Instructions (Signed)
It was a pleasure to see you today.  Thank you for giving [place] the opportunity to be involved in your care.  Below is a brief recap of your visit and next steps.  We will plan to see you again in 3 months.  Summary You have established care today No medication changes have been made We will follow up in 3 months

## 2023-05-26 NOTE — Assessment & Plan Note (Signed)
Well-controlled on current regimen of Entresto, spironolactone, and carvedilol.  No changes are indicated today.

## 2023-05-26 NOTE — Assessment & Plan Note (Signed)
S/p radioactive seed implantation.  Followed by urology (Dr. Annabell Howells).

## 2023-05-26 NOTE — Assessment & Plan Note (Addendum)
Mild, nonobstructive CAD noted on previous imaging.  He is currently prescribed rosuvastatin 10 mg daily.

## 2023-05-26 NOTE — Assessment & Plan Note (Addendum)
He endorses former tobacco use, quitting in 2009.  Accumulated a 52.5-pack-year smoking history.  He is enrolled in lung cancer screening, last completed in February of this year.  Repeat low-dose CT scan recommended for 12 months.

## 2023-05-26 NOTE — Assessment & Plan Note (Signed)
He endorses a history of inflammatory arthritis, mostly in his left wrist.  Followed by rheumatology at [facility] rheumatology and is being treated with methotrexate + folic acid supplementation.  States that he was told his clinical findings are most consistent with RA, but labs not necessarily suggestive of RA.

## 2023-05-26 NOTE — Assessment & Plan Note (Signed)
Lipid panel updated in June.  Total cholesterol 191 and LDL 110.  He is currently prescribed rosuvastatin 10 mg.  No medication changes are indicated at this time.  Repeat lipid panel at follow-up in 3 months.

## 2023-05-26 NOTE — Assessment & Plan Note (Addendum)
Followed by the advanced heart failure clinic.  EF 30-35% on most recent echo from October 2023.  S/p CRT-D implantation in June 2023.  He is euvolemic on exam today and asymptomatic.  Current GDMT regimen consist of Entresto 24-26 mg twice daily, spironolactone 25 mg daily, carvedilol 6.25 mg twice daily, and Farxiga 10 mg daily.  No medication changes are indicated today.  Cardiology follow-up is scheduled for next month.

## 2023-05-31 NOTE — Progress Notes (Signed)
Ruben Duffy - 57 y.o. male MRN [id number]  Date of birth: Mar 06, 1966  Office Visit Note: Visit Date: 05/19/2023 PCP: Billie Lade, MD Referred by: Donetta Potts, MD  Subjective: Chief Complaint  Patient presents with   Lower Back - Pain   HPI:  Ruben Duffy is a 57 y.o. male who comes in today at the request of Ellin Goodie, FNP for planned Left  L5-S1 Lumbar facet/medial branch block with fluoroscopic guidance.  The patient has failed conservative care including home exercise, medications, time and activity modification.  This injection will be diagnostic and hopefully therapeutic.  Please see requesting physician notes for further details and justification.  Exam has shown concordant pain with facet joint loading.   ROS Otherwise per HPI.  Assessment & Plan: Visit Diagnoses:    ICD-10-CM   1. Spondylosis without myelopathy or radiculopathy, lumbar region  M47.816 XR C-ARM NO REPORT    Facet Injection    methylPREDNISolone acetate (DEPO-MEDROL) injection 80 mg      Plan: No additional findings.   Meds & Orders:  Meds ordered this encounter  Medications   methylPREDNISolone acetate (DEPO-MEDROL) injection 80 mg    Orders Placed This Encounter  Procedures   Facet Injection   XR C-ARM NO REPORT    Follow-up: Return for visit to requesting provider as needed.   Procedures: No procedures performed  Lumbar Facet Joint Intra-Articular Injection(s) with Fluoroscopic Guidance  Patient: Ruben Duffy      Date of Birth: 17-Nov-1965 MRN: [id number] PCP: Billie Lade, MD      Visit Date: 05/19/2023   Universal Protocol:    Date/Time: 05/19/2023  Consent Given By: the patient  Position: PRONE   Additional Comments: Vital signs were monitored before and after the procedure. Patient was prepped and draped in the usual sterile fashion. The correct patient, procedure, and site was verified.   Injection Procedure Details:  Procedure Site One Meds  Administered:  Meds ordered this encounter  Medications   methylPREDNISolone acetate (DEPO-MEDROL) injection 80 mg     Laterality: Left  Location/Site:  L5-S1  Needle size: 22 guage  Needle type: Spinal  Needle Placement: Articular  Findings:  -Comments: Excellent flow of contrast producing a partial arthrogram.  Procedure Details: The fluoroscope beam is vertically oriented in AP, and the inferior recess is visualized beneath the lower pole of the inferior apophyseal process, which represents the target point for needle insertion. When direct visualization is difficult the target point is located at the medial projection of the vertebral pedicle. The region overlying each aforementioned target is locally anesthetized with a 1 to 2 ml. volume of 1% Lidocaine without Epinephrine.   The spinal needle was inserted into each of the above mentioned facet joints using biplanar fluoroscopic guidance. A 0.25 to 0.5 ml. volume of Isovue-250 was injected and a partial facet joint arthrogram was obtained. A single spot film was obtained of the resulting arthrogram.    One to 1.25 ml of the steroid/anesthetic solution was then injected into each of the facet joints noted above.   Additional Comments:  No complications occurred Dressing: 2 x 2 sterile gauze and Band-Aid    Post-procedure details: Patient was observed during the procedure. Post-procedure instructions were reviewed.  Patient left the clinic in stable condition.    Clinical History: MRI LUMBAR SPINE WITHOUT CONTRAST   TECHNIQUE: Multiplanar, multisequence MR imaging of the lumbar spine was performed. No intravenous contrast was administered.   COMPARISON:  None Available.   FINDINGS: Segmentation:  Standard.   Alignment: Trace retrolisthesis at L1-L2. Trace anterolisthesis at L5-S1.   Vertebrae: Chronic anterior wedging of L1. Degenerative endplate irregularity at T12-L1: L1-L2, L3-L4, and L4-L5. Mild  degenerative endplate marrow edema. Vertebral body hemangioma at L4.   Conus medullaris and cauda equina: Conus extends to the L1 level. Conus and cauda equina appear normal.   Paraspinal and other soft tissues: Unremarkable.   Disc levels:   T12-L1: Minimal disc bulge.  No canal or foraminal stenosis.   L1-L2: Disc bulge with superimposed left foraminal protrusion and endplate osteophytic ridging. Minor facet arthropathy. No canal stenosis. No right foraminal stenosis. Mild to moderate left foraminal stenosis.   L2-L3: Disc bulge slightly eccentric to the right. Moderate facet arthropathy. No canal stenosis. Minor effacement of the right subarticular recess. No foraminal stenosis.   L3-L4: Disc bulge with superimposed right foraminal/far lateral protrusion and endplate osteophytic ridging. Moderate facet arthropathy with ligamentum flavum infolding. Mild canal stenosis. Partial effacement of the right subarticular recess. Moderate right and minor left foraminal stenosis.   L4-L5:  Minor facet arthropathy.  No canal or foraminal stenosis.   L5-S1: Anterolisthesis with uncovering of disc bulge with superimposed left foraminal protrusion. Moderate to marked facet arthropathy with ligamentum flavum infolding. No canal stenosis. Minor right and marked left foraminal stenosis.   IMPRESSION: Multilevel degenerative changes as detailed above. Facet arthropathy is greatest at L2-L3, L3-L4, and L5-S1. No high-grade canal stenosis. Foraminal narrowing is greatest on the right at L3-L4 and on the left at L5-S1. Right subarticular recess narrowing is present at L3-L4.     Electronically Signed   By: Guadlupe Spanish M.D.   On: 03/08/2022 12:31     Objective:  VS:  HT:    WT:   BMI:     BP:110/67  HR:76bpm  TEMP: ( )  RESP:  Physical Exam Vitals and nursing note reviewed.  Constitutional:      General: He is not in acute distress.    Appearance: Normal appearance. He is  not ill-appearing.  HENT:     Head: Normocephalic and atraumatic.     Right Ear: External ear normal.     Left Ear: External ear normal.     Nose: No congestion.  Eyes:     Extraocular Movements: Extraocular movements intact.  Cardiovascular:     Rate and Rhythm: Normal rate.     Pulses: Normal pulses.  Pulmonary:     Effort: Pulmonary effort is normal. No respiratory distress.  Abdominal:     General: There is no distension.     Palpations: Abdomen is soft.  Musculoskeletal:        General: No tenderness or signs of injury.     Cervical back: Neck supple.     Right lower leg: No edema.     Left lower leg: No edema.     Comments: Patient has good distal strength without clonus.  Skin:    Findings: No erythema or rash.  Neurological:     General: No focal deficit present.     Mental Status: He is alert and oriented to person, place, and time.     Sensory: No sensory deficit.     Motor: No weakness or abnormal muscle tone.     Coordination: Coordination normal.  Psychiatric:        Mood and Affect: Mood normal.        Behavior: Behavior normal.      Imaging:  No results found.

## 2023-05-31 NOTE — Procedures (Signed)
Lumbar Facet Joint Intra-Articular Injection(s) with Fluoroscopic Guidance  Patient: Ruben Duffy      Date of Birth: 03/12/1966 MRN: [id number] PCP: Billie Lade, MD      Visit Date: 05/19/2023   Universal Protocol:    Date/Time: 05/19/2023  Consent Given By: the patient  Position: PRONE   Additional Comments: Vital signs were monitored before and after the procedure. Patient was prepped and draped in the usual sterile fashion. The correct patient, procedure, and site was verified.   Injection Procedure Details:  Procedure Site One Meds Administered:  Meds ordered this encounter  Medications   methylPREDNISolone acetate (DEPO-MEDROL) injection 80 mg     Laterality: Left  Location/Site:  L5-S1  Needle size: 22 guage  Needle type: Spinal  Needle Placement: Articular  Findings:  -Comments: Excellent flow of contrast producing a partial arthrogram.  Procedure Details: The fluoroscope beam is vertically oriented in AP, and the inferior recess is visualized beneath the lower pole of the inferior apophyseal process, which represents the target point for needle insertion. When direct visualization is difficult the target point is located at the medial projection of the vertebral pedicle. The region overlying each aforementioned target is locally anesthetized with a 1 to 2 ml. volume of 1% Lidocaine without Epinephrine.   The spinal needle was inserted into each of the above mentioned facet joints using biplanar fluoroscopic guidance. A 0.25 to 0.5 ml. volume of Isovue-250 was injected and a partial facet joint arthrogram was obtained. A single spot film was obtained of the resulting arthrogram.    One to 1.25 ml of the steroid/anesthetic solution was then injected into each of the facet joints noted above.   Additional Comments:  No complications occurred Dressing: 2 x 2 sterile gauze and Band-Aid    Post-procedure details: Patient was observed during the  procedure. Post-procedure instructions were reviewed.  Patient left the clinic in stable condition.

## 2023-06-23 NOTE — Progress Notes (Signed)
Advanced Heart Failure Clinic Note   Primary Care: Billie Lade, MD EP: Dr. Graciela Husbands HF Cardiologist: Dr. 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  He was seen by Dr Allyson Sabal 04/2021 for preop eval before radioactive seed implant and circumcision. He had LBBB of unknown duration, BP well-controlled. No further workup pre-procedure.  Admitted 11/22 for a/c systolic HF. Echo EF 20-25%. LV dilated (6.5cm) with severe central MR. Global HK with ? AK of anteroseptum.  Diuresed with IV lasix. Initiated on GDMT with digoxin, spironolactone, Entresto and Farxiga. Frequent PVCs, started on amio w/ reduction in PVC burden. R/LHC showed minimal nonobstructive CAD, cMRI showed LVEF 14%, diffuse HK, mild/mod RV dilation w/ severe systolic dysfunction, RVEF 15%, small nonspecific basal inferoseptal RV insertion site LGE, mild elevation in ECV, not suggestive of cardiac amyloidosis. 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 LifeVest placed  Home sleep study 12/22 AHI 0.6/hr  Echo 01/11/22: EF 25-30% LV markedly dilated. Severe dyssynchrony. RV normal. Mild to moderate MR. Referred to EP for CRT-D.   Underwent CRT-D implant 6/23.   Follow up 10/23, still with NYHA II-III symptoms despite CRT-D, volume ok. Echo repeated, considering CPX if no better. Amio decreased to 100 daily.  Echo (10/23): EF 30-35%, moderate LV dysfunction with global HK, grade I DD, RV ok  S/p laparoscopic bilateral inguinal hernia repair and open ventral hernia repair (2/24). Follow up 2/24, felt like he had more energy after CRT-D. NYHA II and volume OK. CPX arranged which showed mild HF limitation.  We saw him in 5/24 for pre-op clearance for hernia surgery. 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 Bedside echo 30%. He  was cleared. GDMT restarted.   Had uncomplicated hernia surgery 03/13/23. Has been following with HF PharmD for med titration .  Today he returns for HF follow up.   Cardiac Studies:  - CPX (4/24):  Pre-Exercise PFTs: FVC 3.64 (81%)      FEV1 2.77 (79%)        FEV1/FVC 76 (97%)        MVV 99 (72%)   Peak VO2: 28.4 (87% predicted peak VO2)  VE/VCO2 slope:  37  OUES: 2.58  Peak RER: 1.12  Ventilatory Threshold: 19.8 (57% predicted or measured peak VO2)  Peak RR 45  Peak Ventilation:  100.9  VE/MVV:  102%  PETCO2 at peak:  30   - Echo (10/23): EF 30-35%, moderate LV dysfunction with global HK, grade I DD, RV ok  - Echo (3/23): EF 25-30%, LV markedly dilated, severe dyssynchrony, RV ok, mild to moderate MR.  - Cardiac MRI (11/22):  LVEF 14%, diffuse HK, mild/mod RV dilation w/ severe systolic dysfunction, RVEF 15%, small nonspecific basal inferoseptal RV insertion site LGE, mild elevation in ECV, not suggestive of cardiac amyloidosis.   - Echo (11/22): EF 20-25%. LV dilated (6.5cm) with severe central MR. Global HK with ? AK of anteroseptum   - RHC/LHC (11/22) with minimal obstructive CAD, low filling pressure, normal output and severe NICM   ROS: All systems reviewed and negative except as per HPI.   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 Jude Quadra Assura CRT-D   Arthritis    Bilateral inguinal  hernia    a.) s/p BILATERAL repair 11/19/2022; b.) recurrent hernia on LEFT   CAD (coronary artery disease) 09/10/2021   a.) R/LHC 09/10/2021: 30% pLCx - med mgmt.   Cardiomegaly    CHF (congestive heart failure) (HCC)    a.) TTE 09/07/2021: EF 25-30%, diff inf HK, mod LV dil, mod LAE, mod-sev MR; b.) R/LHC 09/10/2021: EF 25%, mPA 13, PCWP 5, CO 6.2, CI 3.1; c.) cMRI 09/11/2021: EF 14%, sev LV dil, diff HK; d.) TTE 01/11/2022: EF 25-30%, glob HK, mod LV dil, mod LAE, mild MR, G2DD; e.) CRT-D implanted  04/08/2022; f.) TTE 08/08/2022: EF 30-35%, glob HK, triv MR, G1DD   DDD (degenerative disc disease), lumbar    Emphysema of lung (HCC)    Erectile dysfunction    a.) on PDE5i (sildenafil) PRN   GERD (gastroesophageal reflux disease)    Hemorrhoids    Hepatitis C virus infection cured after antiviral drug therapy    History of substance abuse (HCC)    a.) THC + crack cocaine   HLD (hyperlipidemia)    Hypertension    Hypospadias, balanic    LBBB (left bundle branch block) 04/26/2021   a.) noted on preop ECG 04/26/2021   Long term current use of amiodarone    Lower urinary tract symptoms (LUTS)    NICM (nonischemic cardiomyopathy) (HCC)    a.) TTE 09/07/2021: EF 25-30%; b.) R/LHC 09/10/2021: mPA 13, PCWP 5, CO 6.2, CI 3.1; c.) cMRI 09/11/2021: EF 14%, diff HK, RV insertion site LGE; d.) TTE 01/11/2022: EF 25-30% e.) CRT-D implanted 04/08/2022; f.) TTE 08/08/2022: EF 30-35%   Pre-diabetes    Prostate cancer (HCC) 01/08/2021   a.) Gleason 3+4, T2a; b.) s/p brachytherapy 06/05/2021   Umbilical hernia    a.) s/p repair 11/19/2022    Current Outpatient Medications  Medication Sig Dispense Refill   carvedilol (COREG) 6.25 MG tablet Take 1 tablet (6.25 mg total) by mouth 2 (two) times daily with a meal. 180 tablet 3   FARXIGA 10 MG TABS tablet Take 1 tablet (10 mg total) by mouth daily. 30 tablet 11   folic acid (FOLVITE) 1 MG tablet Take 1 mg by mouth daily.     methotrexate (RHEUMATREX) 2.5 MG tablet Take 15 mg by mouth once a week.     Multiple Vitamin (MULTIVITAMIN ADULT PO) Take by mouth daily.     rosuvastatin (CRESTOR) 10 MG tablet Take 1 tablet (10 mg total) by mouth daily. 90 tablet 3   sacubitril-valsartan (ENTRESTO) 24-26 MG Take 1 tablet by mouth 2 (two) times daily. 180 tablet 3   spironolactone (ALDACTONE) 25 MG tablet Take 1 tablet (25 mg total) by mouth daily. 90 tablet 3   No current facility-administered medications for this encounter.   No Known Allergies  Social  History   Socioeconomic History   Marital status: Single    Spouse name: Not on file   Number of children: 1   Years of education: Not on file   Highest education level: Not on file  Occupational History   Occupation: [employer]    Comment: Estate agent  Tobacco Use   Smoking status: Former    Current packs/day: 0.00    Average packs/day: 1.8 packs/day for 30.0 years (52.5 ttl pk-yrs)    Types: Cigarettes    Start date: 11    Quit date: 09/13/2008    Years since quitting: 14.7    Passive exposure: Past   Smokeless tobacco: Never  Vaping Use  Vaping status: Never Used  Substance and Sexual Activity   Alcohol use: Not Currently   Drug use: Not Currently    Types: "Crack" cocaine, Marijuana   Sexual activity: Not Currently  Other Topics Concern   Not on file  Social History Narrative   Lives with mother   Social Determinants of Health   Financial Resource Strain: Low Risk  (07/27/2021)   Overall Financial Resource Strain (CARDIA)    Difficulty of Paying Living Expenses: Not hard at all  Food Insecurity: No Food Insecurity (07/27/2021)   Hunger Vital Sign    Worried About Running Out of Food in the Last Year: Never true    Ran Out of Food in the Last Year: Never true  Transportation Needs: No Transportation Needs (07/27/2021)   PRAPARE - Administrator, Civil Service (Medical): No    Lack of Transportation (Non-Medical): No  Physical Activity: Inactive (07/27/2021)   Exercise Vital Sign    Days of Exercise per Week: 0 days    Minutes of Exercise per Session: 0 min  Stress: No Stress Concern Present (07/27/2021)   [employer] of Occupational Health - Occupational Stress Questionnaire    Feeling of Stress : Not at all  Social Connections: Socially Isolated (07/27/2021)   Social Connection and Isolation Panel [NHANES]    Frequency of Communication with Friends and Family: Three times a week    Frequency of Social Gatherings with Friends  and Family: Three times a week    Attends Religious Services: Never    Active Member of Clubs or Organizations: No    Attends Banker Meetings: Never    Marital Status: Never married  Intimate Partner Violence: Not At Risk (07/27/2021)   Humiliation, Afraid, Rape, and Kick questionnaire    Fear of Current or Ex-Partner: No    Emotionally Abused: No    Physically Abused: No    Sexually Abused: No   Family History  Problem Relation Age of Onset   COPD Mother    Stroke Father    Lung cancer Maternal Aunt    Diabetes Maternal Uncle    Brain cancer Maternal Uncle    Lung cancer Maternal Uncle    Lung cancer Maternal Uncle    Cancer Paternal Grandmother    Diabetes Paternal Grandfather    Colon cancer Neg Hx    Esophageal cancer Neg Hx    Stomach cancer Neg Hx    Pancreatic cancer Neg Hx    Colon polyps Neg Hx    Rectal cancer Neg Hx    There were no vitals taken for this visit.  Wt Readings from Last 3 Encounters:  05/26/23 77 kg (169 lb 12.8 oz)  05/15/23 78.2 kg (172 lb 6.4 oz)  04/23/23 79.6 kg (175 lb 6.4 oz)   PHYSICAL EXAM: General:  NAD. No resp difficulty HEENT: Normal Neck: Supple. JVP 8-9. Carotids 2+ bilat; no bruits. No lymphadenopathy or thryomegaly appreciated. Cor: PMI nondisplaced. Regular rate & rhythm. No rubs, gallops or murmurs. Lungs: Clear Abdomen: Soft, nontender, nondistended. No hepatosplenomegaly. No bruits or masses. Good bowel sounds. Extremities: No cyanosis, clubbing, rash, edema Neuro: Alert & oriented x 3, cranial nerves grossly intact. Moves all 4 extremities w/o difficulty. Affect pleasant.  ECG (personally reviewed): A sensed, V paced; 70 bpm  Device interrogation (personally reviewed): 97% BiV pacing, <1 % A pacing, no AT/AF, CorVue stable   ReDs: 34%  ASSESSMENT & PLAN:   1. Chronic Systolic HF due to  NICM - Echo (11/22): EF 20-25%. LV dilated (6.5cm) with severe central MR. Global HK with ? AK of anteroseptum  -  RHC/LHC (11/22) with minimal obstructive CAD, low filling pressure, normal output and severe NICM - cMRI (11/22): LVEF 14%, diffuse HK, mild/mod RV dilation w/ severe systolic dysfunction, RVEF 15%, small nonspecific basal inferoseptal RV insertion site LGE, mild elevation in ECV, not suggestive of cardiac amyloidosis. - Echo (3/23): EF 25-30% LV markedly dilated. Severe dyssynchrony. RV normal. Mild to moderate MR  - Suspected LBBB CMP (+/- PVC CM), now s/p CRT-D 6/23 - Echo (10/23): EF 30-35%, RV ok - CPX (4/24) showed mild HF limitation, slope 37 - Bedside echo today 03/07/23 EF 30% - NYHA II-early III today, volume looks stable on exam and by device interrogation, ReDs 34% - Has been off all GDMT x 6 months.  - Continue Entresto 24/26 mg bid. - Continue Coreg 6.25 mg bid. - Continue Farxiga 10 - Add spiro - Labs today. - Plan formal echo in 3 months, after back on GDMT consistently.  2. Frequent PVCs - Previously on amiodarone, now stopped.  - None on ECG today. Appropriate BiV pacing on ICD - Sleep study normal - Zio 7/24 4.8% PVCs   3. Severe mitral regurgitation - Functional. - Mild to moderate MR on echo 3/23. - Now trivial MR on echo 10/23   4. COPD - Quit smoking 2009. - Stable, no change.   5. Pre-Diabetes - A1C 6.0 - Continue Farxiga   6. H/o prostate CA - s/p radioactive seeds   7. Snoring - Home sleep study (12/22) AHI 0.6/hr  8. Pre-Operative Clearance - OK for surgery from cardiac standpoint  Ruben Meres, MD  10:44 PM

## 2023-06-24 ENCOUNTER — Encounter (HOSPITAL_COMMUNITY): Payer: Self-pay | Admitting: Internal Medicine

## 2023-06-24 ENCOUNTER — Ambulatory Visit (HOSPITAL_COMMUNITY)
Admission: RE | Admit: 2023-06-24 | Discharge: 2023-06-24 | Discharge status: Home / Self Care | Payer: Commercial Managed Care - HMO | Source: Ambulatory Visit | Attending: Internal Medicine | Admitting: Internal Medicine

## 2023-06-24 ENCOUNTER — Ambulatory Visit (HOSPITAL_COMMUNITY)
Admission: RE | Admit: 2023-06-24 | Discharge: 2023-06-24 | Disposition: A | Discharge status: Home / Self Care | Payer: Commercial Managed Care - HMO | Source: Ambulatory Visit | Attending: Internal Medicine | Admitting: Internal Medicine

## 2023-06-24 VITALS — BP 94/60 | HR 73 | Wt 163.0 lb

## 2023-06-24 DIAGNOSIS — Z923 Personal history of irradiation: Secondary | ICD-10-CM | POA: Insufficient documentation

## 2023-06-24 DIAGNOSIS — I452 Bifascicular block: Secondary | ICD-10-CM | POA: Diagnosis not present

## 2023-06-24 DIAGNOSIS — R7303 Prediabetes: Secondary | ICD-10-CM | POA: Insufficient documentation

## 2023-06-24 DIAGNOSIS — I2510 Atherosclerotic heart disease of native coronary artery without angina pectoris: Secondary | ICD-10-CM | POA: Insufficient documentation

## 2023-06-24 DIAGNOSIS — Z79899 Other long term (current) drug therapy: Secondary | ICD-10-CM | POA: Insufficient documentation

## 2023-06-24 DIAGNOSIS — I428 Other cardiomyopathies: Secondary | ICD-10-CM | POA: Diagnosis not present

## 2023-06-24 DIAGNOSIS — R0683 Snoring: Secondary | ICD-10-CM | POA: Insufficient documentation

## 2023-06-24 DIAGNOSIS — I5022 Chronic systolic (congestive) heart failure: Secondary | ICD-10-CM | POA: Diagnosis not present

## 2023-06-24 DIAGNOSIS — I493 Ventricular premature depolarization: Secondary | ICD-10-CM

## 2023-06-24 DIAGNOSIS — Z87891 Personal history of nicotine dependence: Secondary | ICD-10-CM | POA: Insufficient documentation

## 2023-06-24 DIAGNOSIS — K219 Gastro-esophageal reflux disease without esophagitis: Secondary | ICD-10-CM | POA: Diagnosis not present

## 2023-06-24 DIAGNOSIS — I340 Nonrheumatic mitral (valve) insufficiency: Secondary | ICD-10-CM

## 2023-06-24 DIAGNOSIS — I110 Hypertensive heart disease with heart failure: Secondary | ICD-10-CM | POA: Diagnosis present

## 2023-06-24 DIAGNOSIS — Z8546 Personal history of malignant neoplasm of prostate: Secondary | ICD-10-CM | POA: Diagnosis not present

## 2023-06-24 DIAGNOSIS — J439 Emphysema, unspecified: Secondary | ICD-10-CM | POA: Diagnosis not present

## 2023-06-24 DIAGNOSIS — I080 Rheumatic disorders of both mitral and aortic valves: Secondary | ICD-10-CM

## 2023-06-24 DIAGNOSIS — I517 Cardiomegaly: Secondary | ICD-10-CM

## 2023-06-24 DIAGNOSIS — Z01810 Encounter for preprocedural cardiovascular examination: Secondary | ICD-10-CM | POA: Diagnosis not present

## 2023-06-24 LAB — COMPREHENSIVE METABOLIC PANEL
ALT: 18 U/L (ref 0–44)
AST: 20 U/L (ref 15–41)
Albumin: 4.1 g/dL (ref 3.5–5.0)
Alkaline Phosphatase: 46 U/L (ref 38–126)
Anion gap: 8 (ref 5–15)
BUN: 9 mg/dL (ref 6–20)
CO2: 27 mmol/L (ref 22–32)
Calcium: 9.5 mg/dL (ref 8.9–10.3)
Chloride: 103 mmol/L (ref 98–111)
Creatinine, Ser: 0.96 mg/dL (ref 0.61–1.24)
GFR, Estimated: >60 mL/min (ref >60)
Glucose, Bld: 80 mg/dL (ref 70–99)
Potassium: 4.4 mmol/L (ref 3.5–5.1)
Sodium: 138 mmol/L (ref 135–145)
Total Bilirubin: 0.6 mg/dL (ref 0.3–1.2)
Total Protein: 6.4 g/dL — ABNORMAL LOW (ref 6.5–8.1)

## 2023-06-24 LAB — CBC
HCT: 49.6 % (ref 39.0–52.0)
Hemoglobin: 16.1 g/dL (ref 13.0–17.0)
MCH: 29.2 pg (ref 26.0–34.0)
MCHC: 32.5 g/dL (ref 30.0–36.0)
MCV: 90 fL (ref 80.0–100.0)
Platelets: 289 10*3/uL (ref 150–400)
RBC: 5.51 MIL/uL (ref 4.22–5.81)
RDW: 14.6 % (ref 11.5–15.5)
WBC: 9.2 10*3/uL (ref 4.0–10.5)
nRBC: 0 % (ref 0.0–0.2)

## 2023-06-24 LAB — ECHOCARDIOGRAM COMPLETE
Area-P 1/2: 4.06 cm2
Calc EF: 38.2 %
MV M vel: 4.17 m/s
MV Peak grad: 69.6 mmHg
Radius: 0.5 cm
S' Lateral: 4.6 cm
Single Plane A2C EF: 38.3 %
Single Plane A4C EF: 31.7 %

## 2023-06-24 LAB — TSH: TSH: 1.503 u[IU]/mL (ref 0.350–4.500)

## 2023-06-24 LAB — BRAIN NATRIURETIC PEPTIDE: B Natriuretic Peptide: 60.1 pg/mL (ref 0.0–100.0)

## 2023-06-24 LAB — T4, FREE: Free T4: 0.93 ng/dL (ref 0.61–1.12)

## 2023-06-24 NOTE — Progress Notes (Signed)
H&V Care Navigation CSW Progress Note  Clinical Social Worker met with patient to complete SDoH screen.  Patient is participating in a Managed Medicaid Plan:  No  CSW met with patient to complete SDoH screening. Patient identified financial needs with medical costs. Patient does not appear to be eligible for medicaid. CSW discussed options and encouraged patient to reach out to financial counseling for further options regarding medical bills. Lasandra Beech, LCSW, CCSW-MCS [phone]   SDOH Screenings   Food Insecurity: No Food Insecurity (06/24/2023)  Housing: Low Risk  (06/24/2023)  Transportation Needs: No Transportation Needs (06/24/2023)  Utilities: Not At Risk (06/24/2023)  Alcohol Screen: Low Risk  (07/27/2021)  Depression (PHQ2-9): Medium Risk (05/26/2023)  Financial Resource Strain: High Risk (06/24/2023)  Physical Activity: Inactive (07/27/2021)  Social Connections: Socially Isolated (07/27/2021)  Stress: No Stress Concern Present (07/27/2021)  Tobacco Use: Medium Risk (06/24/2023)  Health Literacy: Adequate Health Literacy (06/24/2023)

## 2023-06-24 NOTE — Patient Instructions (Signed)
Great to see you today!!!  Continue current medications  Labs done today, your results will be available in MyChart, we will contact you for abnormal readings.  Your physician recommends that you schedule a follow-up appointment in: 3 months  Do the following things EVERYDAY: Weigh yourself in the morning before breakfast. Write it down and keep it in a log. Take your medicines as prescribed Eat low salt foods--Limit salt (sodium) to 2000 mg per day.  Stay as active as you can everyday Limit all fluids for the day to less than 2 liters  If you have any questions or concerns before your next appointment please send [place] a message through [place] or call our office at [phone].    TO LEAVE A MESSAGE FOR THE NURSE SELECT OPTION 2, PLEASE LEAVE A MESSAGE INCLUDING: YOUR NAME DATE OF BIRTH CALL BACK NUMBER REASON FOR CALL**this is important as we prioritize the call backs  YOU WILL RECEIVE A CALL BACK THE SAME DAY AS LONG AS YOU CALL BEFORE 4:00 PM  At the [facility], you and your health needs are our priority. As part of our continuing mission to provide you with exceptional heart care, we have created designated Provider Care Teams. These Care Teams include your primary Cardiologist (physician) and Advanced Practice Providers (APPs- Physician Assistants and Nurse Practitioners) who all work together to provide you with the care you need, when you need it.   You may see any of the following providers on your designated Care Team at your next follow up: Dr Arvilla Meres Dr Marca Ancona Dr. Marcos Eke, NP Robbie Lis, [place] [facility] [place], [place] Brynda Peon, NP Karle Plumber, PharmD   Please be sure to bring in all your medications bottles to every appointment.    Thank you for choosing [facility]

## 2023-06-25 LAB — T3, FREE: T3, Free: 3 pg/mL (ref 2.0–4.4)

## 2023-07-03 ENCOUNTER — Encounter: Payer: Self-pay | Admitting: Internal Medicine

## 2023-07-08 ENCOUNTER — Ambulatory Visit (INDEPENDENT_AMBULATORY_CARE_PROVIDER_SITE_OTHER): Payer: Commercial Managed Care - HMO

## 2023-07-08 DIAGNOSIS — I5022 Chronic systolic (congestive) heart failure: Secondary | ICD-10-CM | POA: Diagnosis not present

## 2023-07-09 LAB — CUP PACEART REMOTE DEVICE CHECK
Battery Remaining Longevity: 76 mo
Battery Remaining Percentage: 83 %
Battery Voltage: 3.01 V
Brady Statistic AP VP Percent: 1.6 %
Brady Statistic AP VS Percent: 1 %
Brady Statistic AS VP Percent: 93 %
Brady Statistic AS VS Percent: 2.3 %
Brady Statistic RA Percent Paced: 1 %
Date Time Interrogation Session: 20240924040016
HighPow Impedance: 62 Ohm
HighPow Impedance: 62 Ohm
Implantable Lead Connection Status: 753985
Implantable Lead Connection Status: 753985
Implantable Lead Connection Status: 753985
Implantable Lead Implant Date: 20230626
Implantable Lead Implant Date: 20230626
Implantable Lead Implant Date: 20230626
Implantable Lead Location: 753858
Implantable Lead Location: 753859
Implantable Lead Location: 753860
Implantable Lead Model: 7122
Implantable Pulse Generator Implant Date: 20230626
Lead Channel Impedance Value: 490 Ohm
Lead Channel Impedance Value: 540 Ohm
Lead Channel Impedance Value: 600 Ohm
Lead Channel Pacing Threshold Amplitude: 0.5 V
Lead Channel Pacing Threshold Amplitude: 1.25 V
Lead Channel Pacing Threshold Amplitude: 1.375 V
Lead Channel Pacing Threshold Pulse Width: 0.5 ms
Lead Channel Pacing Threshold Pulse Width: 0.5 ms
Lead Channel Pacing Threshold Pulse Width: 0.5 ms
Lead Channel Sensing Intrinsic Amplitude: 11.6 mV
Lead Channel Sensing Intrinsic Amplitude: 2.3 mV
Lead Channel Setting Pacing Amplitude: 2 V
Lead Channel Setting Pacing Amplitude: 2 V
Lead Channel Setting Pacing Amplitude: 2.25 V
Lead Channel Setting Pacing Pulse Width: 0.5 ms
Lead Channel Setting Pacing Pulse Width: 0.5 ms
Lead Channel Setting Sensing Sensitivity: 0.5 mV
Pulse Gen Serial Number: 8949760

## 2023-07-25 NOTE — Progress Notes (Signed)
Remote ICD transmission.   

## 2023-08-26 ENCOUNTER — Ambulatory Visit (INDEPENDENT_AMBULATORY_CARE_PROVIDER_SITE_OTHER): Payer: Managed Care, Other (non HMO) | Admitting: Internal Medicine

## 2023-08-26 ENCOUNTER — Encounter: Payer: Self-pay | Admitting: Internal Medicine

## 2023-08-26 VITALS — BP 93/58 | HR 77 | Temp 98.5°F | Resp 20 | Ht 68.0 in | Wt 165.1 lb

## 2023-08-26 DIAGNOSIS — I5022 Chronic systolic (congestive) heart failure: Secondary | ICD-10-CM

## 2023-08-26 DIAGNOSIS — B001 Herpesviral vesicular dermatitis: Secondary | ICD-10-CM | POA: Diagnosis not present

## 2023-08-26 MED ORDER — VALACYCLOVIR HCL 500 MG PO TABS
500.0000 mg | ORAL_TABLET | Freq: Two times a day (BID) | ORAL | 2 refills | Status: DC | PRN
Start: 2023-08-26 — End: 2023-11-26

## 2023-08-26 NOTE — Assessment & Plan Note (Signed)
Valtrex refilled today 

## 2023-08-26 NOTE — Progress Notes (Signed)
Established Patient Office Visit  Subjective   Patient ID: Ruben Duffy, male    DOB: 08-17-1966  Age: 57 y.o. MRN: [id number]  Chief Complaint  Patient presents with   Medical Management of Chronic Issues   Diarrhea   Ruben Duffy returns to care today for routine follow-up.  He was last evaluated by me on 8/12 as a new patient presenting to establish care.  No medication changes were made at that time and 8-month follow-up was arranged.  In the interim he has been evaluated by cardiology for follow-up in the advanced heart failure clinic.  There have otherwise been no acute interval events.  Ruben Duffy reports feeling fairly well today.  He endorses intermittent diarrhea for the last 8-9 days.  He is otherwise asymptomatic.  His acute concern is requesting a refill of Valtrex, which he takes as needed for management of recurrent cold sores.  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 Jude Quadra Assura CRT-D   Arthritis    Bilateral inguinal hernia    a.) s/p BILATERAL repair 11/19/2022; b.) recurrent hernia on LEFT   CAD (coronary artery disease) 09/10/2021   a.) R/LHC 09/10/2021: 30% pLCx - med mgmt.   Cardiomegaly    CHF (congestive heart failure) (HCC)    a.) TTE 09/07/2021: EF 25-30%, diff inf HK, mod LV dil, mod LAE, mod-sev MR; b.) R/LHC 09/10/2021: EF 25%, mPA 13, PCWP 5, CO 6.2, CI 3.1; c.) cMRI 09/11/2021: EF 14%, sev LV dil, diff HK; d.) TTE 01/11/2022: EF 25-30%, glob HK, mod LV dil, mod LAE, mild MR, G2DD; e.) CRT-D implanted 04/08/2022; f.) TTE 08/08/2022: EF 30-35%, glob HK, triv MR, G1DD   DDD (degenerative disc disease), lumbar    Emphysema of lung (HCC)    Erectile dysfunction    a.) on PDE5i (sildenafil) PRN   GERD (gastroesophageal reflux disease)    Hemorrhoids    Hepatitis C virus infection cured after antiviral drug therapy    History of substance abuse (HCC)    a.) THC + crack cocaine    HLD (hyperlipidemia)    Hypertension    Hypospadias, balanic    LBBB (left bundle branch block) 04/26/2021   a.) noted on preop ECG 04/26/2021   Long term current use of amiodarone    Lower urinary tract symptoms (LUTS)    NICM (nonischemic cardiomyopathy) (HCC)    a.) TTE 09/07/2021: EF 25-30%; b.) R/LHC 09/10/2021: mPA 13, PCWP 5, CO 6.2, CI 3.1; c.) cMRI 09/11/2021: EF 14%, diff HK, RV insertion site LGE; d.) TTE 01/11/2022: EF 25-30% e.) CRT-D implanted 04/08/2022; f.) TTE 08/08/2022: EF 30-35%   Pre-diabetes    Prostate cancer (HCC) 01/08/2021   a.) Gleason 3+4, T2a; b.) s/p brachytherapy 06/05/2021   Umbilical hernia    a.) s/p repair 11/19/2022   Past Surgical History:  Procedure Laterality Date   BIOPSY PROSTATE     BIV ICD INSERTION CRT-D N/A 04/08/2022   Procedure: BIV ICD INSERTION CRT-D;  Surgeon: Duke Salvia, MD;  Location[facility] INVASIVE CV LAB;  Service: Cardiovascular;  Laterality: N/A;   CIRCUMCISION N/A 06/05/2021   Procedure: CIRCUMCISION ADULT;  Surgeon: Bjorn Pippin, MD;  Location: [facility];  Service: Urology;  Laterality: N/A;   COLONOSCOPY W/ POLYPECTOMY     HEMORRHOID SURGERY     HERNIA REPAIR     Had 3 fixed, had re-repair the left groin hernia.   HYPOSPADIAS  CORRECTION     INSERTION OF MESH  11/19/2022   Procedure: INSERTION OF MESH;  Surgeon: Leafy Ro, MD;  Location: [facility];  Service: General;;   RADIOACTIVE SEED IMPLANT N/A 06/05/2021   Procedure: RADIOACTIVE SEED IMPLANT/BRACHYTHERAPY IMPLANT;  Surgeon: Bjorn Pippin, MD;  Location: [facility];  Service: Urology;  Laterality: N/A;   RIGHT/LEFT HEART CATH AND CORONARY ANGIOGRAPHY N/A 09/10/2021   Procedure: RIGHT/LEFT HEART CATH AND CORONARY ANGIOGRAPHY;  Surgeon: Dolores Patty, MD;  Location: [facility] INVASIVE CV LAB;  Service: Cardiovascular;  Laterality: N/A;   SPACE OAR INSTILLATION N/A 06/05/2021   Procedure: SPACE OAR INSTILLATION;  Surgeon: Bjorn Pippin, MD;   Location: [facility];  Service: Urology;  Laterality: N/A;   UMBILICAL HERNIA REPAIR N/A 11/19/2022   Procedure: HERNIA REPAIR UMBILICAL ADULT;  Surgeon: Leafy Ro, MD;  Location: [facility];  Service: General;  Laterality: N/A;   Social History   Tobacco Use   Smoking status: Former    Current packs/day: 0.00    Average packs/day: 1.8 packs/day for 30.0 years (52.5 ttl pk-yrs)    Types: Cigarettes    Start date: 64    Quit date: 09/13/2008    Years since quitting: 14.9    Passive exposure: Past   Smokeless tobacco: Never  Vaping Use   Vaping status: Never Used  Substance Use Topics   Alcohol use: Not Currently   Drug use: Not Currently    Types: "Crack" cocaine, Marijuana   Family History  Problem Relation Age of Onset   COPD Mother    Stroke Father    Lung cancer Maternal Aunt    Diabetes Maternal Uncle    Brain cancer Maternal Uncle    Lung cancer Maternal Uncle    Lung cancer Maternal Uncle    Cancer Paternal Grandmother    Diabetes Paternal Grandfather    Colon cancer Neg Hx    Esophageal cancer Neg Hx    Stomach cancer Neg Hx    Pancreatic cancer Neg Hx    Colon polyps Neg Hx    Rectal cancer Neg Hx    No Known Allergies  Review of Systems  Gastrointestinal:  Positive for diarrhea.  All other systems reviewed and are negative.    Objective:     BP (!) 93/58   Pulse 77   Temp 98.5 F (36.9 C) (Oral)   Resp 20   Ht 5\' 8"  (1.727 m)   Wt 165 lb 2 oz (74.9 kg)   SpO2 97%   BMI 25.11 kg/m  BP Readings from Last 3 Encounters:  08/26/23 (!) 93/58  06/24/23 94/60  05/26/23 109/72   Physical Exam Vitals reviewed.  Constitutional:      General: He is not in acute distress.    Appearance: Normal appearance. He is not ill-appearing.  HENT:     Head: Normocephalic and atraumatic.     Right Ear: External ear normal.     Left Ear: External ear normal.     Nose: Nose normal. No congestion or rhinorrhea.     Mouth/Throat:     Mouth:  Mucous membranes are moist.     Pharynx: Oropharynx is clear.  Eyes:     General: No scleral icterus.    Extraocular Movements: Extraocular movements intact.     Conjunctiva/sclera: Conjunctivae normal.     Pupils: Pupils are equal, round, and reactive to light.  Cardiovascular:     Rate and Rhythm: Normal rate and regular  rhythm.     Pulses: Normal pulses.     Heart sounds: Normal heart sounds. No murmur heard. Pulmonary:     Effort: Pulmonary effort is normal.     Breath sounds: Normal breath sounds. No wheezing, rhonchi or rales.  Abdominal:     General: Abdomen is flat. Bowel sounds are normal. There is no distension.     Palpations: Abdomen is soft.     Tenderness: There is no abdominal tenderness.  Musculoskeletal:        General: No swelling or deformity. Normal range of motion.     Cervical back: Normal range of motion.  Skin:    General: Skin is warm and dry.     Capillary Refill: Capillary refill takes less than 2 seconds.  Neurological:     General: No focal deficit present.     Mental Status: He is alert and oriented to person, place, and time.     Motor: No weakness.  Psychiatric:        Mood and Affect: Mood normal.        Behavior: Behavior normal.        Thought Content: Thought content normal.   Last CBC Lab Results  Component Value Date   WBC 9.2 06/24/2023   HGB 16.1 06/24/2023   HCT 49.6 06/24/2023   MCV 90.0 06/24/2023   MCH 29.2 06/24/2023   RDW 14.6 06/24/2023   PLT 289 06/24/2023   Last metabolic panel Lab Results  Component Value Date   GLUCOSE 80 06/24/2023   NA 138 06/24/2023   K 4.4 06/24/2023   CL 103 06/24/2023   CO2 27 06/24/2023   BUN 9 06/24/2023   CREATININE 0.96 06/24/2023   GFRNONAA >60 06/24/2023   CALCIUM 9.5 06/24/2023   PROT 6.4 (L) 06/24/2023   ALBUMIN 4.1 06/24/2023   LABGLOB 2.6 03/29/2022   AGRATIO 1.7 03/29/2022   BILITOT 0.6 06/24/2023   ALKPHOS 46 06/24/2023   AST 20 06/24/2023   ALT 18 06/24/2023    ANIONGAP 8 06/24/2023   Last lipids Lab Results  Component Value Date   CHOL 175 09/10/2021   HDL 52 09/10/2021   LDLCALC 104 (H) 09/10/2021   TRIG 96 09/10/2021   CHOLHDL 3.4 09/10/2021   Last hemoglobin A1c Lab Results  Component Value Date   HGBA1C 6.0 (H) 09/08/2021   Last thyroid functions Lab Results  Component Value Date   TSH 1.503 06/24/2023     Assessment & Plan:   Problem List Items Addressed This Visit       Chronic systolic heart failure (HCC)    Recently seen by cardiology for follow-up.  EF 30-35% on TTE from September.  He remains euvolemic on exam and asymptomatic.  Currently prescribed Entresto, spironolactone, carvedilol, and [place].  No medication changes are indicated today.      Recurrent cold sores - Primary    Valtrex refilled today      Return in about 3 months (around 11/26/2023).   Billie Lade, MD

## 2023-08-26 NOTE — Assessment & Plan Note (Signed)
Recently seen by cardiology for follow-up.  EF 30-35% on TTE from September.  He remains euvolemic on exam and asymptomatic.  Currently prescribed Entresto, spironolactone, carvedilol, and [place].  No medication changes are indicated today.

## 2023-08-26 NOTE — Patient Instructions (Signed)
It was a pleasure to see you today.  Thank you for giving [place] the opportunity to be involved in your care.  Below is a brief recap of your visit and next steps.  We will plan to see you again in 3 months.  Summary No medication changes today Valtrex refilled Follow up in 3 months

## 2023-09-22 NOTE — Progress Notes (Signed)
[facility] Note   Primary Care: Billie Lade, MD EP: Dr. Graciela Husbands HF Cardiologist: Dr. 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  He was seen by Dr Allyson Sabal 04/2021 for preop eval before radioactive seed implant and circumcision. He had LBBB of unknown duration, BP well-controlled. No further workup pre-procedure.  Admitted 11/22 for a/c systolic HF. Echo EF 20-25%. LV dilated (6.5cm) with severe central MR. Global HK with ? AK of anteroseptum.  Diuresed with IV lasix. Initiated on GDMT with digoxin, spironolactone, Entresto and Farxiga. Frequent PVCs, started on amio w/ reduction in PVC burden. R/LHC showed minimal nonobstructive CAD, cMRI showed LVEF 14%, diffuse HK, mild/mod RV dilation w/ severe systolic dysfunction, RVEF 15%, small nonspecific basal inferoseptal RV insertion site LGE, mild elevation in ECV, not suggestive of cardiac amyloidosis. 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 LifeVest placed  Home sleep study 12/22 AHI 0.6/hr  Echo 01/11/22: EF 25-30% LV markedly dilated. Severe dyssynchrony. RV normal. Mild to moderate MR. Referred to EP for CRT-D.   Underwent CRT-D implant 6/23.   Follow up 10/23, still with NYHA II-III symptoms despite CRT-D, volume ok. Echo repeated, considering CPX if no better. Amio decreased to 100 daily.  Echo (10/23): EF 30-35%, moderate LV dysfunction with global HK, grade I DD, RV ok  S/p laparoscopic bilateral inguinal hernia repair and open ventral hernia repair (2/24). Follow up 2/24, felt like he had more energy after CRT-D. NYHA II and volume OK. CPX arranged which showed mild HF limitation.  We saw him in 5/24 for pre-op clearance for hernia surgery. 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 Bedside echo 30%. He  was cleared. GDMT restarted.   Had uncomplicated hernia surgery 03/13/23. Has been following with HF PharmD for med titration .  Today he returns for HF follow up. Feels pretty good. Driving his forklift. Mild DOE. No CP. No edema, orthopnea or PND. Occasional PVCs. Has lost 10 pounds due to reduced appetite   ICD interrogation: 94% AS-VP PVC 2.8% No VO/AF. Activity level 4hr/day Fluid up now ok Personally reviewed   Cardiac Studies:  - CPX (4/24):  Pre-Exercise PFTs: FVC 3.64 (81%)      FEV1 2.77 (79%)        FEV1/FVC 76 (97%)        MVV 99 (72%)   Peak VO2: 28.4 (87% predicted peak VO2)  VE/VCO2 slope:  37  OUES: 2.58  Peak RER: 1.12  Ventilatory Threshold: 19.8 (57% predicted or measured peak VO2)  Peak RR 45  Peak Ventilation:  100.9  VE/MVV:  102%  PETCO2 at peak:  30   - Echo (10/23): EF 30-35%, moderate LV dysfunction with global HK, grade I DD, RV ok  - Echo (3/23): EF 25-30%, LV markedly dilated, severe dyssynchrony, RV ok, mild to moderate MR.  - Cardiac MRI (11/22):  LVEF 14%, diffuse HK, mild/mod RV dilation w/ severe systolic dysfunction, RVEF 15%, small nonspecific basal inferoseptal RV insertion site LGE, mild elevation in ECV, not suggestive of cardiac amyloidosis.   - Echo (11/22): EF 20-25%. LV dilated (6.5cm) with severe central MR. Global HK with ? AK of anteroseptum   - RHC/LHC (11/22) with minimal obstructive CAD, low filling pressure, normal output and severe  NICM   ROS: All systems reviewed and negative except as per HPI.   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 Jude Quadra Assura CRT-D   Arthritis    Bilateral inguinal hernia    a.) s/p BILATERAL repair 11/19/2022; b.) recurrent hernia on LEFT   CAD (coronary artery disease) 09/10/2021   a.) R/LHC 09/10/2021: 30% pLCx - med mgmt.   Cardiomegaly    CHF (congestive heart failure) (HCC)    a.) TTE 09/07/2021: EF 25-30%,  diff inf HK, mod LV dil, mod LAE, mod-sev MR; b.) R/LHC 09/10/2021: EF 25%, mPA 13, PCWP 5, CO 6.2, CI 3.1; c.) cMRI 09/11/2021: EF 14%, sev LV dil, diff HK; d.) TTE 01/11/2022: EF 25-30%, glob HK, mod LV dil, mod LAE, mild MR, G2DD; e.) CRT-D implanted 04/08/2022; f.) TTE 08/08/2022: EF 30-35%, glob HK, triv MR, G1DD   DDD (degenerative disc disease), lumbar    Emphysema of lung (HCC)    Erectile dysfunction    a.) on PDE5i (sildenafil) PRN   GERD (gastroesophageal reflux disease)    Hemorrhoids    Hepatitis C virus infection cured after antiviral drug therapy    History of substance abuse (HCC)    a.) THC + crack cocaine   HLD (hyperlipidemia)    Hypertension    Hypospadias, balanic    LBBB (left bundle branch block) 04/26/2021   a.) noted on preop ECG 04/26/2021   Long term current use of amiodarone    Lower urinary tract symptoms (LUTS)    NICM (nonischemic cardiomyopathy) (HCC)    a.) TTE 09/07/2021: EF 25-30%; b.) R/LHC 09/10/2021: mPA 13, PCWP 5, CO 6.2, CI 3.1; c.) cMRI 09/11/2021: EF 14%, diff HK, RV insertion site LGE; d.) TTE 01/11/2022: EF 25-30% e.) CRT-D implanted 04/08/2022; f.) TTE 08/08/2022: EF 30-35%   Pre-diabetes    Prostate cancer (HCC) 01/08/2021   a.) Gleason 3+4, T2a; b.) s/p brachytherapy 06/05/2021   Umbilical hernia    a.) s/p repair 11/19/2022    Current Outpatient Medications  Medication Sig Dispense Refill   carvedilol (COREG) 6.25 MG tablet Take 1 tablet (6.25 mg total) by mouth 2 (two) times daily with a meal. 180 tablet 3   FARXIGA 10 MG TABS tablet Take 1 tablet (10 mg total) by mouth daily. 30 tablet 11   folic acid (FOLVITE) 1 MG tablet Take 1 mg by mouth daily.     methotrexate (RHEUMATREX) 2.5 MG tablet Take 15 mg by mouth once a week.     Multiple Vitamin (MULTIVITAMIN ADULT PO) Take by mouth daily.     rosuvastatin (CRESTOR) 10 MG tablet Take 1 tablet (10 mg total) by mouth daily. 90 tablet 3   sacubitril-valsartan (ENTRESTO) 24-26 MG Take 1  tablet by mouth 2 (two) times daily. 180 tablet 3   spironolactone (ALDACTONE) 25 MG tablet Take 1 tablet (25 mg total) by mouth daily. 90 tablet 3   valACYclovir (VALTREX) 500 MG tablet Take 1 tablet (500 mg total) by mouth 2 (two) times daily as needed. 20 tablet 2   No current facility-administered medications for this visit.   No Known Allergies  Social History   Socioeconomic History   Marital status: Single    Spouse name: Not on file   Number of children: 1   Years of education: Not on file   Highest education level: GED or equivalent  Occupational History   Occupation: [employer]  Comment: Estate agent  Tobacco Use   Smoking status: Former    Current packs/day: 0.00    Average packs/day: 1.8 packs/day for 30.0 years (52.5 ttl pk-yrs)    Types: Cigarettes    Start date: 5    Quit date: 09/13/2008    Years since quitting: 15.0    Passive exposure: Past   Smokeless tobacco: Never  Vaping Use   Vaping status: Never Used  Substance and Sexual Activity   Alcohol use: Not Currently   Drug use: Not Currently    Types: "Crack" cocaine, Marijuana   Sexual activity: Not Currently  Other Topics Concern   Not on file  Social History Narrative   Lives with mother   Social Determinants of Health   Financial Resource Strain: Low Risk  (08/22/2023)   Overall Financial Resource Strain (CARDIA)    Difficulty of Paying Living Expenses: Not hard at all  Recent Concern: Financial Resource Strain - High Risk (06/24/2023)   Overall Financial Resource Strain (CARDIA)    Difficulty of Paying Living Expenses: Hard  Food Insecurity: No Food Insecurity (08/22/2023)   Hunger Vital Sign    Worried About Running Out of Food in the Last Year: Never true    Ran Out of Food in the Last Year: Never true  Transportation Needs: No Transportation Needs (08/22/2023)   PRAPARE - Administrator, Civil Service (Medical): No    Lack of Transportation (Non-Medical): No   Physical Activity: Unknown (08/22/2023)   Exercise Vital Sign    Days of Exercise per Week: Patient declined    Minutes of Exercise per Session: Not on file  Stress: No Stress Concern Present (08/22/2023)   [employer] of Occupational Health - Occupational Stress Questionnaire    Feeling of Stress : Not at all  Social Connections: Unknown (08/22/2023)   Social Connection and Isolation Panel [NHANES]    Frequency of Communication with Friends and Family: Patient declined    Frequency of Social Gatherings with Friends and Family: Patient declined    Attends Religious Services: Patient declined    Database administrator or Organizations: No    Attends Engineer, structural: Not on file    Marital Status: Patient declined  Intimate Partner Violence: Not At Risk (07/27/2021)   Humiliation, Afraid, Rape, and Kick questionnaire    Fear of Current or Ex-Partner: No    Emotionally Abused: No    Physically Abused: No    Sexually Abused: No   Family History  Problem Relation Age of Onset   COPD Mother    Stroke Father    Lung cancer Maternal Aunt    Diabetes Maternal Uncle    Brain cancer Maternal Uncle    Lung cancer Maternal Uncle    Lung cancer Maternal Uncle    Cancer Paternal Grandmother    Diabetes Paternal Grandfather    Colon cancer Neg Hx    Esophageal cancer Neg Hx    Stomach cancer Neg Hx    Pancreatic cancer Neg Hx    Colon polyps Neg Hx    Rectal cancer Neg Hx    There were no vitals taken for this visit.  Wt Readings from Last 3 Encounters:  08/26/23 74.9 kg (165 lb 2 oz)  06/24/23 73.9 kg (163 lb)  05/26/23 77 kg (169 lb 12.8 oz)   PHYSICAL EXAM: General:  Well appearing. No resp difficulty HEENT: normal Neck: supple. no JVD. Carotids 2+ bilat; no bruits. No lymphadenopathy or thryomegaly  appreciated. Cor: PMI nondisplaced. Regular rate & rhythm. No rubs, gallops or murmurs. Lungs: clear Abdomen: soft, nontender, nondistended. No  hepatosplenomegaly. No bruits or masses. Good bowel sounds. Extremities: no cyanosis, clubbing, rash, edema Neuro: alert & orientedx3, cranial nerves grossly intact. moves all 4 extremities w/o difficulty. Affect pleasant  ECG (personally reviewed): A sensed, V paced; 61 bpm No PVCs Personally reviewed  Device interrogation (personally reviewed): as per HPI  ASSESSMENT & PLAN:   1. Chronic Systolic HF due to NICM - Echo (11/22): EF 20-25%. LV dilated (6.5cm) with severe central MR. Global HK with ? AK of anteroseptum  - RHC/LHC (11/22) with minimal obstructive CAD, low filling pressure, normal output and severe NICM - cMRI (11/22): LVEF 14%, diffuse HK, mild/mod RV dilation w/ severe systolic dysfunction, RVEF 15%, small nonspecific basal inferoseptal RV insertion site LGE, mild elevation in ECV, not suggestive of cardiac amyloidosis. - Echo (3/23): EF 25-30% LV markedly dilated. Severe dyssynchrony. RV normal. Mild to moderate MR  - Suspected LBBB CMP (+/- PVC CM), now s/p CRT-D 6/23 - Echo (10/23): EF 30-35%, RV ok - CPX (4/24) showed mild HF limitation, slope 37 - Bedside echo 03/07/23 EF 30% - Echo today 06/24/23 EF 30-35% - Improved NYHA II - Continue Entresto 24/26 mg bid. - Continue Coreg 6.25 mg bid. - Continue Farxiga 10 - BP too low (and K too high) to add spiro  - Labs today.  2. Frequent PVCs - Previously on amiodarone, now stopped.  - None on ECG today. Appropriate BiV pacing on ICD - Sleep study normal - Zio 7/24 4.8% PVCs - On ICD today 2.8% PVCs   3. Severe functional mitral regurgitation - Mild to moderate MR on echo 3/23. - Now trivial MR on echo 10/23   4. COPD - Quit smoking 2009. - Stable, no change.   5. Pre-Diabetes - A1C 6.0 - Continue Farxiga   6. H/o prostate CA - s/p radioactive seeds   7. Snoring - Home sleep study (12/22) AHI 0.6/hr   Jacklynn Ganong, FNP  4:05 PM

## 2023-09-23 ENCOUNTER — Ambulatory Visit (HOSPITAL_COMMUNITY)
Admission: RE | Admit: 2023-09-23 | Discharge: 2023-09-23 | Disposition: A | Discharge status: Home / Self Care | Payer: Commercial Managed Care - HMO | Source: Ambulatory Visit | Attending: Family Medicine | Admitting: Family Medicine

## 2023-09-23 ENCOUNTER — Encounter (HOSPITAL_COMMUNITY): Payer: Self-pay

## 2023-09-23 VITALS — BP 102/60 | HR 74 | Wt 164.6 lb

## 2023-09-23 DIAGNOSIS — I493 Ventricular premature depolarization: Secondary | ICD-10-CM | POA: Insufficient documentation

## 2023-09-23 DIAGNOSIS — I5022 Chronic systolic (congestive) heart failure: Secondary | ICD-10-CM | POA: Insufficient documentation

## 2023-09-23 DIAGNOSIS — I2510 Atherosclerotic heart disease of native coronary artery without angina pectoris: Secondary | ICD-10-CM | POA: Diagnosis not present

## 2023-09-23 DIAGNOSIS — Z79899 Other long term (current) drug therapy: Secondary | ICD-10-CM | POA: Diagnosis not present

## 2023-09-23 DIAGNOSIS — Z8546 Personal history of malignant neoplasm of prostate: Secondary | ICD-10-CM | POA: Insufficient documentation

## 2023-09-23 DIAGNOSIS — J449 Chronic obstructive pulmonary disease, unspecified: Secondary | ICD-10-CM

## 2023-09-23 DIAGNOSIS — Z5986 Financial insecurity: Secondary | ICD-10-CM | POA: Diagnosis not present

## 2023-09-23 DIAGNOSIS — I340 Nonrheumatic mitral (valve) insufficiency: Secondary | ICD-10-CM | POA: Insufficient documentation

## 2023-09-23 DIAGNOSIS — I428 Other cardiomyopathies: Secondary | ICD-10-CM | POA: Insufficient documentation

## 2023-09-23 DIAGNOSIS — R0683 Snoring: Secondary | ICD-10-CM

## 2023-09-23 DIAGNOSIS — I110 Hypertensive heart disease with heart failure: Secondary | ICD-10-CM | POA: Diagnosis not present

## 2023-09-23 DIAGNOSIS — Z95810 Presence of automatic (implantable) cardiac defibrillator: Secondary | ICD-10-CM | POA: Diagnosis not present

## 2023-09-23 DIAGNOSIS — J439 Emphysema, unspecified: Secondary | ICD-10-CM | POA: Diagnosis not present

## 2023-09-23 DIAGNOSIS — Z87891 Personal history of nicotine dependence: Secondary | ICD-10-CM | POA: Diagnosis not present

## 2023-09-23 DIAGNOSIS — I447 Left bundle-branch block, unspecified: Secondary | ICD-10-CM | POA: Insufficient documentation

## 2023-09-23 DIAGNOSIS — Z923 Personal history of irradiation: Secondary | ICD-10-CM | POA: Diagnosis not present

## 2023-09-23 DIAGNOSIS — R7303 Prediabetes: Secondary | ICD-10-CM | POA: Diagnosis not present

## 2023-09-23 LAB — HEMOGLOBIN A1C
Hgb A1c MFr Bld: 5.8 % — ABNORMAL HIGH (ref 4.8–5.6)
Mean Plasma Glucose: 119.76 mg/dL

## 2023-09-23 LAB — COMPREHENSIVE METABOLIC PANEL
ALT: 17 U/L (ref 0–44)
AST: 20 U/L (ref 15–41)
Albumin: 3.6 g/dL (ref 3.5–5.0)
Alkaline Phosphatase: 47 U/L (ref 38–126)
Anion gap: 6 (ref 5–15)
BUN: 8 mg/dL (ref 6–20)
CO2: 27 mmol/L (ref 22–32)
Calcium: 9.3 mg/dL (ref 8.9–10.3)
Chloride: 106 mmol/L (ref 98–111)
Creatinine, Ser: 1.04 mg/dL (ref 0.61–1.24)
GFR, Estimated: >60 mL/min (ref >60)
Glucose, Bld: 97 mg/dL (ref 70–99)
Potassium: 4.3 mmol/L (ref 3.5–5.1)
Sodium: 139 mmol/L (ref 135–145)
Total Bilirubin: 1.1 mg/dL (ref <1.2)
Total Protein: 6.4 g/dL — ABNORMAL LOW (ref 6.5–8.1)

## 2023-09-23 LAB — LIPID PANEL
Cholesterol: 132 mg/dL (ref 0–200)
HDL: 57 mg/dL (ref >40)
LDL Cholesterol: 62 mg/dL (ref 0–99)
Total CHOL/HDL Ratio: 2.3 {ratio}
Triglycerides: 66 mg/dL (ref <150)
VLDL: 13 mg/dL (ref 0–40)

## 2023-09-23 LAB — MAGNESIUM: Magnesium: 2.1 mg/dL (ref 1.7–2.4)

## 2023-09-23 LAB — BRAIN NATRIURETIC PEPTIDE: B Natriuretic Peptide: 71.3 pg/mL (ref 0.0–100.0)

## 2023-09-23 LAB — TSH: TSH: 0.748 u[IU]/mL (ref 0.350–4.500)

## 2023-09-23 MED ORDER — FUROSEMIDE 20 MG PO TABS: 20.0000 mg | ORAL_TABLET | Freq: Every day | ORAL | 0 refills | Status: DC

## 2023-09-23 NOTE — Patient Instructions (Signed)
Take Lasix  20 mg as needed for weight gain of 3lb in 24 hours or 5lb in a week.  Labs done today, your results will be available in MyChart, we will contact you for abnormal readings.  Your physician recommends that you schedule a follow-up appointment in: 3 months   If you have any questions or concerns before your next appointment please send Ruben Duffy a message through [place] or call our office at [phone].    TO LEAVE A MESSAGE FOR THE NURSE SELECT OPTION 2, PLEASE LEAVE A MESSAGE INCLUDING: YOUR NAME DATE OF BIRTH CALL BACK NUMBER REASON FOR CALL**this is important as we prioritize the call backs  YOU WILL RECEIVE A CALL BACK THE SAME DAY AS LONG AS YOU CALL BEFORE 4:00 PM  At the [facility], you and your health needs are our priority. As part of our continuing mission to provide you with exceptional heart care, we have created designated Provider Care Teams. These Care Teams include your primary Cardiologist (physician) and Advanced Practice Providers (APPs- Physician Assistants and Nurse Practitioners) who all work together to provide you with the care you need, when you need it.   You may see any of the following providers on your designated Care Team at your next follow up: Dr Arvilla Meres Dr Marca Ancona Dr. Dorthula Nettles Dr. Clearnce Hasten Amy Filbert Schilder, NP Robbie Lis, [place] [facility] [place], [place] Brynda Peon, NP [place] Lee, NP Karle Plumber, PharmD   Please be sure to bring in all your medications bottles to every appointment.    Thank you for choosing [facility]

## 2023-10-07 ENCOUNTER — Ambulatory Visit (INDEPENDENT_AMBULATORY_CARE_PROVIDER_SITE_OTHER): Payer: 59

## 2023-10-07 DIAGNOSIS — I5022 Chronic systolic (congestive) heart failure: Secondary | ICD-10-CM | POA: Diagnosis not present

## 2023-10-07 DIAGNOSIS — I428 Other cardiomyopathies: Secondary | ICD-10-CM

## 2023-10-09 LAB — CUP PACEART REMOTE DEVICE CHECK
Battery Remaining Longevity: 73 mo
Battery Remaining Percentage: 80 %
Battery Voltage: 3.01 V
Brady Statistic AP VP Percent: 1.9 %
Brady Statistic AP VS Percent: 1 %
Brady Statistic AS VP Percent: 91 %
Brady Statistic AS VS Percent: 2.8 %
Brady Statistic RA Percent Paced: 1 %
Date Time Interrogation Session: 20241224044315
HighPow Impedance: 60 Ohm
HighPow Impedance: 60 Ohm
Implantable Lead Connection Status: 753985
Implantable Lead Connection Status: 753985
Implantable Lead Connection Status: 753985
Implantable Lead Implant Date: 20230626
Implantable Lead Implant Date: 20230626
Implantable Lead Implant Date: 20230626
Implantable Lead Location: 753858
Implantable Lead Location: 753859
Implantable Lead Location: 753860
Implantable Lead Model: 7122
Implantable Pulse Generator Implant Date: 20230626
Lead Channel Impedance Value: 440 Ohm
Lead Channel Impedance Value: 530 Ohm
Lead Channel Impedance Value: 590 Ohm
Lead Channel Pacing Threshold Amplitude: 0.75 V
Lead Channel Pacing Threshold Amplitude: 1.875 V
Lead Channel Pacing Threshold Amplitude: 2.125 V
Lead Channel Pacing Threshold Pulse Width: 0.5 ms
Lead Channel Pacing Threshold Pulse Width: 0.5 ms
Lead Channel Pacing Threshold Pulse Width: 0.5 ms
Lead Channel Sensing Intrinsic Amplitude: 11.6 mV
Lead Channel Sensing Intrinsic Amplitude: 2.4 mV
Lead Channel Setting Pacing Amplitude: 2 V
Lead Channel Setting Pacing Amplitude: 2.375
Lead Channel Setting Pacing Amplitude: 3.625
Lead Channel Setting Pacing Pulse Width: 0.5 ms
Lead Channel Setting Pacing Pulse Width: 0.5 ms
Lead Channel Setting Sensing Sensitivity: 0.5 mV
Pulse Gen Serial Number: 8949760

## 2023-10-16 ENCOUNTER — Encounter: Payer: Self-pay | Admitting: Family Medicine

## 2023-11-06 ENCOUNTER — Encounter: Payer: Self-pay | Admitting: Urology

## 2023-11-06 ENCOUNTER — Ambulatory Visit: Payer: Commercial Managed Care - HMO | Admitting: Urology

## 2023-11-06 VITALS — BP 113/69 | HR 83

## 2023-11-06 DIAGNOSIS — R3912 Poor urinary stream: Secondary | ICD-10-CM | POA: Diagnosis not present

## 2023-11-06 DIAGNOSIS — N3941 Urge incontinence: Secondary | ICD-10-CM

## 2023-11-06 DIAGNOSIS — Z8546 Personal history of malignant neoplasm of prostate: Secondary | ICD-10-CM

## 2023-11-06 DIAGNOSIS — R350 Frequency of micturition: Secondary | ICD-10-CM

## 2023-11-06 DIAGNOSIS — N5201 Erectile dysfunction due to arterial insufficiency: Secondary | ICD-10-CM

## 2023-11-06 DIAGNOSIS — N3281 Overactive bladder: Secondary | ICD-10-CM

## 2023-11-06 LAB — URINALYSIS, ROUTINE W REFLEX MICROSCOPIC
Bilirubin, UA: NEGATIVE
Ketones, UA: NEGATIVE
Leukocytes,UA: NEGATIVE
Nitrite, UA: NEGATIVE
Protein,UA: NEGATIVE
RBC, UA: NEGATIVE
Specific Gravity, UA: 1.02 (ref 1.005–1.030)
Urobilinogen, Ur: 0.2 mg/dL (ref 0.2–1.0)
pH, UA: 6 (ref 5.0–7.5)

## 2023-11-06 MED ORDER — SOLIFENACIN SUCCINATE 5 MG PO TABS: 5.0000 mg | ORAL_TABLET | Freq: Every day | ORAL | 11 refills | Status: DC

## 2023-11-06 NOTE — Progress Notes (Signed)
Subjective:  1. History of prostate cancer   2. Overactive bladder   3. Urge incontinence   4. Weak urinary stream   5. Urinary frequency   6. Erectile dysfunction due to arterial insufficiency     11/06/23: Ruben Duffy returns today in f/u.  His PSA in 6/24 was down further to 0.2.  He has had hernia repairs since his last visit.  He did some lifting and has had a small bulge occur under the left mesh.  His IPSS is 14 with nocturia x 3.  He has frequency and urgency.  He is having pain in the scrotum since then.  He drinks 4 cups of coffee in the morning and then will have the frequent. He has 3+ glucose in his urine today on Farxiga.   10/03/22: Ruben Duffy presents today with a recent history of left inguinal pain.  He was seen in the ER with the complaint of pain and an inguinal bulge, but the bulge had resolved on exam along with the pain when he was seen.   He is back with pain and can feel gas bubble through the area and the pain is relieved when he passes gas.    IPSS     Row Name 11/06/23 1000         International Prostate Symptom Score   How often have you had the sensation of not emptying your bladder? Less than half the time     How often have you had to urinate less than every two hours? More than half the time     How often have you found you stopped and started again several times when you urinated? Less than 1 in 5 times     How often have you found it difficult to postpone urination? More than half the time     How often have you had a weak urinary stream? Not at All     How often have you had to strain to start urination? Not at All     Total IPSS Score 11       Quality of Life due to urinary symptoms   If you were to spend the rest of your life with your urinary condition just the way it is now how would you feel about that? Mixed            04/04/22: Ruben Duffy returns today in f/u.  His PSA continues to fall and is down to 0.3 from 0.7.  He continues to have moderate LUTS  with an IPSS of 18.  The frequency is worse with coffee and he can have UUI.  He remains on silodosin 8mg .  He has had no hematuria.  He has some fecal urgency.  He has no weight loss or bone pain.     01/10/22: Ruben Duffy returns today in f/u.  He has previously been seen in [place] for his history of T2a Nx Mx GG2 prostate cancer treated with seeds on 05/15/21.  He had a circumcision at that time as well.  He continues to have moderate to severe LUTS with an IPSS of 20.  He had some burning over the last few days.  He has had no hematuria.  He remains on tamsulosin but hasn't had much improvement.  He had CHF in November and is seeing Dr. Gala Romney for that.   He has some recurrent SOB over the last few days.  He has no edema.   His PSA was down to 0.69 in January from  1.57 preop.  He is doing ok with the circumcision but he still has some redundant skin.   UA today has 3+ glucose but is otherwise clear.   His Hgb A1c was only 6.0 on 09/08/21.   He has some polydipsia on the Farxiga.     ROS:  ROS:  A complete review of systems was performed.  All systems are negative except for pertinent findings as noted.   Review of Systems  Constitutional:  Positive for malaise/fatigue.  Eyes:  Positive for blurred vision.  Gastrointestinal:  Positive for diarrhea.  Musculoskeletal:  Positive for back pain and joint pain.  Skin:  Positive for itching.  Neurological:  Positive for weakness and headaches.  Endo/Heme/Allergies:  Positive for polydipsia. Bruises/bleeds easily.  Psychiatric/Behavioral:  Positive for memory loss.     No Known Allergies  Outpatient Encounter Medications as of 11/06/2023  Medication Sig   carvedilol (COREG) 6.25 MG tablet Take 1 tablet (6.25 mg total) by mouth 2 (two) times daily with a meal.   CINNAMON PO Take 2 tablets by mouth daily.   Cyanocobalamin (VITAMIN B 12 PO) Take 1 tablet by mouth daily.   FARXIGA 10 MG TABS tablet Take 1 tablet (10 mg total) by mouth daily.    folic acid (FOLVITE) 1 MG tablet Take 1 mg by mouth daily.   methotrexate (RHEUMATREX) 2.5 MG tablet Take 15 mg by mouth once a week.   Multiple Vitamin (MULTIVITAMIN ADULT PO) Take by mouth daily.   rosuvastatin (CRESTOR) 10 MG tablet Take 1 tablet (10 mg total) by mouth daily.   sacubitril-valsartan (ENTRESTO) 24-26 MG Take 1 tablet by mouth 2 (two) times daily.   solifenacin (VESICARE) 5 MG tablet Take 1 tablet (5 mg total) by mouth daily.   spironolactone (ALDACTONE) 25 MG tablet Take 1 tablet (25 mg total) by mouth daily.   valACYclovir (VALTREX) 500 MG tablet Take 1 tablet (500 mg total) by mouth 2 (two) times daily as needed.   [DISCONTINUED] furosemide (LASIX) 20 MG tablet Take 1 tablet (20 mg total) by mouth daily.   No facility-administered encounter medications on file as of 11/06/2023.    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 Jude Quadra Assura CRT-D   Arthritis    Bilateral inguinal hernia    a.) s/p BILATERAL repair 11/19/2022; b.) recurrent hernia on LEFT   CAD (coronary artery disease) 09/10/2021   a.) R/LHC 09/10/2021: 30% pLCx - med mgmt.   Cardiomegaly    CHF (congestive heart failure) (HCC)    a.) TTE 09/07/2021: EF 25-30%, diff inf HK, mod LV dil, mod LAE, mod-sev MR; b.) R/LHC 09/10/2021: EF 25%, mPA 13, PCWP 5, CO 6.2, CI 3.1; c.) cMRI 09/11/2021: EF 14%, sev LV dil, diff HK; d.) TTE 01/11/2022: EF 25-30%, glob HK, mod LV dil, mod LAE, mild MR, G2DD; e.) CRT-D implanted 04/08/2022; f.) TTE 08/08/2022: EF 30-35%, glob HK, triv MR, G1DD   DDD (degenerative disc disease), lumbar    Emphysema of lung (HCC)    Erectile dysfunction    a.) on PDE5i (sildenafil) PRN   GERD (gastroesophageal reflux disease)    Hemorrhoids    Hepatitis C virus infection cured after antiviral drug therapy    History of substance abuse (HCC)    a.) THC + crack cocaine   HLD (hyperlipidemia)    Hypertension     Hypospadias, balanic    LBBB (left bundle branch block) 04/26/2021  a.) noted on preop ECG 04/26/2021   Long term current use of amiodarone    Lower urinary tract symptoms (LUTS)    NICM (nonischemic cardiomyopathy) (HCC)    a.) TTE 09/07/2021: EF 25-30%; b.) R/LHC 09/10/2021: mPA 13, PCWP 5, CO 6.2, CI 3.1; c.) cMRI 09/11/2021: EF 14%, diff HK, RV insertion site LGE; d.) TTE 01/11/2022: EF 25-30% e.) CRT-D implanted 04/08/2022; f.) TTE 08/08/2022: EF 30-35%   Pre-diabetes    Prostate cancer (HCC) 01/08/2021   a.) Gleason 3+4, T2a; b.) s/p brachytherapy 06/05/2021   Umbilical hernia    a.) s/p repair 11/19/2022    Past Surgical History:  Procedure Laterality Date   BIOPSY PROSTATE     BIV ICD INSERTION CRT-D N/A 04/08/2022   Procedure: BIV ICD INSERTION CRT-D;  Surgeon: Duke Salvia, MD;  Location: [facility] INVASIVE CV LAB;  Service: Cardiovascular;  Laterality: N/A;   CIRCUMCISION N/A 06/05/2021   Procedure: CIRCUMCISION ADULT;  Surgeon: Bjorn Pippin, MD;  Location: [facility];  Service: Urology;  Laterality: N/A;   COLONOSCOPY W/ POLYPECTOMY     HEMORRHOID SURGERY     HERNIA REPAIR     Had 3 fixed, had re-repair the left groin hernia.   HYPOSPADIAS CORRECTION     INSERTION OF MESH  11/19/2022   Procedure: INSERTION OF MESH;  Surgeon: Leafy Ro, MD;  Location: [facility] ORS;  Service: General;;   RADIOACTIVE SEED IMPLANT N/A 06/05/2021   Procedure: RADIOACTIVE SEED IMPLANT/BRACHYTHERAPY IMPLANT;  Surgeon: Bjorn Pippin, MD;  Location[facility];  Service: Urology;  Laterality: N/A;   RIGHT/LEFT HEART CATH AND CORONARY ANGIOGRAPHY N/A 09/10/2021   Procedure: RIGHT/LEFT HEART CATH AND CORONARY ANGIOGRAPHY;  Surgeon: Dolores Patty, MD;  Location: [facility] INVASIVE CV LAB;  Service: Cardiovascular;  Laterality: N/A;   SPACE OAR INSTILLATION N/A 06/05/2021   Procedure: SPACE OAR INSTILLATION;  Surgeon: Bjorn Pippin, MD;  Location: [facility];   Service: Urology;  Laterality: N/A;   UMBILICAL HERNIA REPAIR N/A 11/19/2022   Procedure: HERNIA REPAIR UMBILICAL ADULT;  Surgeon: Leafy Ro, MD;  Location: [facility] ORS;  Service: General;  Laterality: N/A;    Social History   Socioeconomic History   Marital status: Single    Spouse name: Not on file   Number of children: 1   Years of education: Not on file   Highest education level: GED or equivalent  Occupational History   Occupation: [employer]    Comment: Estate agent  Tobacco Use   Smoking status: Former    Current packs/day: 0.00    Average packs/day: 1.8 packs/day for 30.0 years (52.5 ttl pk-yrs)    Types: Cigarettes    Start date: 15    Quit date: 09/13/2008    Years since quitting: 15.1    Passive exposure: Past   Smokeless tobacco: Never  Vaping Use   Vaping status: Never Used  Substance and Sexual Activity   Alcohol use: Not Currently   Drug use: Not Currently    Types: "Crack" cocaine, Marijuana   Sexual activity: Not Currently  Other Topics Concern   Not on file  Social History Narrative   Lives with mother   Social Drivers of Health   Financial Resource Strain: Low Risk  (08/22/2023)   Overall Financial Resource Strain (CARDIA)    Difficulty of Paying Living Expenses: Not hard at all  Recent Concern: Financial Resource Strain - High Risk (06/24/2023)   Overall Financial Resource Strain (CARDIA)  Difficulty of Paying Living Expenses: Hard  Food Insecurity: No Food Insecurity (08/22/2023)   Hunger Vital Sign    Worried About Running Out of Food in the Last Year: Never true    Ran Out of Food in the Last Year: Never true  Transportation Needs: No Transportation Needs (08/22/2023)   PRAPARE - Administrator, Civil Service (Medical): No    Lack of Transportation (Non-Medical): No  Physical Activity: Unknown (08/22/2023)   Exercise Vital Sign    Days of Exercise per Week: Patient declined    Minutes of Exercise per Session: Not  on file  Stress: No Stress Concern Present (08/22/2023)   [employer] of Occupational Health - Occupational Stress Questionnaire    Feeling of Stress : Not at all  Social Connections: Unknown (08/22/2023)   Social Connection and Isolation Panel [NHANES]    Frequency of Communication with Friends and Family: Patient declined    Frequency of Social Gatherings with Friends and Family: Patient declined    Attends Religious Services: Patient declined    Database administrator or Organizations: No    Attends Engineer, structural: Not on file    Marital Status: Patient declined  Intimate Partner Violence: Not At Risk (07/27/2021)   Humiliation, Afraid, Rape, and Kick questionnaire    Fear of Current or Ex-Partner: No    Emotionally Abused: No    Physically Abused: No    Sexually Abused: No    Family History  Problem Relation Age of Onset   COPD Mother    Stroke Father    Lung cancer Maternal Aunt    Diabetes Maternal Uncle    Brain cancer Maternal Uncle    Lung cancer Maternal Uncle    Lung cancer Maternal Uncle    Cancer Paternal Grandmother    Diabetes Paternal Grandfather    Colon cancer Neg Hx    Esophageal cancer Neg Hx    Stomach cancer Neg Hx    Pancreatic cancer Neg Hx    Colon polyps Neg Hx    Rectal cancer Neg Hx        Objective: Vitals:   11/06/23 1011  BP: 113/69  Pulse: 83     Physical Exam Vitals reviewed.  Constitutional:      Appearance: Normal appearance.  Abdominal:     General: Abdomen is flat.     Palpations: Abdomen is soft.     Hernia: A hernia (He has a small bulge at the upper outer margin of his left hernia repair.) is present.  Genitourinary:    Comments: Normal phallus with adequate meatus. Scrotum has small soft bilateral hydroceles. Testes and epididymis are normal. Neurological:     Mental Status: He is alert.     Lab Results:  PSA No results found for: "PSA" No results found for: "TESTOSTERONE" Lab Results   Component Value Date   PSA1 <0.1 11/06/2023   PSA1 0.2 03/27/2023   PSA1 0.4 10/03/2022        Studies/Results: No results found. Recent Results (from the past 2160 hours)  Comprehensive Metabolic Panel (CMET)     Status: Abnormal   Collection Time: 09/23/23 10:34 AM  Result Value Ref Range   Sodium 139 135 - 145 mmol/L   Potassium 4.3 3.5 - 5.1 mmol/L   Chloride 106 98 - 111 mmol/L   CO2 27 22 - 32 mmol/L   Glucose, Bld 97 70 - 99 mg/dL    Comment: Glucose reference range applies  only to samples taken after fasting for at least 8 hours.   BUN 8 6 - 20 mg/dL   Creatinine, Ser 1.61 0.61 - 1.24 mg/dL   Calcium 9.3 8.9 - 09.6 mg/dL   Total Protein 6.4 (L) 6.5 - 8.1 g/dL   Albumin 3.6 3.5 - 5.0 g/dL   AST 20 15 - 41 U/L   ALT 17 0 - 44 U/L   Alkaline Phosphatase 47 38 - 126 U/L   Total Bilirubin 1.1 <1.2 mg/dL   GFR, Estimated >04 >54 mL/min    Comment: (NOTE) Calculated using the CKD-EPI Creatinine Equation (2021)    Anion gap 6 5 - 15    Comment: Performed at [facility], [address] [place], [place] [address]  B Nat Peptide     Status: None   Collection Time: 09/23/23 10:34 AM  Result Value Ref Range   B Natriuretic Peptide 71.3 0.0 - 100.0 pg/mL    Comment: Performed at [facility], [address] [place], [place] [address]  HgB A1c     Status: Abnormal   Collection Time: 09/23/23 10:34 AM  Result Value Ref Range   Hgb A1c MFr Bld 5.8 (H) 4.8 - 5.6 %    Comment: (NOTE) Pre diabetes:          5.7%-6.4%  Diabetes:              >6.4%  Glycemic control for   <7.0% adults with diabetes    Mean Plasma Glucose 119.76 mg/dL    Comment: Performed at [facility], [address] [place], [place] [address]  Lipid Profile     Status: None   Collection Time: 09/23/23 10:34 AM  Result Value Ref Range   Cholesterol 132 0 - 200 mg/dL   Triglycerides 66 <130 mg/dL   HDL 57 >86 mg/dL   Total CHOL/HDL Ratio 2.3 RATIO   VLDL 13 0 - 40 mg/dL    LDL Cholesterol 62 0 - 99 mg/dL    Comment:        Total Cholesterol/HDL:CHD Risk Coronary Heart Disease Risk Table                     Men   Women  1/2 Average Risk   3.4   3.3  Average Risk       5.0   4.4  2 X Average Risk   9.6   7.1  3 X Average Risk  23.4   11.0        Use the calculated Patient Ratio above and the CHD Risk Table to determine the patient's CHD Risk.        ATP III CLASSIFICATION (LDL):  <100     mg/dL   Optimal  578-469  mg/dL   Near or Above                    Optimal  130-159  mg/dL   Borderline  629-528  mg/dL   High  >413     mg/dL   Very High Performed at [facility], [address] [place], [place] [address]   TSH     Status: None   Collection Time: 09/23/23 10:34 AM  Result Value Ref Range   TSH 0.748 0.350 - 4.500 uIU/mL    Comment: Performed by a 3rd Generation assay with a functional sensitivity of <=0.01 uIU/mL. Performed at [facility], [address] [place], [place] [address]   Magnesium     Status: None   Collection Time: 09/23/23 10:34 AM  Result Value Ref Range   Magnesium 2.1 1.7 - 2.4 mg/dL    Comment: Performed at [facility], [address] [place], [place] [address]  CUP PACEART REMOTE DEVICE CHECK     Status: None   Collection Time: 10/07/23  4:43 AM  Result Value Ref Range   Date Time Interrogation Session [id number]    Pulse Generator Manufacturer SJ[employer]    Pulse Gen Model 3369-40C Dillon Bjork MP    Pulse Gen Serial Number [id number]    Clinic Name [facility]    Implantable Pulse Generator Type Cardiac Resynch Therapy Defibulator    Implantable Pulse Generator Implant Date [id number]    Implantable Lead Manufacturer [facility]    Implantable Lead Model 1458Q Quartet    Implantable Lead Serial Number [id number]    Implantable Lead Implant Date [id number]    Implantable Lead Location Detail 1 UNKNOWN    Implantable Lead Location [id number]    Implantable Lead Connection Status [id number]    Implantable Lead  Manufacturer [facility]    Implantable Lead Model 7122 [address]ata    Implantable Lead Serial Number [id number]    Implantable Lead Implant Date [id number]    Implantable Lead Location Detail 1 UNKNOWN    Implantable Lead Location [id number]    Implantable Lead Connection Status [id number]    Implantable Lead Manufacturer [facility]    Implantable Lead Model [id number] Tendril MRI    Implantable Lead Serial Number [id number]    Implantable Lead Implant Date [id number]    Implantable Lead Location Detail 1 UNKNOWN    Implantable Lead Location [id number]    Implantable Lead Connection Status [id number]    Lead Channel Setting Sensing Sensitivity 0.5 mV   Lead Channel Setting Sensing Adaptation Mode Adaptive Sensing    Lead Channel Setting Pacing Amplitude 3.625    Lead Channel Setting Pacing Pulse Width 0.5 ms   Lead Channel Setting Pacing Amplitude 2.0 V   Lead Channel Setting Pacing Pulse Width 0.5 ms   Lead Channel Setting Pacing Amplitude 2.375    Lead Channel Setting Pacing Capture Mode Adaptive Capture    Zone Setting Status Active    Zone Setting Status Active    Zone Setting Status Active    Lead Channel Status NULL    Lead Channel Impedance Value 590 ohm   Lead Channel Pacing Threshold Amplitude 1.875 V   Lead Channel Pacing Threshold Pulse Width 0.5 ms   Lead Channel Status NULL    Lead Channel Impedance Value 530 ohm   Lead Channel Sensing Intrinsic Amplitude 2.4 mV   Lead Channel Pacing Threshold Amplitude 2.125 V   Lead Channel Pacing Threshold Pulse Width 0.5 ms   Lead Channel Status NULL    Lead Channel Impedance Value 440 ohm   Lead Channel Sensing Intrinsic Amplitude 11.6 mV   Lead Channel Pacing Threshold Amplitude 0.75 V   Lead Channel Pacing Threshold Pulse Width 0.5 ms   HighPow Impedance 60 ohm   HighPow Impedance 60 ohm   HighPow Imped Status NULL    HighPow Imped Status NULL    Battery Status MOS    Battery Remaining Longevity 73 mo   Battery Remaining Percentage 80.0 %   Battery Voltage  3.01 V   Brady Statistic RA Percent Paced 1.0 %   Brady Statistic AP VP Percent 1.9 %   [place] AS VP Percent  91.0 %   Brady Statistic AP VS Percent 1.0 %   Brady Statistic AS VS Percent 2.8 %  Urinalysis, Routine w reflex microscopic     Status: Abnormal   Collection Time: 11/06/23 10:13 AM  Result Value Ref Range   Specific Gravity, UA 1.020 1.005 - 1.030   pH, UA 6.0 5.0 - 7.5   Color, UA Yellow Yellow   Appearance Ur Clear Clear   Leukocytes,UA Negative Negative   Protein,UA Negative Negative/Trace   Glucose, UA 3+ (A) Negative   Ketones, UA Negative Negative   RBC, UA Negative Negative   Bilirubin, UA Negative Negative   Urobilinogen, Ur 0.2 0.2 - 1.0 mg/dL   Nitrite, UA Negative Negative   Microscopic Examination Comment     Comment: Microscopic not indicated and not performed.  PSA     Status: None   Collection Time: 11/06/23 10:47 AM  Result Value Ref Range   Prostate Specific Ag, Serum <0.1 0.0 - 4.0 ng/mL    Comment: Roche ECLIA methodology. According to the American Urological Association, Serum PSA should decrease and remain at undetectable levels after radical prostatectomy. The AUA defines biochemical recurrence as an initial PSA value 0.2 ng/mL or greater followed by a subsequent confirmatory PSA value 0.2 ng/mL or greater. Values obtained with different assay methods or kits cannot be used interchangeably. Results cannot be interpreted as absolute evidence of the presence or absence of malignant disease.    UA is clear.   I have reviewed his recent ER visit notes.     Assessment & Plan: Recurrent Left inguinal hernia with pain.   I have recommended that he return to his surgeon.    History of prostate cancer.   His PSA at last f/u was down to 0.2 with the seed implant in June.  He will get that today.  I will have him return in 6 months with a PSA    OAB with urgency and UUI.  He was unable to get coverage for [facility] or Myrbetriq.  I will try  him on vesicare 5mg  side effects reviewed.  Glucosuria.  He is on [place].   ED.  He hasn't been using the sildenafil.  He has some pain post ejaculation.  Meds ordered this encounter  Medications   solifenacin (VESICARE) 5 MG tablet    Sig: Take 1 tablet (5 mg total) by mouth daily.    Dispense:  30 tablet    Refill:  11     Orders Placed This Encounter  Procedures   Urinalysis, Routine w reflex microscopic   PSA    Standing Status:   Future    Expected Date:   05/05/2024    Expiration Date:   11/05/2024   PSA      Return in about 6 months (around 05/05/2024) for sarah wiith PSA. Marland Kitchen   CC: Billie Lade, MD      Bjorn Pippin 11/07/2023

## 2023-11-07 ENCOUNTER — Encounter: Payer: Self-pay | Admitting: Physical Medicine and Rehabilitation

## 2023-11-07 LAB — PSA: Prostate Specific Ag, Serum: <0.1 ng/mL (ref 0.0–4.0)

## 2023-11-12 ENCOUNTER — Encounter: Payer: Self-pay | Admitting: Internal Medicine

## 2023-11-17 ENCOUNTER — Ambulatory Visit (INDEPENDENT_AMBULATORY_CARE_PROVIDER_SITE_OTHER): Payer: Medicaid Other | Admitting: Surgery

## 2023-11-17 ENCOUNTER — Encounter: Payer: Self-pay | Admitting: Surgery

## 2023-11-17 VITALS — BP 103/63 | HR 77 | Temp 98.0°F | Ht 68.0 in | Wt 165.0 lb

## 2023-11-17 DIAGNOSIS — R1032 Left lower quadrant pain: Secondary | ICD-10-CM | POA: Diagnosis not present

## 2023-11-17 MED ORDER — NAPROXEN 500 MG PO TABS
500.0000 mg | ORAL_TABLET | Freq: Two times a day (BID) | ORAL | 0 refills | Status: AC
Start: 2023-11-17 — End: 2023-12-01

## 2023-11-17 MED ORDER — GABAPENTIN 300 MG PO CAPS: 300.0000 mg | ORAL_CAPSULE | Freq: Every day | ORAL | 0 refills | Status: DC

## 2023-11-17 NOTE — Patient Instructions (Addendum)
You may try using ice to the area 2 times a day, and Naproxen twice daily for 2 weeks.  You may take Gabapentin once daily in the evening to see if this helps.     Call us to follow up here if this does not help you.

## 2023-11-18 NOTE — Progress Notes (Signed)
Outpatient Surgical Follow Up  11/18/2023  Ruben Duffy is an 58 y.o. male.   Chief Complaint  Patient presents with   Follow-up    HPI: Ruben Duffy is 58 year old male 1 year out from his original bilateral inguinal hernia repair with mesh.  He did have a recurrence 3 months afterwards.  We had to go back and read did the left side robotically.  He reports some intermittent left inguinal pain and he seems to feel a bulge.  The pain seems to be worsening when doing some activities.  The pain actually started after he was pushing his mother in a wheelchair.  He denies any trauma.  No fevers no chills.  He does have a significant cardiac history HE  Does have ICD and  EF 30%. No recent cardiac issues. Did have a recent CT of the lung for lung screening cancer that have personally reviewed showing evidence of COPD but without any lung masses  Past Medical History:  Diagnosis Date   AICD (automatic cardioverter/defibrillator) present 04/08/2022   a.) St. Jude Quadra Assura CRT-D   Arthritis    Bilateral inguinal hernia    a.) s/p BILATERAL repair 11/19/2022; b.) recurrent hernia on LEFT   CAD (coronary artery disease) 09/10/2021   a.) R/LHC 09/10/2021: 30% pLCx - med mgmt.   Cardiomegaly    CHF (congestive heart failure) (HCC)    a.) TTE 09/07/2021: EF 25-30%, diff inf HK, mod LV dil, mod LAE, mod-sev MR; b.) R/LHC 09/10/2021: EF 25%, mPA 13, PCWP 5, CO 6.2, CI 3.1; c.) cMRI 09/11/2021: EF 14%, sev LV dil, diff HK; d.) TTE 01/11/2022: EF 25-30%, glob HK, mod LV dil, mod LAE, mild MR, G2DD; e.) CRT-D implanted 04/08/2022; f.) TTE 08/08/2022: EF 30-35%, glob HK, triv MR, G1DD   DDD (degenerative disc disease), lumbar    Emphysema of lung (HCC)    Erectile dysfunction    a.) on PDE5i (sildenafil) PRN   GERD (gastroesophageal reflux disease)    Hemorrhoids    Hepatitis C virus infection cured after antiviral drug therapy    History of substance abuse (HCC)    a.) THC + crack cocaine   HLD  (hyperlipidemia)    Hypertension    Hypospadias, balanic    LBBB (left bundle branch block) 04/26/2021   a.) noted on preop ECG 04/26/2021   Long term current use of amiodarone    Lower urinary tract symptoms (LUTS)    NICM (nonischemic cardiomyopathy) (HCC)    a.) TTE 09/07/2021: EF 25-30%; b.) R/LHC 09/10/2021: mPA 13, PCWP 5, CO 6.2, CI 3.1; c.) cMRI 09/11/2021: EF 14%, diff HK, RV insertion site LGE; d.) TTE 01/11/2022: EF 25-30% e.) CRT-D implanted 04/08/2022; f.) TTE 08/08/2022: EF 30-35%   Pre-diabetes    Prostate cancer (HCC) 01/08/2021   a.) Gleason 3+4, T2a; b.) s/p brachytherapy 06/05/2021   Umbilical hernia    a.) s/p repair 11/19/2022    Past Surgical History:  Procedure Laterality Date   BIOPSY PROSTATE     BIV ICD INSERTION CRT-D N/A 04/08/2022   Procedure: BIV ICD INSERTION CRT-D;  Surgeon: Duke Salvia, MD;  Location: [facility] INVASIVE CV LAB;  Service: Cardiovascular;  Laterality: N/A;   CIRCUMCISION N/A 06/05/2021   Procedure: CIRCUMCISION ADULT;  Surgeon: Bjorn Pippin, MD;  Location: [facility];  Service: Urology;  Laterality: N/A;   COLONOSCOPY W/ POLYPECTOMY     HEMORRHOID SURGERY     HERNIA REPAIR     Had 3 fixed,  had re-repair the left groin hernia.   HYPOSPADIAS CORRECTION     INSERTION OF MESH  11/19/2022   Procedure: INSERTION OF MESH;  Surgeon: Leafy Ro, MD;  Location: [facility];  Service: General;;   RADIOACTIVE SEED IMPLANT N/A 06/05/2021   Procedure: RADIOACTIVE SEED IMPLANT/BRACHYTHERAPY IMPLANT;  Surgeon: Bjorn Pippin, MD;  Location[facility];  Service: Urology;  Laterality: N/A;   RIGHT/LEFT HEART CATH AND CORONARY ANGIOGRAPHY N/A 09/10/2021   Procedure: RIGHT/LEFT HEART CATH AND CORONARY ANGIOGRAPHY;  Surgeon: Dolores Patty, MD;  Location: [facility] INVASIVE CV LAB;  Service: Cardiovascular;  Laterality: N/A;   SPACE OAR INSTILLATION N/A 06/05/2021   Procedure: SPACE OAR INSTILLATION;  Surgeon: Bjorn Pippin, MD;   Location: [facility];  Service: Urology;  Laterality: N/A;   UMBILICAL HERNIA REPAIR N/A 11/19/2022   Procedure: HERNIA REPAIR UMBILICAL ADULT;  Surgeon: Leafy Ro, MD;  Location: [facility];  Service: General;  Laterality: N/A;    Family History  Problem Relation Age of Onset   COPD Mother    Stroke Father    Lung cancer Maternal Aunt    Diabetes Maternal Uncle    Brain cancer Maternal Uncle    Lung cancer Maternal Uncle    Lung cancer Maternal Uncle    Cancer Paternal Grandmother    Diabetes Paternal Grandfather    Colon cancer Neg Hx    Esophageal cancer Neg Hx    Stomach cancer Neg Hx    Pancreatic cancer Neg Hx    Colon polyps Neg Hx    Rectal cancer Neg Hx     Social History:  reports that he quit smoking about 15 years ago. His smoking use included cigarettes. He started smoking about 46 years ago. He has a 52.5 pack-year smoking history. He has been exposed to tobacco smoke. He has never used smokeless tobacco. He reports that he does not currently use alcohol. He reports that he does not currently use drugs after having used the following drugs: "Crack" cocaine and Marijuana.  Allergies: No Known Allergies  Medications reviewed.    ROS Full ROS performed and is otherwise negative other than what is stated in HPI   BP 103/63   Pulse 77   Temp 98 F (36.7 C)   Ht 5\' 8"  (1.727 m)   Wt 165 lb (74.8 kg)   SpO2 99%   BMI 25.09 kg/m   Physical Exam Vitals and nursing note reviewed. Exam conducted with a chaperone present.  Constitutional:      General: He is not in acute distress.    Appearance: Normal appearance.  Eyes:     General: No scleral icterus.       Right eye: No discharge.        Left eye: No discharge.  Abdominal:     General: Abdomen is flat. There is no distension.     Palpations: Abdomen is soft. There is no mass.     Tenderness: There is abdominal tenderness. There is no guarding or rebound.     Hernia: No hernia is  present.     Comments: Mild tenderness in left inguinal area without evidence of hernias, infection hematomas or complications  Musculoskeletal:        General: No swelling or tenderness. Normal range of motion.  Skin:    General: Skin is warm and dry.     Capillary Refill: Capillary refill takes less than 2 seconds.  Neurological:     General:  No focal deficit present.     Mental Status: He is alert and oriented to person, place, and time.  Psychiatric:        Mood and Affect: Mood normal.        Behavior: Behavior normal.        Thought Content: Thought content normal.        Judgment: Judgment normal.     Assessment/Plan: 58 year old male with prior bilateral inguinal hernia repair robotically with recurrence in the left side requiring redo surgery.  Now comes with left inguinodynia.  There is no clinical evidence of recurrence at this time.  We will start medical management of left inguinodynia with nighttime gabapentin and short course of NSAIDs.  He is from out of town and will prefer to call us back for follow-up.  He understands if the pain does not improve may need to do a CT scan to interrogate any further pathology and also may do left ilioinguinal nerve block.  At this time no need for immediate surgical intervention. Please note the spent 30 minutes in this encounter including personally reviewing medical records, personally reviewing prior operative reports, personally reviewing imaging studies, placing orders, coordinating his care and performing documentation    Sterling Big, MD [facility] General Surgeon

## 2023-11-26 ENCOUNTER — Ambulatory Visit (INDEPENDENT_AMBULATORY_CARE_PROVIDER_SITE_OTHER): Payer: Medicaid Other | Admitting: Internal Medicine

## 2023-11-26 ENCOUNTER — Encounter: Payer: Self-pay | Admitting: Internal Medicine

## 2023-11-26 VITALS — BP 98/62 | HR 73 | Ht 68.0 in | Wt 168.2 lb

## 2023-11-26 DIAGNOSIS — Z23 Encounter for immunization: Secondary | ICD-10-CM

## 2023-11-26 DIAGNOSIS — I10 Essential (primary) hypertension: Secondary | ICD-10-CM | POA: Diagnosis not present

## 2023-11-26 DIAGNOSIS — Z87891 Personal history of nicotine dependence: Secondary | ICD-10-CM | POA: Diagnosis not present

## 2023-11-26 DIAGNOSIS — M1990 Unspecified osteoarthritis, unspecified site: Secondary | ICD-10-CM

## 2023-11-26 DIAGNOSIS — E782 Mixed hyperlipidemia: Secondary | ICD-10-CM | POA: Diagnosis not present

## 2023-11-26 DIAGNOSIS — B001 Herpesviral vesicular dermatitis: Secondary | ICD-10-CM

## 2023-11-26 DIAGNOSIS — I5022 Chronic systolic (congestive) heart failure: Secondary | ICD-10-CM | POA: Diagnosis not present

## 2023-11-26 DIAGNOSIS — N3281 Overactive bladder: Secondary | ICD-10-CM | POA: Diagnosis not present

## 2023-11-26 DIAGNOSIS — R7303 Prediabetes: Secondary | ICD-10-CM | POA: Diagnosis not present

## 2023-11-26 MED ORDER — VALACYCLOVIR HCL 500 MG PO TABS: 500.0000 mg | ORAL_TABLET | Freq: Two times a day (BID) | ORAL | 2 refills | Status: AC | PRN

## 2023-11-26 NOTE — Patient Instructions (Signed)
It was a pleasure to see you today.  Thank you for giving [place] the opportunity to be involved in your care.  Below is a brief recap of your visit and next steps.  We will plan to see you again in 6 months.  Summary No medication changes today Valtrex refilled Follow up in 6 months

## 2023-11-26 NOTE — Progress Notes (Signed)
 Established Patient Office Visit  Subjective   Patient ID: Ruben Duffy, male    DOB: September 07, 1966  Age: 58 y.o. MRN: [id number]  Chief Complaint  Patient presents with   Care Management    Three month follow up    Ruben Duffy returns to care today for routine follow-up.  He was last evaluated by me in November 2024.  No medication changes were made at that time and 55-month follow-up was arranged.  In the interim, he has been evaluated by the advanced HF team, urology, and general surgery in the setting of a left inguinal discomfort with history of bilateral inguinal hernia repair. Today Ruben Duffy reports feeling well. He is asymptomatic and has no acute concerns to discuss.   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 Jude Quadra Assura CRT-D   Arthritis    Bilateral inguinal hernia    a.) s/p BILATERAL repair 11/19/2022; b.) recurrent hernia on LEFT   CAD (coronary artery disease) 09/10/2021   a.) R/LHC 09/10/2021: 30% pLCx - med mgmt.   Cardiomegaly    CHF (congestive heart failure) (HCC)    a.) TTE 09/07/2021: EF 25-30%, diff inf HK, mod LV dil, mod LAE, mod-sev MR; b.) R/LHC 09/10/2021: EF 25%, mPA 13, PCWP 5, CO 6.2, CI 3.1; c.) cMRI 09/11/2021: EF 14%, sev LV dil, diff HK; d.) TTE 01/11/2022: EF 25-30%, glob HK, mod LV dil, mod LAE, mild MR, G2DD; e.) CRT-D implanted 04/08/2022; f.) TTE 08/08/2022: EF 30-35%, glob HK, triv MR, G1DD   DDD (degenerative disc disease), lumbar    Emphysema of lung (HCC)    Erectile dysfunction    a.) on PDE5i (sildenafil) PRN   GERD (gastroesophageal reflux disease)    Hemorrhoids    Hepatitis C virus infection cured after antiviral drug therapy    History of substance abuse (HCC)    a.) THC + crack cocaine   HLD (hyperlipidemia)    Hypertension    Hypospadias, balanic    LBBB (left bundle branch block) 04/26/2021   a.) noted on preop ECG 04/26/2021   Long term current use  of amiodarone    Lower urinary tract symptoms (LUTS)    NICM (nonischemic cardiomyopathy) (HCC)    a.) TTE 09/07/2021: EF 25-30%; b.) R/LHC 09/10/2021: mPA 13, PCWP 5, CO 6.2, CI 3.1; c.) cMRI 09/11/2021: EF 14%, diff HK, RV insertion site LGE; d.) TTE 01/11/2022: EF 25-30% e.) CRT-D implanted 04/08/2022; f.) TTE 08/08/2022: EF 30-35%   Pre-diabetes    Prostate cancer (HCC) 01/08/2021   a.) Gleason 3+4, T2a; b.) s/p brachytherapy 06/05/2021   Umbilical hernia    a.) s/p repair 11/19/2022   Past Surgical History:  Procedure Laterality Date   BIOPSY PROSTATE     BIV ICD INSERTION CRT-D N/A 04/08/2022   Procedure: BIV ICD INSERTION CRT-D;  Surgeon: Duke Salvia, MD;  Location: [facility] INVASIVE CV LAB;  Service: Cardiovascular;  Laterality: N/A;   CIRCUMCISION N/A 06/05/2021   Procedure: CIRCUMCISION ADULT;  Surgeon: Bjorn Pippin, MD;  Location: [facility];  Service: Urology;  Laterality: N/A;   COLONOSCOPY W/ POLYPECTOMY     HEMORRHOID SURGERY     HERNIA REPAIR     Had 3 fixed, had re-repair the left groin hernia.   HYPOSPADIAS CORRECTION     INSERTION OF MESH  11/19/2022   Procedure: INSERTION OF MESH;  Surgeon: Leafy Ro, MD;  Location: [facility];  Service: General;;   RADIOACTIVE SEED IMPLANT N/A 06/05/2021   Procedure: RADIOACTIVE SEED IMPLANT/BRACHYTHERAPY IMPLANT;  Surgeon: Bjorn Pippin, MD;  Location: [facility];  Service: Urology;  Laterality: N/A;   RIGHT/LEFT HEART CATH AND CORONARY ANGIOGRAPHY N/A 09/10/2021   Procedure: RIGHT/LEFT HEART CATH AND CORONARY ANGIOGRAPHY;  Surgeon: Dolores Patty, MD;  Location: [facility] INVASIVE CV LAB;  Service: Cardiovascular;  Laterality: N/A;   SPACE OAR INSTILLATION N/A 06/05/2021   Procedure: SPACE OAR INSTILLATION;  Surgeon: Bjorn Pippin, MD;  Location[facility];  Service: Urology;  Laterality: N/A;   UMBILICAL HERNIA REPAIR N/A 11/19/2022   Procedure: HERNIA REPAIR UMBILICAL ADULT;  Surgeon:  Leafy Ro, MD;  Location: [facility];  Service: General;  Laterality: N/A;   Social History   Tobacco Use   Smoking status: Former    Current packs/day: 0.00    Average packs/day: 1.8 packs/day for 30.0 years (52.5 ttl pk-yrs)    Types: Cigarettes    Start date: 20    Quit date: 09/13/2008    Years since quitting: 15.2    Passive exposure: Past   Smokeless tobacco: Never  Vaping Use   Vaping status: Never Used  Substance Use Topics   Alcohol use: Not Currently   Drug use: Not Currently    Types: "Crack" cocaine, Marijuana   Family History  Problem Relation Age of Onset   COPD Mother    Stroke Father    Lung cancer Maternal Aunt    Diabetes Maternal Uncle    Brain cancer Maternal Uncle    Lung cancer Maternal Uncle    Lung cancer Maternal Uncle    Cancer Paternal Grandmother    Diabetes Paternal Grandfather    Colon cancer Neg Hx    Esophageal cancer Neg Hx    Stomach cancer Neg Hx    Pancreatic cancer Neg Hx    Colon polyps Neg Hx    Rectal cancer Neg Hx    No Known Allergies  Review of Systems  Constitutional:  Negative for chills and fever.  HENT:  Negative for sore throat.   Respiratory:  Negative for cough and shortness of breath.   Cardiovascular:  Negative for chest pain, palpitations and leg swelling.  Gastrointestinal:  Negative for abdominal pain, blood in stool, constipation, diarrhea, nausea and vomiting.  Genitourinary:  Negative for dysuria and hematuria.  Musculoskeletal:  Negative for myalgias.  Skin:  Negative for itching and rash.  Neurological:  Negative for dizziness and headaches.  Psychiatric/Behavioral:  Negative for depression and suicidal ideas.      Objective:     BP 98/62 (BP Location: Left Arm, Patient Position: Sitting, Cuff Size: Normal)   Pulse 73   Ht 5\' 8"  (1.727 m)   Wt 168 lb 3.2 oz (76.3 kg)   SpO2 95%   BMI 25.57 kg/m  BP Readings from Last 3 Encounters:  12/11/23 116/75  11/26/23 98/62  11/17/23 103/63    Physical Exam Vitals reviewed.  Constitutional:      General: He is not in acute distress.    Appearance: Normal appearance. He is not ill-appearing.  HENT:     Head: Normocephalic and atraumatic.     Right Ear: External ear normal.     Left Ear: External ear normal.     Nose: Nose normal. No congestion or rhinorrhea.     Mouth/Throat:     Mouth: Mucous membranes are moist.     Pharynx: Oropharynx is clear.  Eyes:  General: No scleral icterus.    Extraocular Movements: Extraocular movements intact.     Conjunctiva/sclera: Conjunctivae normal.     Pupils: Pupils are equal, round, and reactive to light.  Cardiovascular:     Rate and Rhythm: Normal rate and regular rhythm.     Pulses: Normal pulses.     Heart sounds: Normal heart sounds. No murmur heard. Pulmonary:     Effort: Pulmonary effort is normal.     Breath sounds: Normal breath sounds. No wheezing, rhonchi or rales.  Abdominal:     General: Abdomen is flat. Bowel sounds are normal. There is no distension.     Palpations: Abdomen is soft.     Tenderness: There is no abdominal tenderness.  Musculoskeletal:        General: No swelling or deformity. Normal range of motion.     Cervical back: Normal range of motion.  Skin:    General: Skin is warm and dry.     Capillary Refill: Capillary refill takes less than 2 seconds.  Neurological:     General: No focal deficit present.     Mental Status: He is alert and oriented to person, place, and time.     Motor: No weakness.  Psychiatric:        Mood and Affect: Mood normal.        Behavior: Behavior normal.        Thought Content: Thought content normal.   Last CBC Lab Results  Component Value Date   WBC 9.2 06/24/2023   HGB 16.1 06/24/2023   HCT 49.6 06/24/2023   MCV 90.0 06/24/2023   MCH 29.2 06/24/2023   RDW 14.6 06/24/2023   PLT 289 06/24/2023   Last metabolic panel Lab Results  Component Value Date   GLUCOSE 97 09/23/2023   NA 139 09/23/2023   K 4.3  09/23/2023   CL 106 09/23/2023   CO2 27 09/23/2023   BUN 8 09/23/2023   CREATININE 1.04 09/23/2023   GFRNONAA >60 09/23/2023   CALCIUM 9.3 09/23/2023   PROT 6.4 (L) 09/23/2023   ALBUMIN 3.6 09/23/2023   LABGLOB 2.6 03/29/2022   AGRATIO 1.7 03/29/2022   BILITOT 1.1 09/23/2023   ALKPHOS 47 09/23/2023   AST 20 09/23/2023   ALT 17 09/23/2023   ANIONGAP 6 09/23/2023   Last lipids Lab Results  Component Value Date   CHOL 132 09/23/2023   HDL 57 09/23/2023   LDLCALC 62 09/23/2023   TRIG 66 09/23/2023   CHOLHDL 2.3 09/23/2023   Last hemoglobin A1c Lab Results  Component Value Date   HGBA1C 5.8 (H) 09/23/2023   Last thyroid functions Lab Results  Component Value Date   TSH 0.748 09/23/2023     Assessment & Plan:   Problem List Items Addressed This Visit       Essential hypertension   Remains adequately controlled on current antihypertensive regimen.      Chronic systolic heart failure (HCC) - Primary   Remains euvolemic on exam.  Recently seen by cardiology for follow-up.  On appropriate GDMT.  No medication changes are indicated today.      Recurrent cold sores   Valtrex refilled today      Inflammatory arthritis   Followed by [facility] rheumatology.  Remains on methotrexate and folic acid supplementation.  Symptoms are adequately controlled.      OAB (overactive bladder)   Followed by urology (Dr. Annabell Howells).  Recently switched to Vesicare, which has been effective.      Hyperlipidemia  Lipid panel updated in December.  Total cholesterol 132 and LDL 62.  He remains on rosuvastatin 10 mg daily.  No medication changes are indicated at this time.      Former tobacco use   Quit 2009.  Accumulated a 52.5-pack-year smoking history.  Last underwent LDCT in February 2024.  Repeat LDCT recommended for 12 months.  Will message lung cancer screening program.      Prediabetes   A1c has improved to 5.8 from 6.3 previously.  He remains on Farxiga.  He was congratulated  on his progress.      Return in about 6 months (around 05/25/2024).   Billie Lade, MD

## 2023-11-28 ENCOUNTER — Encounter: Payer: Self-pay | Admitting: *Deleted

## 2023-12-11 ENCOUNTER — Encounter: Payer: Self-pay | Admitting: Physical Medicine and Rehabilitation

## 2023-12-11 ENCOUNTER — Ambulatory Visit: Payer: Medicaid Other | Admitting: Physical Medicine and Rehabilitation

## 2023-12-11 VITALS — BP 116/75 | HR 73

## 2023-12-11 DIAGNOSIS — M545 Low back pain, unspecified: Secondary | ICD-10-CM | POA: Diagnosis not present

## 2023-12-11 DIAGNOSIS — G8929 Other chronic pain: Secondary | ICD-10-CM

## 2023-12-11 DIAGNOSIS — M47816 Spondylosis without myelopathy or radiculopathy, lumbar region: Secondary | ICD-10-CM

## 2023-12-11 NOTE — Progress Notes (Unsigned)
 Ruben Duffy - 58 y.o. male MRN [id number]  Date of birth: 1966/09/15  Office Visit Note: Visit Date: 12/11/2023 PCP: Billie Lade, MD Referred by: Billie Lade, MD  Subjective: Chief Complaint  Patient presents with   Lower Back - Pain   HPI: Ruben Duffy is a 58 y.o. male who comes in today for evaluation of chronic, worsening and severe left sided lower back pain. He is a long time patient of Dr. Ophelia Charter. Pain ongoing for several years, worsens with standing, walking and activity. He describes pain as sore and aching sensation, currently rates as 8 out of 10. Some relief of pain with home exercise regimen, rest and use of medications. He does take Gabapentin at bedtime. History of formal physical therapy with minimal relief of pain. Lumbar MRI imaging from 2023 exhibits multi level degenerative changes, facet arthropathy is greatest at L2-L3, L3-L4, and L5-S1. No high grade spinal canal stenosis noted. There is right lateral recess stenosis noted on the right at L3-L4. Patent has undergone 2 lumbar epidural steroid injections and facet joint injections in our office with no relief of pain. Patient denies focal weakness, numbness and tingling. No recent trauma or falls.       Review of Systems  Musculoskeletal:  Positive for back pain.  Neurological:  Negative for tingling, sensory change, focal weakness and weakness.  All other systems reviewed and are negative.  Otherwise per HPI.  Assessment & Plan: Visit Diagnoses:    ICD-10-CM   1. Chronic left-sided low back pain without sciatica  M54.50 MR LUMBAR SPINE WO CONTRAST   G89.29     2. Spondylosis without myelopathy or radiculopathy, lumbar region  M47.816 MR LUMBAR SPINE WO CONTRAST    3. Facet arthropathy, lumbar  M47.816 MR LUMBAR SPINE WO CONTRAST       Plan: Findings:  Chronic, worsening and severe left sided lower back pain. No radicular symptoms down the legs. Patient continues to have severe pain despite  good conservative therapies such as formal physical therapy, home exercise regimen, rest and use of medications. Patients clinical presentation and exam are consistent with facet mediated pain. He does have multi level facet arthropathy on MRI imaging from 2023. His presentation is a bit confusing, he did not get good relief of pain with left sided facet joint injections in August of 2024. I spoke with patient in detail today regarding plan of care. Next step is to obtain new lumbar MRI imaging. Depending on results of MRI imaging could look at trying medial branch block with short follow up and possibility for radiofrequency ablation. Other options would include re-grouping with formal physical therapy/chiropractic treatments, medication management and chronic pain management. Patient has no questions at this time. We will see him back for lumbar MRI review. No red flag symptoms noted upon exam today.     Meds & Orders: No orders of the defined types were placed in this encounter.   Orders Placed This Encounter  Procedures   MR LUMBAR SPINE WO CONTRAST    Follow-up: Return for Lumbar MRI imaging.   Procedures: No procedures performed      Clinical History: MRI LUMBAR SPINE WITHOUT CONTRAST   TECHNIQUE: Multiplanar, multisequence MR imaging of the lumbar spine was performed. No intravenous contrast was administered.   COMPARISON:  None Available.   FINDINGS: Segmentation:  Standard.   Alignment: Trace retrolisthesis at L1-L2. Trace anterolisthesis at L5-S1.   Vertebrae: Chronic anterior wedging of L1. Degenerative endplate irregularity  at T12-L1: L1-L2, L3-L4, and L4-L5. Mild degenerative endplate marrow edema. Vertebral body hemangioma at L4.   Conus medullaris and cauda equina: Conus extends to the L1 level. Conus and cauda equina appear normal.   Paraspinal and other soft tissues: Unremarkable.   Disc levels:   T12-L1: Minimal disc bulge.  No canal or foraminal stenosis.    L1-L2: Disc bulge with superimposed left foraminal protrusion and endplate osteophytic ridging. Minor facet arthropathy. No canal stenosis. No right foraminal stenosis. Mild to moderate left foraminal stenosis.   L2-L3: Disc bulge slightly eccentric to the right. Moderate facet arthropathy. No canal stenosis. Minor effacement of the right subarticular recess. No foraminal stenosis.   L3-L4: Disc bulge with superimposed right foraminal/far lateral protrusion and endplate osteophytic ridging. Moderate facet arthropathy with ligamentum flavum infolding. Mild canal stenosis. Partial effacement of the right subarticular recess. Moderate right and minor left foraminal stenosis.   L4-L5:  Minor facet arthropathy.  No canal or foraminal stenosis.   L5-S1: Anterolisthesis with uncovering of disc bulge with superimposed left foraminal protrusion. Moderate to marked facet arthropathy with ligamentum flavum infolding. No canal stenosis. Minor right and marked left foraminal stenosis.   IMPRESSION: Multilevel degenerative changes as detailed above. Facet arthropathy is greatest at L2-L3, L3-L4, and L5-S1. No high-grade canal stenosis. Foraminal narrowing is greatest on the right at L3-L4 and on the left at L5-S1. Right subarticular recess narrowing is present at L3-L4.     Electronically Signed   By: Guadlupe Spanish M.D.   On: 03/08/2022 12:31   He reports that he quit smoking about 15 years ago. His smoking use included cigarettes. He started smoking about 46 years ago. He has a 52.5 pack-year smoking history. He has been exposed to tobacco smoke. He has never used smokeless tobacco.  Recent Labs    02/14/23 1623 09/23/23 1034  HGBA1C  --  5.8*  LABURIC 4.3  --     Objective:  VS:  HT:    WT:   BMI:     BP:116/75  HR:73bpm  TEMP: ( )  RESP:  Physical Exam Vitals and nursing note reviewed.  HENT:     Head: Normocephalic and atraumatic.     Right Ear: External ear normal.      Left Ear: External ear normal.     Nose: Nose normal.     Mouth/Throat:     Mouth: Mucous membranes are moist.  Eyes:     Extraocular Movements: Extraocular movements intact.  Cardiovascular:     Rate and Rhythm: Normal rate.     Pulses: Normal pulses.  Pulmonary:     Effort: Pulmonary effort is normal.  Abdominal:     General: Abdomen is flat. There is no distension.  Musculoskeletal:        General: Tenderness present.     Cervical back: Normal range of motion.     Comments: Patient rises from seated position to standing without difficulty. Mild pain noted with facet loading and lumbar extension. 5/5 strength noted with bilateral hip flexion, knee flexion/extension, ankle dorsiflexion/plantarflexion and EHL. No clonus noted bilaterally. No pain upon palpation of greater trochanters. No pain with internal/external rotation of bilateral hips. Sensation intact bilaterally. Negative slump test bilaterally. Ambulates without aid, gait steady.     Skin:    General: Skin is warm and dry.     Capillary Refill: Capillary refill takes less than 2 seconds.  Neurological:     General: No focal deficit present.  Mental Status: He is alert and oriented to person, place, and time.  Psychiatric:        Mood and Affect: Mood normal.        Behavior: Behavior normal.     Ortho Exam  Imaging: No results found.  Past Medical/Family/Surgical/Social History: Medications & Allergies reviewed per EMR, new medications updated. 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 Jude Quadra Assura CRT-D   Arthritis    Bilateral inguinal hernia    a.) s/p BILATERAL repair 11/19/2022; b.) recurrent hernia on LEFT   CAD (coronary artery disease) 09/10/2021   a.) R/LHC 09/10/2021: 30% pLCx - med mgmt.   Cardiomegaly    CHF (congestive heart failure) (HCC)    a.) TTE 09/07/2021: EF 25-30%, diff inf HK, mod LV dil, mod LAE, mod-sev MR; b.) R/LHC 09/10/2021: EF 25%, mPA 13, PCWP 5, CO 6.2, CI 3.1; c.) cMRI 09/11/2021: EF 14%, sev LV dil, diff HK; d.) TTE 01/11/2022: EF 25-30%, glob HK, mod LV dil, mod LAE, mild MR, G2DD; e.) CRT-D implanted 04/08/2022; f.) TTE 08/08/2022: EF 30-35%, glob HK, triv MR, G1DD   DDD (degenerative disc disease), lumbar    Emphysema of lung (HCC)    Erectile dysfunction    a.) on PDE5i (sildenafil) PRN   GERD (gastroesophageal reflux disease)    Hemorrhoids    Hepatitis C virus infection cured after antiviral drug therapy    History of substance abuse (HCC)    a.) THC + crack cocaine   HLD (hyperlipidemia)    Hypertension    Hypospadias, balanic    LBBB (left bundle branch block) 04/26/2021   a.) noted on preop ECG 04/26/2021   Long term current use of amiodarone    Lower urinary tract symptoms (LUTS)    NICM (nonischemic cardiomyopathy) (HCC)    a.) TTE 09/07/2021: EF 25-30%; b.) R/LHC 09/10/2021: mPA 13, PCWP 5, CO 6.2, CI 3.1; c.) cMRI 09/11/2021: EF 14%, diff HK, RV insertion site LGE; d.) TTE 01/11/2022: EF 25-30% e.) CRT-D implanted 04/08/2022; f.) TTE 08/08/2022: EF 30-35%    Pre-diabetes    Prostate cancer (HCC) 01/08/2021   a.) Gleason 3+4, T2a; b.) s/p brachytherapy 06/05/2021   Umbilical hernia    a.) s/p repair 11/19/2022   Family History  Problem Relation Age of Onset   COPD Mother    Stroke Father    Lung cancer Maternal Aunt    Diabetes Maternal Uncle    Brain cancer Maternal Uncle    Lung cancer Maternal Uncle    Lung cancer Maternal Uncle    Cancer Paternal Grandmother    Diabetes Paternal Grandfather    Colon cancer Neg Hx    Esophageal cancer Neg  Hx    Stomach cancer Neg Hx    Pancreatic cancer Neg Hx    Colon polyps Neg Hx    Rectal cancer Neg Hx    Past Surgical History:  Procedure Laterality Date   BIOPSY PROSTATE     BIV ICD INSERTION CRT-D N/A 04/08/2022   Procedure: BIV ICD INSERTION CRT-D;  Surgeon: Duke Salvia, MD;  Location: [facility] INVASIVE CV LAB;  Service: Cardiovascular;  Laterality: N/A;   CIRCUMCISION N/A 06/05/2021   Procedure: CIRCUMCISION ADULT;  Surgeon: Bjorn Pippin, MD;  Location: [facility];  Service: Urology;  Laterality: N/A;   COLONOSCOPY W/ POLYPECTOMY     HEMORRHOID SURGERY     HERNIA REPAIR     Had 3 fixed, had re-repair the left groin hernia.   HYPOSPADIAS CORRECTION     INSERTION OF MESH  11/19/2022   Procedure: INSERTION OF MESH;  Surgeon: Leafy Ro, MD;  Location: [facility];  Service: General;;   RADIOACTIVE SEED IMPLANT N/A 06/05/2021   Procedure: RADIOACTIVE SEED IMPLANT/BRACHYTHERAPY IMPLANT;  Surgeon: Bjorn Pippin, MD;  Location: [facility];  Service: Urology;  Laterality: N/A;   RIGHT/LEFT HEART CATH AND CORONARY ANGIOGRAPHY N/A 09/10/2021   Procedure: RIGHT/LEFT HEART CATH AND CORONARY ANGIOGRAPHY;  Surgeon: Dolores Patty, MD;  Location: [facility] INVASIVE CV LAB;  Service: Cardiovascular;  Laterality: N/A;   SPACE OAR INSTILLATION N/A 06/05/2021   Procedure: SPACE OAR INSTILLATION;  Surgeon: Bjorn Pippin, MD;  Location: [facility];  Service:  Urology;  Laterality: N/A;   UMBILICAL HERNIA REPAIR N/A 11/19/2022   Procedure: HERNIA REPAIR UMBILICAL ADULT;  Surgeon: Leafy Ro, MD;  Location: [facility];  Service: General;  Laterality: N/A;   Social History   Occupational History   Occupation: [employer]    Comment: Estate agent  Tobacco Use   Smoking status: Former    Current packs/day: 0.00    Average packs/day: 1.8 packs/day for 30.0 years (52.5 ttl pk-yrs)    Types: Cigarettes    Start date: 59    Quit date: 09/13/2008    Years since quitting: 15.2    Passive exposure: Past   Smokeless tobacco: Never  Vaping Use   Vaping status: Never Used  Substance and Sexual Activity   Alcohol use: Not Currently   Drug use: Not Currently    Types: "Crack" cocaine, Marijuana   Sexual activity: Not Currently

## 2023-12-11 NOTE — Progress Notes (Unsigned)
 Pain Score:   8

## 2023-12-15 DIAGNOSIS — N3281 Overactive bladder: Secondary | ICD-10-CM | POA: Insufficient documentation

## 2023-12-15 NOTE — Assessment & Plan Note (Signed)
 Followed by [facility] rheumatology.  Remains on methotrexate and folic acid supplementation.  Symptoms are adequately controlled.

## 2023-12-15 NOTE — Assessment & Plan Note (Signed)
Valtrex refilled today 

## 2023-12-15 NOTE — Assessment & Plan Note (Signed)
 Lipid panel updated in December.  Total cholesterol 132 and LDL 62.  He remains on rosuvastatin 10 mg daily.  No medication changes are indicated at this time.

## 2023-12-15 NOTE — Assessment & Plan Note (Signed)
 Remains adequately controlled on current antihypertensive regimen.

## 2023-12-15 NOTE — Assessment & Plan Note (Signed)
 Remains euvolemic on exam.  Recently seen by cardiology for follow-up.  On appropriate GDMT.  No medication changes are indicated today.

## 2023-12-15 NOTE — Assessment & Plan Note (Signed)
 Quit 2009.  Accumulated a 52.5-pack-year smoking history.  Last underwent LDCT in February 2024.  Repeat LDCT recommended for 12 months.  Will message lung cancer screening program.

## 2023-12-15 NOTE — Assessment & Plan Note (Signed)
 Followed by urology (Dr. Annabell Howells).  Recently switched to Vesicare, which has been effective.

## 2023-12-15 NOTE — Assessment & Plan Note (Signed)
 A1c has improved to 5.8 from 6.3 previously.  He remains on Farxiga.  He was congratulated on his progress.

## 2023-12-16 ENCOUNTER — Other Ambulatory Visit (HOSPITAL_COMMUNITY): Payer: Self-pay

## 2023-12-16 ENCOUNTER — Telehealth (HOSPITAL_COMMUNITY): Payer: Self-pay

## 2023-12-16 NOTE — Telephone Encounter (Signed)
 Advanced [facility] Patient Advocate Encounter  Prior authorization for Marcelline Deist has been submitted and approved. Test billing returns $4 for 90 day supply.  Key: [id number] Effective: 12/16/2023 to 12/15/2024  Burnell Blanks, CPhT Rx Patient Advocate Phone: [phone]

## 2023-12-18 ENCOUNTER — Other Ambulatory Visit: Payer: Self-pay | Admitting: Physical Medicine and Rehabilitation

## 2023-12-18 DIAGNOSIS — M47816 Spondylosis without myelopathy or radiculopathy, lumbar region: Secondary | ICD-10-CM

## 2023-12-18 DIAGNOSIS — G8929 Other chronic pain: Secondary | ICD-10-CM

## 2023-12-22 ENCOUNTER — Encounter: Payer: Self-pay | Admitting: Surgery

## 2023-12-22 ENCOUNTER — Ambulatory Visit (INDEPENDENT_AMBULATORY_CARE_PROVIDER_SITE_OTHER): Admitting: Surgery

## 2023-12-22 ENCOUNTER — Ambulatory Visit
Admission: RE | Admit: 2023-12-22 | Discharge: 2023-12-22 | Disposition: A | Discharge status: Home / Self Care | Source: Ambulatory Visit | Attending: Physical Medicine and Rehabilitation | Admitting: Physical Medicine and Rehabilitation

## 2023-12-22 ENCOUNTER — Telehealth (HOSPITAL_COMMUNITY): Payer: Self-pay

## 2023-12-22 VITALS — BP 137/74 | HR 70 | Temp 98.5°F | Ht 68.0 in | Wt 167.8 lb

## 2023-12-22 DIAGNOSIS — R1032 Left lower quadrant pain: Secondary | ICD-10-CM | POA: Diagnosis not present

## 2023-12-22 DIAGNOSIS — M48061 Spinal stenosis, lumbar region without neurogenic claudication: Secondary | ICD-10-CM | POA: Diagnosis not present

## 2023-12-22 DIAGNOSIS — M5136 Other intervertebral disc degeneration, lumbar region with discogenic back pain only: Secondary | ICD-10-CM | POA: Diagnosis not present

## 2023-12-22 DIAGNOSIS — M545 Low back pain, unspecified: Secondary | ICD-10-CM

## 2023-12-22 DIAGNOSIS — M47816 Spondylosis without myelopathy or radiculopathy, lumbar region: Secondary | ICD-10-CM

## 2023-12-22 MED ORDER — TRIAMCINOLONE ACETONIDE 40 MG/ML IJ SUSP
40.0000 mg | Freq: Once | INTRAMUSCULAR | Status: AC
  Administered 2023-12-22: 40 mg via INTRAMUSCULAR

## 2023-12-22 NOTE — Telephone Encounter (Signed)
 Called and spoke to pt's mother Hilda Lias to confirm/remind patient of their appointment [facility].   And reminded to bring in all medications and/or complete list.  Confirmed patient has transportation. Gave directions, instructed to utilize valet parking.  Confirmed appointment prior to ending call.

## 2023-12-22 NOTE — Patient Instructions (Signed)
 Selective Nerve Root Block, Care After The following information offers guidance on how to care for yourself after your procedure. Your health care provider may also give you more specific instructions. If you have problems or questions, contact your health care provider. What can I expect after the procedure? After the procedure, it is common to feel some discomfort at the injection (puncture) site. The injection may temporarily reduce or stop pain in the part of the body that is supplied by the nerve. You may have a temporary increase in blood sugar if steroids were used with the injection. Follow these instructions at home: Activity If you were given a sedative during the procedure, it can affect you for several hours. Do not drive or operate machinery until your health care provider says that it is safe. Do not lift anything that is heavier than 10 lb (4.5 kg), or the limit that you are told, until your health care provider says that it is safe. Return to your normal activities as told by your health care provider. Ask your health care provider what activities are safe for you. Injection site care Take off your bandage (dressing) 24 hours after your procedure, or as told by your health care provider. Check your injection site every day for signs of infection. Check for: Redness, swelling, or pain. Warmth. Fluid or blood. Pus or a bad smell. If directed, put ice on the affected area. To do this: Put ice in a plastic bag. Place a towel between your skin and the bag. Leave the ice on for 20 minutes, 2-3 times a day. Remove the ice if your skin turns bright red. This is very important. If you cannot feel pain, heat, or cold, you have a greater risk of damage to the area. General instructions  Take over-the-counter and prescription medicines only as told by your health care provider. Do not take baths, swim, or use a hot tub until your health care provider approves. Ask your health care  provider if you may take showers. You may only be allowed to take sponge baths. In most cases, you may shower and bathe starting 24 hours after your procedure. Keep all follow-up visits. This is important. Contact a health care provider if: You have more redness, swelling, or pain around your injection site. You have fluid or blood coming from your injection site. You have pus or a bad smell coming from your injection site. Your injection site feels warm to the touch. You have a fever. You have diabetes and your blood sugar remains above 180 mg/dL. Get help right away if: You have: Shortness of breath. A severe headache. Pain that gets worse. You develop weakness or loss of feeling (numbness). You develop muscle weakness or a loss of movement (paralysis). Summary Do not drive or operate machinery until your health care provider says that it is safe. Check your injection site every day for signs of infection. Return to your normal activities as told. Ask your health care provider what activities are safe for you. Take over-the-counter and prescription medicines only as told by your health care provider. This information is not intended to replace advice given to you by your health care provider. Make sure you discuss any questions you have with your health care provider. Document Revised: 05/25/2021 Document Reviewed: 05/25/2021 Elsevier Patient Education  2024 [employer].

## 2023-12-23 ENCOUNTER — Ambulatory Visit (HOSPITAL_COMMUNITY)
Admission: RE | Admit: 2023-12-23 | Discharge: 2023-12-23 | Disposition: A | Discharge status: Home / Self Care | Payer: Commercial Managed Care - HMO | Source: Ambulatory Visit | Attending: Family Medicine | Admitting: Family Medicine

## 2023-12-23 ENCOUNTER — Encounter (HOSPITAL_COMMUNITY): Payer: Self-pay

## 2023-12-23 VITALS — BP 120/80 | HR 63 | Wt 169.4 lb

## 2023-12-23 DIAGNOSIS — I340 Nonrheumatic mitral (valve) insufficiency: Secondary | ICD-10-CM | POA: Insufficient documentation

## 2023-12-23 DIAGNOSIS — Z4502 Encounter for adjustment and management of automatic implantable cardiac defibrillator: Secondary | ICD-10-CM | POA: Insufficient documentation

## 2023-12-23 DIAGNOSIS — I428 Other cardiomyopathies: Secondary | ICD-10-CM | POA: Insufficient documentation

## 2023-12-23 DIAGNOSIS — Z87891 Personal history of nicotine dependence: Secondary | ICD-10-CM | POA: Diagnosis not present

## 2023-12-23 DIAGNOSIS — K219 Gastro-esophageal reflux disease without esophagitis: Secondary | ICD-10-CM | POA: Insufficient documentation

## 2023-12-23 DIAGNOSIS — Z5986 Financial insecurity: Secondary | ICD-10-CM | POA: Insufficient documentation

## 2023-12-23 DIAGNOSIS — I110 Hypertensive heart disease with heart failure: Secondary | ICD-10-CM | POA: Insufficient documentation

## 2023-12-23 DIAGNOSIS — I5022 Chronic systolic (congestive) heart failure: Secondary | ICD-10-CM | POA: Diagnosis not present

## 2023-12-23 DIAGNOSIS — Z8546 Personal history of malignant neoplasm of prostate: Secondary | ICD-10-CM | POA: Insufficient documentation

## 2023-12-23 DIAGNOSIS — R0683 Snoring: Secondary | ICD-10-CM | POA: Insufficient documentation

## 2023-12-23 DIAGNOSIS — Z79899 Other long term (current) drug therapy: Secondary | ICD-10-CM | POA: Diagnosis not present

## 2023-12-23 DIAGNOSIS — I2510 Atherosclerotic heart disease of native coronary artery without angina pectoris: Secondary | ICD-10-CM | POA: Diagnosis not present

## 2023-12-23 DIAGNOSIS — I447 Left bundle-branch block, unspecified: Secondary | ICD-10-CM | POA: Insufficient documentation

## 2023-12-23 DIAGNOSIS — R7303 Prediabetes: Secondary | ICD-10-CM | POA: Insufficient documentation

## 2023-12-23 DIAGNOSIS — I493 Ventricular premature depolarization: Secondary | ICD-10-CM | POA: Insufficient documentation

## 2023-12-23 DIAGNOSIS — J449 Chronic obstructive pulmonary disease, unspecified: Secondary | ICD-10-CM | POA: Diagnosis not present

## 2023-12-23 DIAGNOSIS — J439 Emphysema, unspecified: Secondary | ICD-10-CM | POA: Diagnosis not present

## 2023-12-23 LAB — BASIC METABOLIC PANEL
Anion gap: 9 (ref 5–15)
BUN: 13 mg/dL (ref 6–20)
CO2: 25 mmol/L (ref 22–32)
Calcium: 9.9 mg/dL (ref 8.9–10.3)
Chloride: 104 mmol/L (ref 98–111)
Creatinine, Ser: 0.81 mg/dL (ref 0.61–1.24)
GFR, Estimated: >60 mL/min (ref >60)
Glucose, Bld: 108 mg/dL — ABNORMAL HIGH (ref 70–99)
Potassium: 4.3 mmol/L (ref 3.5–5.1)
Sodium: 138 mmol/L (ref 135–145)

## 2023-12-23 NOTE — Progress Notes (Signed)
 [facility] Note   Primary Care: Billie Lade, MD EP: Dr. Graciela Husbands HF Cardiologist: Dr. 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  He was seen by Dr Allyson Sabal 04/2021 for preop eval before radioactive seed implant and circumcision. He had LBBB of unknown duration, BP well-controlled. No further workup pre-procedure.  Admitted 11/22 for a/c systolic HF. Echo EF 20-25%. LV dilated (6.5cm) with severe central MR. Global HK with ? AK of anteroseptum.  Diuresed with IV lasix. Initiated on GDMT with digoxin, spironolactone, Entresto and Farxiga. Frequent PVCs, started on amio w/ reduction in PVC burden. R/LHC showed minimal nonobstructive CAD, cMRI showed LVEF 14%, diffuse HK, mild/mod RV dilation w/ severe systolic dysfunction, RVEF 15%, small nonspecific basal inferoseptal RV insertion site LGE, mild elevation in ECV, not suggestive of cardiac amyloidosis. 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 LifeVest placed  Home sleep study 12/22 AHI 0.6/hr  Echo 01/11/22: EF 25-30% LV markedly dilated. Severe dyssynchrony. RV normal. Mild to moderate MR. Referred to EP for CRT-D.   Underwent CRT-D implant 6/23.   Follow up 10/23, still with NYHA II-III symptoms despite CRT-D, volume ok. Echo repeated, considering CPX if no better. Amio decreased to 100 daily.  Echo (10/23): EF 30-35%, moderate LV dysfunction with global HK, grade I DD, RV ok  S/p laparoscopic bilateral inguinal hernia repair and open ventral hernia repair (2/24). Follow up 2/24, felt like he had more energy after CRT-D. NYHA II and volume OK. CPX arranged which showed mild HF limitation.  We saw him in 5/24 for pre-op clearance for hernia surgery. 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 Bedside echo 30%. He  was cleared. GDMT restarted.   S/p uncomplicated hernia surgery 03/13/23.   Echo (9/24): EF 30-35%, RV ok, mild to moderate MR  Today he returns for HF follow up with his mother, with whom he lives. Overall feeling fine. Has episodes of dyspnea unrelated to increases in physical exertion. Able to walk up steps but has to take it easy. Has bendopnea when bending over to pet his [place], [place]. Denies palpitations, abnormal bleeding, CP, dizziness, edema, or PND/Orthopnea. Appetite ok. No fever or chills. Weight at home 165 pounds. Taking all medications. Unable to work as Estate agent, asking about disability.   Cardiac Studies:  - Echo (9/24): EF 30-35%, RV ok, mild to moderate MR  - Zio (7/24): 4.8% PVCs  - CPX (4/24):  Pre-Exercise PFTs: FVC 3.64 (81%)      FEV1 2.77 (79%)        FEV1/FVC 76 (97%)        MVV 99 (72%)   Peak VO2: 28.4 (87% predicted peak VO2)  VE/VCO2 slope:  37  OUES: 2.58  Peak RER: 1.12  Ventilatory Threshold: 19.8 (57% predicted or measured peak VO2)  Peak RR 45  Peak Ventilation:  100.9  VE/MVV:  102%  PETCO2 at peak:  30   - Echo (10/23): EF 30-35%, moderate LV dysfunction with global HK, grade I DD, RV ok  - Echo (3/23): EF 25-30%, LV markedly dilated, severe dyssynchrony, RV ok, mild to moderate MR.  - Cardiac MRI (11/22):  LVEF 14%, diffuse HK, mild/mod RV dilation w/ severe systolic dysfunction, RVEF 15%, small nonspecific basal inferoseptal  RV insertion site LGE, mild elevation in ECV, not suggestive of cardiac amyloidosis.   - Echo (11/22): EF 20-25%. LV dilated (6.5cm) with severe central MR. Global HK with ? AK of anteroseptum   - RHC/LHC (11/22) with minimal obstructive CAD, low filling pressure, normal output and severe NICM   ROS: All systems reviewed and negative except as per HPI.   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 Jude Quadra Assura CRT-D    Arthritis    Bilateral inguinal hernia    a.) s/p BILATERAL repair 11/19/2022; b.) recurrent hernia on LEFT   CAD (coronary artery disease) 09/10/2021   a.) R/LHC 09/10/2021: 30% pLCx - med mgmt.   Cardiomegaly    CHF (congestive heart failure) (HCC)    a.) TTE 09/07/2021: EF 25-30%, diff inf HK, mod LV dil, mod LAE, mod-sev MR; b.) R/LHC 09/10/2021: EF 25%, mPA 13, PCWP 5, CO 6.2, CI 3.1; c.) cMRI 09/11/2021: EF 14%, sev LV dil, diff HK; d.) TTE 01/11/2022: EF 25-30%, glob HK, mod LV dil, mod LAE, mild MR, G2DD; e.) CRT-D implanted 04/08/2022; f.) TTE 08/08/2022: EF 30-35%, glob HK, triv MR, G1DD   DDD (degenerative disc disease), lumbar    Emphysema of lung (HCC)    Erectile dysfunction    a.) on PDE5i (sildenafil) PRN   GERD (gastroesophageal reflux disease)    Hemorrhoids    Hepatitis C virus infection cured after antiviral drug therapy    History of substance abuse (HCC)    a.) THC + crack cocaine   HLD (hyperlipidemia)    Hypertension    Hypospadias, balanic    LBBB (left bundle branch block) 04/26/2021   a.) noted on preop ECG 04/26/2021   Long term current use of amiodarone    Lower urinary tract symptoms (LUTS)    NICM (nonischemic cardiomyopathy) (HCC)    a.) TTE 09/07/2021: EF 25-30%; b.) R/LHC 09/10/2021: mPA 13, PCWP 5, CO 6.2, CI 3.1; c.) cMRI 09/11/2021: EF 14%, diff HK, RV insertion site LGE; d.) TTE 01/11/2022: EF 25-30% e.) CRT-D implanted 04/08/2022; f.) TTE 08/08/2022: EF 30-35%   Pre-diabetes    Prostate cancer (HCC) 01/08/2021   a.) Gleason 3+4, T2a; b.) s/p brachytherapy 06/05/2021   Umbilical hernia    a.) s/p repair 11/19/2022   Current Outpatient Medications  Medication Sig Dispense Refill   carvedilol (COREG) 6.25 MG tablet Take 1 tablet (6.25 mg total) by mouth 2 (two) times daily with a meal. 180 tablet 3   CINNAMON PO Take 2 tablets by mouth daily.     FARXIGA 10 MG TABS tablet Take 1 tablet (10 mg total) by mouth daily. 30 tablet 11   folic acid  (FOLVITE) 1 MG tablet Take 1 mg by mouth daily.     methotrexate (RHEUMATREX) 2.5 MG tablet Take 15 mg by mouth once a week. On Thursdays     Multiple Vitamin (MULTIVITAMIN ADULT PO) Take by mouth daily.     omega-3 acid ethyl esters (LOVAZA) 1 g capsule Take 1 g by mouth 2 (two) times daily.     rosuvastatin (CRESTOR) 10 MG tablet Take 1 tablet (10 mg total) by mouth daily. 90 tablet 3   sacubitril-valsartan (ENTRESTO) 24-26 MG Take 1 tablet by mouth 2 (two) times daily. 180 tablet 3   solifenacin (VESICARE) 5 MG tablet Take 1 tablet (5 mg total) by mouth daily. 30 tablet 11   spironolactone (ALDACTONE) 25 MG tablet Take  1 tablet (25 mg total) by mouth daily. 90 tablet 3   TURMERIC CURCUMIN PO Take 2 capsules by mouth daily.     valACYclovir (VALTREX) 500 MG tablet Take 1 tablet (500 mg total) by mouth 2 (two) times daily as needed. 20 tablet 2   No current facility-administered medications for this encounter.   No Known Allergies  Social History   Socioeconomic History   Marital status: Single    Spouse name: Not on file   Number of children: 1   Years of education: Not on file   Highest education level: GED or equivalent  Occupational History   Occupation: [employer]    Comment: Estate agent  Tobacco Use   Smoking status: Former    Current packs/day: 0.00    Average packs/day: 1.8 packs/day for 30.0 years (52.5 ttl pk-yrs)    Types: Cigarettes    Start date: 72    Quit date: 09/13/2008    Years since quitting: 15.2    Passive exposure: Past   Smokeless tobacco: Never  Vaping Use   Vaping status: Never Used  Substance and Sexual Activity   Alcohol use: Not Currently   Drug use: Not Currently    Types: "Crack" cocaine, Marijuana   Sexual activity: Not Currently  Other Topics Concern   Not on file  Social History Narrative   Lives with mother   Social Drivers of Health   Financial Resource Strain: Medium Risk (11/20/2023)   Overall Financial Resource  Strain (CARDIA)    Difficulty of Paying Living Expenses: Somewhat hard  Food Insecurity: No Food Insecurity (11/20/2023)   Hunger Vital Sign    Worried About Running Out of Food in the Last Year: Never true    Ran Out of Food in the Last Year: Never true  Transportation Needs: No Transportation Needs (11/20/2023)   PRAPARE - Administrator, Civil Service (Medical): No    Lack of Transportation (Non-Medical): No  Physical Activity: Unknown (11/20/2023)   Exercise Vital Sign    Days of Exercise per Week: Patient declined    Minutes of Exercise per Session: Not on file  Stress: No Stress Concern Present (11/20/2023)   [employer] of Occupational Health - Occupational Stress Questionnaire    Feeling of Stress : Not at all  Social Connections: Socially Isolated (11/20/2023)   Social Connection and Isolation Panel [NHANES]    Frequency of Communication with Friends and Family: Never    Frequency of Social Gatherings with Friends and Family: Once a week    Attends Religious Services: Never    Database administrator or Organizations: No    Attends Engineer, structural: Not on file    Marital Status: Divorced  Intimate Partner Violence: Not At Risk (07/27/2021)   Humiliation, Afraid, Rape, and Kick questionnaire    Fear of Current or Ex-Partner: No    Emotionally Abused: No    Physically Abused: No    Sexually Abused: No   Family History  Problem Relation Age of Onset   COPD Mother    Stroke Father    Lung cancer Maternal Aunt    Diabetes Maternal Uncle    Brain cancer Maternal Uncle    Lung cancer Maternal Uncle    Lung cancer Maternal Uncle    Cancer Paternal Grandmother    Diabetes Paternal Grandfather    Colon cancer Neg Hx    Esophageal cancer Neg Hx    Stomach cancer Neg Hx  Pancreatic cancer Neg Hx    Colon polyps Neg Hx    Rectal cancer Neg Hx    BP 120/80   Pulse 63   Wt 76.8 kg (169 lb 6.4 oz)   SpO2 97%   BMI 25.76 kg/m   Wt Readings from  Last 3 Encounters:  12/23/23 76.8 kg (169 lb 6.4 oz)  12/22/23 76.1 kg (167 lb 12.8 oz)  11/26/23 76.3 kg (168 lb 3.2 oz)   PHYSICAL EXAM: General:  NAD. No resp difficulty, walked into clinic HEENT: Normal Neck: Supple. No JVD. Cor: Regular rate & rhythm. No rubs, gallops or murmurs. Lungs: Clear Abdomen: Soft, nontender, nondistended.  Extremities: No cyanosis, clubbing, rash, edema Neuro: Alert & oriented x 3, moves all 4 extremities w/o difficulty. Affect pleasant.  Device interrogation (personally reviewed): 92% BiV pacing, no VT or AF, 5% PVC burden, CorVue stable.  ASSESSMENT & PLAN:  1. Chronic Systolic HF due to NICM - Echo (11/22): EF 20-25%. LV dilated (6.5cm) with severe central MR. Global HK with ? AK of anteroseptum  - RHC/LHC (11/22) with minimal obstructive CAD, low filling pressure, normal output and severe NICM - cMRI (11/22): LVEF 14%, diffuse HK, mild/mod RV dilation w/ severe systolic dysfunction, RVEF 15%, small nonspecific basal inferoseptal RV insertion site LGE, mild elevation in ECV, not suggestive of cardiac amyloidosis. - Echo (3/23): EF 25-30% LV markedly dilated. Severe dyssynchrony. RV normal. Mild to moderate MR  - Suspected LBBB CMP (+/- PVC CM), now s/p CRT-D 6/23 - Echo (10/23): EF 30-35%, RV ok - CPX (4/24) showed mild HF limitation, slope 37 - Bedside echo (03/07/23): EF 30% - Echo (06/24/23): EF 30-35% - NYHA II, volume OK on exam and by CorVue - Continue Lasix 20 mg PRN, has not needed recently - Continue Entresto 24/26 mg bid. - Continue Coreg 6.25 mg bid. - Continue Farxiga 10 mg daily. - Continue spironolactone 25 mg daily. - Labs today. - Device interrogation as above. He is past due for follow up with EP, will reach out to schedulers to arrange.  2. Frequent PVCs - Previously on amiodarone, now stopped.  - Appropriate BiV pacing on ICD - Sleep study normal - Zio (7/24): 4.8% PVCs - Device interrogation today with 5% PVCs  - Labs  today  3. Severe functional mitral regurgitation - Mild to moderate MR on echo 3/23. - Trivial MR on echo 9/24 - Follow   4. COPD - Quit smoking 2009. - Stable, no change.   5. Pre-Diabetes - A1C 5.8 (12/24) - Continue Farxiga - per PCP   6. H/o prostate CA - s/p radioactive seeds - Follows with urology   7. Snoring - Home sleep study (12/22) AHI 0.6/hr  Follow up in 3 months with Dr. Gala Romney. Will have HFSW reach out to him regarding disability.  Jacklynn Ganong, FNP  9:55 AM

## 2023-12-23 NOTE — Progress Notes (Signed)
 Outpatient Surgical Follow Up    Ruben Duffy is an 58 y.o. male.   Chief Complaint  Patient presents with   Follow-up    Left inguinal pain    HPI: Ruben Duffy is 58 year old male 1 year and 1 month out from his original bilateral inguinal hernia repair with mesh.  He did have a recurrence 3 months afterwards.  We had to go back and read did the left side robotically.  He reports some intermittent left inguinal pain and he seems to feel a bulge.  The pain seems to be worsening when doing some activities.  The pain actually started after he was pushing his mother in a wheelchair.  He denies any trauma.  No fevers no chills.  He does have a significant cardiac history HE  Does have ICD and  EF 30%. No recent cardiac issues. Did have a recent CT of the lung for lung screening cancer that have personally reviewed showing evidence of COPD but without any lung masses Has tried some gabapentin without any improvement of his pain. In addition to that he did have evidence of chronic back pain and is currently being worked up    Past Medical History:  Diagnosis Date   AICD (automatic cardioverter/defibrillator) present 04/08/2022   a.) St. Jude Quadra Assura CRT-D   Arthritis    Bilateral inguinal hernia    a.) s/p BILATERAL repair 11/19/2022; b.) recurrent hernia on LEFT   CAD (coronary artery disease) 09/10/2021   a.) R/LHC 09/10/2021: 30% pLCx - med mgmt.   Cardiomegaly    CHF (congestive heart failure) (HCC)    a.) TTE 09/07/2021: EF 25-30%, diff inf HK, mod LV dil, mod LAE, mod-sev MR; b.) R/LHC 09/10/2021: EF 25%, mPA 13, PCWP 5, CO 6.2, CI 3.1; c.) cMRI 09/11/2021: EF 14%, sev LV dil, diff HK; d.) TTE 01/11/2022: EF 25-30%, glob HK, mod LV dil, mod LAE, mild MR, G2DD; e.) CRT-D implanted 04/08/2022; f.) TTE 08/08/2022: EF 30-35%, glob HK, triv MR, G1DD   DDD (degenerative disc disease), lumbar    Emphysema of lung (HCC)    Erectile dysfunction    a.) on PDE5i (sildenafil) PRN   GERD  (gastroesophageal reflux disease)    Hemorrhoids    Hepatitis C virus infection cured after antiviral drug therapy    History of substance abuse (HCC)    a.) THC + crack cocaine   HLD (hyperlipidemia)    Hypertension    Hypospadias, balanic    LBBB (left bundle branch block) 04/26/2021   a.) noted on preop ECG 04/26/2021   Long term current use of amiodarone    Lower urinary tract symptoms (LUTS)    NICM (nonischemic cardiomyopathy) (HCC)    a.) TTE 09/07/2021: EF 25-30%; b.) R/LHC 09/10/2021: mPA 13, PCWP 5, CO 6.2, CI 3.1; c.) cMRI 09/11/2021: EF 14%, diff HK, RV insertion site LGE; d.) TTE 01/11/2022: EF 25-30% e.) CRT-D implanted 04/08/2022; f.) TTE 08/08/2022: EF 30-35%   Pre-diabetes    Prostate cancer (HCC) 01/08/2021   a.) Gleason 3+4, T2a; b.) s/p brachytherapy 06/05/2021   Umbilical hernia    a.) s/p repair 11/19/2022    Past Surgical History:  Procedure Laterality Date   BIOPSY PROSTATE     BIV ICD INSERTION CRT-D N/A 04/08/2022   Procedure: BIV ICD INSERTION CRT-D;  Surgeon: Duke Salvia, MD;  Location: [facility] INVASIVE CV LAB;  Service: Cardiovascular;  Laterality: N/A;   CIRCUMCISION N/A 06/05/2021   Procedure: CIRCUMCISION ADULT;  Surgeon:  Bjorn Pippin, MD;  Location: [facility];  Service: Urology;  Laterality: N/A;   COLONOSCOPY W/ POLYPECTOMY     HEMORRHOID SURGERY     HERNIA REPAIR     Had 3 fixed, had re-repair the left groin hernia.   HYPOSPADIAS CORRECTION     INSERTION OF MESH  11/19/2022   Procedure: INSERTION OF MESH;  Surgeon: Leafy Ro, MD;  Location: [facility];  Service: General;;   RADIOACTIVE SEED IMPLANT N/A 06/05/2021   Procedure: RADIOACTIVE SEED IMPLANT/BRACHYTHERAPY IMPLANT;  Surgeon: Bjorn Pippin, MD;  Location: [facility];  Service: Urology;  Laterality: N/A;   RIGHT/LEFT HEART CATH AND CORONARY ANGIOGRAPHY N/A 09/10/2021   Procedure: RIGHT/LEFT HEART CATH AND CORONARY ANGIOGRAPHY;  Surgeon: Dolores Patty,  MD;  Location: [facility] INVASIVE CV LAB;  Service: Cardiovascular;  Laterality: N/A;   SPACE OAR INSTILLATION N/A 06/05/2021   Procedure: SPACE OAR INSTILLATION;  Surgeon: Bjorn Pippin, MD;  Location: [facility];  Service: Urology;  Laterality: N/A;   UMBILICAL HERNIA REPAIR N/A 11/19/2022   Procedure: HERNIA REPAIR UMBILICAL ADULT;  Surgeon: Leafy Ro, MD;  Location: [facility];  Service: General;  Laterality: N/A;    Family History  Problem Relation Age of Onset   COPD Mother    Stroke Father    Lung cancer Maternal Aunt    Diabetes Maternal Uncle    Brain cancer Maternal Uncle    Lung cancer Maternal Uncle    Lung cancer Maternal Uncle    Cancer Paternal Grandmother    Diabetes Paternal Grandfather    Colon cancer Neg Hx    Esophageal cancer Neg Hx    Stomach cancer Neg Hx    Pancreatic cancer Neg Hx    Colon polyps Neg Hx    Rectal cancer Neg Hx     Social History:  reports that he quit smoking about 15 years ago. His smoking use included cigarettes. He started smoking about 46 years ago. He has a 52.5 pack-year smoking history. He has been exposed to tobacco smoke. He has never used smokeless tobacco. He reports that he does not currently use alcohol. He reports that he does not currently use drugs after having used the following drugs: "Crack" cocaine and Marijuana.  Allergies: No Known Allergies  Medications reviewed.    ROS Full ROS performed and is otherwise negative other than what is stated in HPI   BP 137/74   Pulse 70   Temp 98.5 F (36.9 C) (Oral)   Ht 5\' 8"  (1.727 m)   Wt 167 lb 12.8 oz (76.1 kg)   SpO2 96%   BMI 25.51 kg/m   Physical Exam Physical Exam Vitals and nursing note reviewed. Exam conducted with a chaperone present.  Constitutional:      General: He is not in acute distress.    Appearance: Normal appearance.  Eyes:     General: No scleral icterus.       Right eye: No discharge.        Left eye: No discharge.  Abdominal:      General: Abdomen is flat. There is no distension.     Palpations: Abdomen is soft. There is no mass.     Tenderness: There is left inguinal tenderness. There is no guarding or rebound.     Hernia: No hernia is present.     Comments: Mild tenderness in left inguinal area without evidence of hernias, infection hematomas or complications  Musculoskeletal:  General: No swelling or tenderness. Normal range of motion.  Skin:    General: Skin is warm and dry.     Capillary Refill: Capillary refill takes less than 2 seconds.  Neurological:     General: No focal deficit present.     Mental Status: He is alert and oriented to person, place, and time.  Psychiatric:        Mood and Affect: Mood normal.        Behavior: Behavior normal.        Thought Content: Thought content normal.        Judgment: Judgment normal.    Assessment/Plan: Chronic left inguinodynia.  Discussed with the patient in detail that on exam there is no evidence of hernia recurrence.  Potential sources may be related to ilioinguinal nerve issues as well as potential back issues.  He is currently in the process of being evaluated by neurosurgery for his degenerative disease on his spine.  At this point the only thing I can offer is an ilioinguinal nerve block in hopes to alleviate his pain.  He wishes to try this approach.  Please note that I spent 20 minutes in this encounter including personally reviewing medical records, personally reviewing imaging studies coordinating his care and performing documentation.   Procedure note Left ilioinguinal nerve block  Anesthesia: Marcaine quarter percent with epinephrine 10 cc +10 cc of lidocaine 1% with epinephrine and 40 mg of Kenalog  After informed consent was obtained the patient was placed in a supine position and he was prepped and draped in usual sterile fashion.  Using landmarks as the ASIS local anesthetic along with a steroid was used to perform left ilioinguinal  nerve block.  Patient experienced some relief.  No complications.    Sterling Big, MD [facility] General Surgeon

## 2023-12-23 NOTE — Patient Instructions (Addendum)
 Thank you for coming in today  If you had labs drawn today, any labs that are abnormal the clinic will call you No news is good news  You were scheduled a follow up with Dr. Graciela Husbands April 23th, 2025 at 8am  Medications: No changes  Follow up appointments:  Your physician recommends that you schedule a follow-up appointment in:  3 months With Dr. Gala Romney  You will receive a reminder letter in the mail a few months in advance. If you don't receive a letter, please call our office to schedule the follow-up appointment.    Do the following things EVERYDAY: Weigh yourself in the morning before breakfast. Write it down and keep it in a log. Take your medicines as prescribed Eat low salt foods--Limit salt (sodium) to 2000 mg per day.  Stay as active as you can everyday Limit all fluids for the day to less than 2 liters   At the [facility], you and your health needs are our priority. As part of our continuing mission to provide you with exceptional heart care, we have created designated Provider Care Teams. These Care Teams include your primary Cardiologist (physician) and Advanced Practice Providers (APPs- Physician Assistants and Nurse Practitioners) who all work together to provide you with the care you need, when you need it.   You may see any of the following providers on your designated Care Team at your next follow up: Dr Arvilla Meres Dr Marca Ancona Dr. Marcos Eke, NP Robbie Lis, [place] [facility] [place], [place] Brynda Peon, NP Karle Plumber, PharmD   Please be sure to bring in all your medications bottles to every appointment.    Thank you for choosing [facility]  If you have any questions or concerns before your next appointment please send [place] a message through [place] or call our office at [phone].    TO LEAVE A MESSAGE FOR THE NURSE SELECT OPTION 2, PLEASE LEAVE A MESSAGE  INCLUDING: YOUR NAME DATE OF BIRTH CALL BACK NUMBER REASON FOR CALL**this is important as we prioritize the call backs  YOU WILL RECEIVE A CALL BACK THE SAME DAY AS LONG AS YOU CALL BEFORE 4:00 PM

## 2023-12-26 ENCOUNTER — Encounter: Payer: Self-pay | Admitting: Physical Medicine and Rehabilitation

## 2023-12-26 ENCOUNTER — Encounter: Payer: Self-pay | Admitting: Internal Medicine

## 2023-12-26 ENCOUNTER — Encounter: Payer: Self-pay | Admitting: Urology

## 2024-01-06 ENCOUNTER — Ambulatory Visit (INDEPENDENT_AMBULATORY_CARE_PROVIDER_SITE_OTHER): Payer: 59

## 2024-01-06 DIAGNOSIS — I447 Left bundle-branch block, unspecified: Secondary | ICD-10-CM | POA: Diagnosis not present

## 2024-01-06 LAB — CUP PACEART REMOTE DEVICE CHECK
Battery Remaining Longevity: 72 mo
Battery Remaining Percentage: 77 %
Battery Voltage: 2.99 V
Brady Statistic AP VP Percent: 2.3 %
Brady Statistic AP VS Percent: 1 %
Brady Statistic AS VP Percent: 90 %
Brady Statistic AS VS Percent: 2.8 %
Brady Statistic RA Percent Paced: 1 %
Date Time Interrogation Session: 20250325040014
HighPow Impedance: 63 Ohm
HighPow Impedance: 63 Ohm
Implantable Lead Connection Status: 753985
Implantable Lead Connection Status: 753985
Implantable Lead Connection Status: 753985
Implantable Lead Implant Date: 20230626
Implantable Lead Implant Date: 20230626
Implantable Lead Implant Date: 20230626
Implantable Lead Location: 753858
Implantable Lead Location: 753859
Implantable Lead Location: 753860
Implantable Lead Model: 7122
Implantable Pulse Generator Implant Date: 20230626
Lead Channel Impedance Value: 480 Ohm
Lead Channel Impedance Value: 540 Ohm
Lead Channel Impedance Value: 580 Ohm
Lead Channel Pacing Threshold Amplitude: 0.625 V
Lead Channel Pacing Threshold Amplitude: 1 V
Lead Channel Pacing Threshold Amplitude: 1.125 V
Lead Channel Pacing Threshold Pulse Width: 0.5 ms
Lead Channel Pacing Threshold Pulse Width: 0.5 ms
Lead Channel Pacing Threshold Pulse Width: 0.5 ms
Lead Channel Sensing Intrinsic Amplitude: 11.6 mV
Lead Channel Sensing Intrinsic Amplitude: 2.9 mV
Lead Channel Setting Pacing Amplitude: 2 V
Lead Channel Setting Pacing Amplitude: 2 V
Lead Channel Setting Pacing Amplitude: 2 V
Lead Channel Setting Pacing Pulse Width: 0.5 ms
Lead Channel Setting Pacing Pulse Width: 0.5 ms
Lead Channel Setting Sensing Sensitivity: 0.5 mV
Pulse Gen Serial Number: 8949760

## 2024-01-07 ENCOUNTER — Encounter: Payer: Self-pay | Admitting: Internal Medicine

## 2024-01-08 ENCOUNTER — Telehealth (HOSPITAL_COMMUNITY): Payer: Self-pay | Admitting: Licensed Clinical Social Worker

## 2024-01-08 NOTE — Telephone Encounter (Signed)
 H&V Care Navigation CSW Progress Note  Clinical Social Worker consulted to speak with pt regarding disability.  Pt reports he has already called [employer] and has phone appt on April 1st to start application.  Is very prepared and has list of MD visits/tests to provide to case worker to get medical records requests started.  CSW encouraged pt to reach out if he had further questions once process is started.  Assessed if he had any current needs given lack of income.  Pt lives with his mom and so has no rent and gets food stamps and medicaid- reports all his basic needs are met at this time.  Patient is participating in a Managed Medicaid Plan:  Yes  SDOH Screenings   Food Insecurity: No Food Insecurity (11/20/2023)  Housing: Low Risk  (11/20/2023)  Transportation Needs: No Transportation Needs (11/20/2023)  Utilities: Not At Risk (06/24/2023)  Alcohol Screen: Low Risk  (11/20/2023)  Depression (PHQ2-9): Low Risk  (11/26/2023)  Financial Resource Strain: Medium Risk (11/20/2023)  Physical Activity: Unknown (11/20/2023)  Social Connections: Socially Isolated (11/20/2023)  Stress: No Stress Concern Present (11/20/2023)  Tobacco Use: Medium Risk (12/23/2023)  Health Literacy: Adequate Health Literacy (06/24/2023)   Burna Sis, LCSW Clinical Social Worker [facility] Desk#: [phone] Cell#: [phone]

## 2024-01-16 ENCOUNTER — Other Ambulatory Visit: Payer: Self-pay | Admitting: Physical Medicine and Rehabilitation

## 2024-01-16 DIAGNOSIS — M48061 Spinal stenosis, lumbar region without neurogenic claudication: Secondary | ICD-10-CM

## 2024-01-16 DIAGNOSIS — M5416 Radiculopathy, lumbar region: Secondary | ICD-10-CM

## 2024-01-16 NOTE — Progress Notes (Signed)
 Attempted to call patient with results of recent lumbar CT. Left message with family member for him to call back. I also placed order for injection.

## 2024-01-31 ENCOUNTER — Encounter: Payer: Self-pay | Admitting: Internal Medicine

## 2024-02-04 ENCOUNTER — Encounter: Payer: Self-pay | Admitting: Internal Medicine

## 2024-02-04 ENCOUNTER — Ambulatory Visit: Attending: Internal Medicine | Admitting: Internal Medicine

## 2024-02-04 ENCOUNTER — Encounter: Payer: Self-pay | Admitting: *Deleted

## 2024-02-04 VITALS — BP 118/80 | HR 64 | Ht 68.0 in | Wt 166.0 lb

## 2024-02-04 DIAGNOSIS — R002 Palpitations: Secondary | ICD-10-CM | POA: Diagnosis not present

## 2024-02-04 DIAGNOSIS — I5022 Chronic systolic (congestive) heart failure: Secondary | ICD-10-CM | POA: Diagnosis not present

## 2024-02-04 DIAGNOSIS — Z95810 Presence of automatic (implantable) cardiac defibrillator: Secondary | ICD-10-CM | POA: Insufficient documentation

## 2024-02-04 DIAGNOSIS — I428 Other cardiomyopathies: Secondary | ICD-10-CM | POA: Diagnosis not present

## 2024-02-04 DIAGNOSIS — I493 Ventricular premature depolarization: Secondary | ICD-10-CM | POA: Insufficient documentation

## 2024-02-04 DIAGNOSIS — I447 Left bundle-branch block, unspecified: Secondary | ICD-10-CM | POA: Insufficient documentation

## 2024-02-04 NOTE — Progress Notes (Signed)
 Patient enrolled for [employer]/ [employer] to ship a 30 day cardiac event monitor to the patients address on file.

## 2024-02-04 NOTE — Progress Notes (Signed)
 Patient Care Team: Tobi Fortes, MD as PCP - General (Internal Medicine) Avanell Leigh, MD as PCP - Cardiology (Cardiology) Kenith Payer, MD as Consulting Physician (Radiation Oncology) Homero Luster, MD as Attending Physician (Urology) Portia Brittle as Physician Assistant (Hematology and Oncology) Neda Balk, RN as Registered Nurse   HPI  Ruben Duffy is a 58 y.o. 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  There have been no palpitations, lightheadedness or syncope.  Modest chronic dyspnea  Amiodarone  intercurrently discontinued      Event Recorder personnally reviewed  7/24 PVCs 4.8%  DATE TEST EF    11/22 Echo  20-25% MR severe central  11/22 LHC   Non obstructive CAD  11/22 cMRI 14% Biventricular failure    3/23 Echo  25-30% MR mild- mod  9/24 Echo  35%     Date Cr K Hgb TSH LFTs Dig  11/22     13.7      3/23 1.02 4.9    1.47  0.4   7/23 1.33 4.7 14.3  47 (6/23)    12/24    0.75 17   3/25 0.81 4.3                Records and Results Reviewed   Past Medical History:  Diagnosis Date   AICD (automatic cardioverter/defibrillator) present 04/08/2022   a.) St. Jude Quadra Assura CRT-D   Arthritis    Bilateral inguinal hernia    a.) s/p BILATERAL repair 11/19/2022; b.) recurrent hernia on LEFT   CAD (coronary artery disease) 09/10/2021   a.) R/LHC 09/10/2021: 30% pLCx - med mgmt.   Cardiomegaly    CHF (congestive heart failure) (HCC)    a.) TTE 09/07/2021: EF 25-30%, diff inf HK, mod LV dil, mod LAE, mod-sev MR; b.) R/LHC 09/10/2021: EF 25%, mPA 13, PCWP 5, CO 6.2, CI 3.1; c.) cMRI 09/11/2021: EF 14%, sev LV dil, diff HK; d.) TTE 01/11/2022: EF 25-30%, glob HK, mod LV dil, mod LAE, mild  MR, G2DD; e.) CRT-D implanted 04/08/2022; f.) TTE 08/08/2022: EF 30-35%, glob HK, triv MR, G1DD   DDD (degenerative disc disease), lumbar    Emphysema of lung (HCC)    Erectile dysfunction    a.) on PDE5i (sildenafil ) PRN   GERD (gastroesophageal reflux disease)    Hemorrhoids    Hepatitis C virus infection cured after antiviral drug therapy    History of substance abuse (HCC)    a.) THC + crack cocaine   HLD (hyperlipidemia)    Hypertension    Hypospadias, balanic    LBBB (left bundle branch block) 04/26/2021   a.) noted on preop ECG 04/26/2021   Long term current use of amiodarone     Lower urinary tract symptoms (LUTS)    NICM (nonischemic cardiomyopathy) (HCC)    a.) TTE 09/07/2021: EF 25-30%; b.) R/LHC 09/10/2021: mPA 13, PCWP 5, CO 6.2, CI 3.1; c.) cMRI 09/11/2021: EF 14%, diff HK, RV insertion site LGE; d.) TTE 01/11/2022: EF 25-30% e.) CRT-D implanted 04/08/2022; f.) TTE 08/08/2022: EF 30-35%   Pre-diabetes    Prostate cancer (HCC) 01/08/2021   a.) Gleason 3+4, T2a; b.) s/p brachytherapy 06/05/2021   Umbilical hernia    a.) s/p repair 11/19/2022  Past Surgical History:  Procedure Laterality Date   BIOPSY PROSTATE     BIV ICD INSERTION CRT-D N/A 04/08/2022   Procedure: BIV ICD INSERTION CRT-D;  Surgeon: Verona Goodwill, MD;  Location: [facility] INVASIVE CV LAB;  Service: Cardiovascular;  Laterality: N/A;   CIRCUMCISION N/A 06/05/2021   Procedure: CIRCUMCISION ADULT;  Surgeon: Homero Luster, MD;  Location: [facility];  Service: Urology;  Laterality: N/A;   COLONOSCOPY W/ POLYPECTOMY     HEMORRHOID SURGERY     HERNIA REPAIR     Had 3 fixed, had re-repair the left groin hernia.   HYPOSPADIAS CORRECTION     INSERTION OF MESH  11/19/2022   Procedure: INSERTION OF MESH;  Surgeon: Alben Alma, MD;  Location: [facility];  Service: General;;   RADIOACTIVE SEED IMPLANT N/A 06/05/2021   Procedure: RADIOACTIVE SEED IMPLANT/BRACHYTHERAPY IMPLANT;  Surgeon: Homero Luster, MD;   Location: [facility];  Service: Urology;  Laterality: N/A;   RIGHT/LEFT HEART CATH AND CORONARY ANGIOGRAPHY N/A 09/10/2021   Procedure: RIGHT/LEFT HEART CATH AND CORONARY ANGIOGRAPHY;  Surgeon: Mardell Shade, MD;  Location: [facility] INVASIVE CV LAB;  Service: Cardiovascular;  Laterality: N/A;   SPACE OAR INSTILLATION N/A 06/05/2021   Procedure: SPACE OAR INSTILLATION;  Surgeon: Homero Luster, MD;  Location[facility];  Service: Urology;  Laterality: N/A;   UMBILICAL HERNIA REPAIR N/A 11/19/2022   Procedure: HERNIA REPAIR UMBILICAL ADULT;  Surgeon: Alben Alma, MD;  Location: [facility];  Service: General;  Laterality: N/A;    Current Meds  Medication Sig   carvedilol  (COREG ) 6.25 MG tablet Take 1 tablet (6.25 mg total) by mouth 2 (two) times daily with a meal.   CINNAMON PO Take 2 tablets by mouth daily.   FARXIGA  10 MG TABS tablet Take 1 tablet (10 mg total) by mouth daily.   folic acid (FOLVITE) 1 MG tablet Take 1 mg by mouth daily.   Multiple Vitamin (MULTIVITAMIN ADULT PO) Take by mouth daily.   omega-3 acid ethyl esters (LOVAZA) 1 g capsule Take 1 g by mouth 2 (two) times daily.   rosuvastatin  (CRESTOR ) 10 MG tablet Take 1 tablet (10 mg total) by mouth daily.   sacubitril -valsartan  (ENTRESTO ) 24-26 MG Take 1 tablet by mouth 2 (two) times daily.   solifenacin  (VESICARE ) 5 MG tablet Take 1 tablet (5 mg total) by mouth daily.   spironolactone  (ALDACTONE ) 25 MG tablet Take 1 tablet (25 mg total) by mouth daily.   valACYclovir  (VALTREX ) 500 MG tablet Take 1 tablet (500 mg total) by mouth 2 (two) times daily as needed.    No Known Allergies    Review of Systems negative except from HPI and PMH  Physical Exam BP 118/80   Pulse 64   Ht 5\' 8"  (1.727 m)   Wt 166 lb (75.3 kg)   SpO2 97%   BMI 25.24 kg/m  Well developed and well nourished in no acute distress HENT normal Neck supple with JVP-flat Clear Device pocket well healed; without hematoma or  erythema.  There is no tethering  Regular rate and rhythm, no  gallop No  murmur Abd-soft with active BS No Clubbing cyanosis  edema Skin-warm and dry A & Oriented  Grossly normal sensory and motor function  ECG sinus P-synchronous/ AV  pacing  Upright QRS lead 1  Neg QRS lead V1  Device function is normal. Programming changes reprogramming of AV delays trying to improve repeat electrical resynchronization with a decrease of  the AV delay from 80--60 and the change in the RV LV offset See Paceart for details       CrCl cannot be calculated (Patient's most recent lab result is older than the maximum 21 days allowed.).   Assessment and  Plan  Nonischemic cardiomyopathy with biventricular involvement   Left bundle branch block fractionation   Congestive heart failure-systolic-class 2b  ICD-CRT-Saint Jude   MR mild-moderate   PVCs   Will recheck PVC burden today with an event recorder  Remains on GDMT.   Current medicines are reviewed at length with the patient today .  The patient does not have concerns regarding medicines.

## 2024-02-04 NOTE — Patient Instructions (Addendum)
 Medication Instructions:  Your physician recommends that you continue on your current medications as directed. Please refer to the Current Medication list given to you today.  *If you need a refill on your cardiac medications before your next appointment, please call your pharmacy*  Lab Work: None ordered.  If you have labs (blood work) drawn today and your tests are completely normal, you will receive your results only by: MyChart Message (if you have MyChart) OR A paper copy in the mail If you have any lab test that is abnormal or we need to change your treatment, we will call you to review the results.  Testing/Procedures: Your physician has recommended that you wear an event monitor. Event monitors are medical devices that record the heart's electrical activity. Doctors most often us  these monitors to diagnose arrhythmias. Arrhythmias are problems with the speed or rhythm of the heartbeat. The monitor is a small, portable device. You can wear one while you do your normal daily activities. This is usually used to diagnose what is causing palpitations/syncope (passing out).   Follow-Up: At [facility], you and your health needs are our priority.  As part of our continuing mission to provide you with exceptional heart care, our providers are all part of one team.  This team includes your primary Cardiologist (physician) and Advanced Practice Providers or APPs (Physician Assistants and Nurse Practitioners) who all work together to provide you with the care you need, when you need it.  Your next appointment:   6 months  [employer] Cardiac Event Monitor Instructions  Your physician has requested you wear your cardiac event monitor for _____ days, (1-30). [employer] may call or text to confirm a shipping address. The monitor will be sent to a land address via UPS. [employer] will not ship a monitor to a PO BOX. It typically takes 3-5 days to receive your monitor after it has been  enrolled. [employer] will assist with USPS tracking if your package is delayed. The telephone number for [employer] is [phone]. Once you have received your monitor, please review the enclosed instructions. Instruction tutorials can also be viewed under help and settings on the enclosed cell phone. Your monitor has already been registered assigning a specific monitor serial # to you.  Billing and Self Pay Discount Information  [employer] has been provided the insurance information we had on file for you.  If your insurance has been updated, please call [employer] at [phone] to provide them with your updated insurance information.   [employer] offers a discounted Self Pay option for patients who have insurance that does not cover their cardiac event monitor or patients without insurance.  The discounted cost of a Self Pay Cardiac Event Monitor would be $225.00 , if the patient contacts [employer] at [phone] within 7 days of applying the monitor to make payment arrangements.  If the patient does not contact [employer] within 7 days of applying the monitor, the cost of the cardiac event monitor will be $350.00.  Applying the monitor  Remove cell phone from case and turn it on. The cell phone works as IT consultant and needs to be within [employer] of you at all times. The cell phone will need to be charged on a daily basis. We recommend you plug the cell phone into the enclosed charger at your bedside table every night.  Monitor batteries: You will receive two monitor batteries labelled #1 and #2. These are your recorders. Plug battery #2 onto the second connection on the enclosed  Consulting civil engineer. Keep one battery on the charger at all times. This will keep the monitor battery deactivated. It will also keep it fully charged for when you need to switch your monitor batteries. A small light will be blinking on the battery emblem when it is charging. The light on the battery emblem will remain  on when the battery is fully charged.  Open package of a Monitor strip. Insert battery #1 into black hood on strip and gently squeeze monitor battery onto connection as indicated in instruction booklet. Set aside while preparing skin.  Choose location for your strip, vertical or horizontal, as indicated in the instruction booklet. Shave to remove all hair from location. There cannot be any lotions, oils, powders, or colognes on skin where monitor is to be applied. Wipe skin clean with enclosed Saline wipe. Dry skin completely.  Peel paper labeled #1 off the back of the Monitor strip exposing the adhesive. Place the monitor on the chest in the vertical or horizontal position shown in the instruction booklet. One arrow on the monitor strip must be pointing upward. Carefully remove paper labeled #2, attaching remainder of strip to your skin. Try not to create any folds or wrinkles in the strip as you apply it.  Firmly press and release the circle in the center of the monitor battery. You will hear a small beep. This is turning the monitor battery on. The heart emblem on the monitor battery will light up every 5 seconds if the monitor battery in turned on and connected to the patient securely. Do not push and hold the circle down as this turns the monitor battery off. The cell phone will locate the monitor battery. A screen will appear on the cell phone checking the connection of your monitor strip. This may read poor connection initially but change to good connection within the next minute. Once your monitor accepts the connection you will hear a series of 3 beeps followed by a climbing crescendo of beeps. A screen will appear on the cell phone showing the two monitor strip placement options. Touch the picture that demonstrates where you applied the monitor strip.  Your monitor strip and battery are waterproof. You are able to shower, bathe, or swim with the monitor on. They just ask you do not  submerge deeper than 3 feet underwater. We recommend removing the monitor if you are swimming in a lake, river, or ocean.  Your monitor battery will need to be switched to a fully charged monitor battery approximately once a week. The cell phone will alert you of an action which needs to be made.  On the cell phone, tap for details to reveal connection status, monitor battery status, and cell phone battery status. The green dots indicates your monitor is in good status. A red dot indicates there is something that needs your attention.  To record a symptom, click the circle on the monitor battery. In 30-60 seconds a list of symptoms will appear on the cell phone. Select your symptom and tap save. Your monitor will record a sustained or significant arrhythmia regardless of you clicking the button. Some patients do not feel the heart rhythm irregularities. [employer] will notify us  of any serious or critical events.  Refer to instruction booklet for instructions on switching batteries, changing strips, the Do not disturb or Pause features, or any additional questions.  Call [employer] at [phone], to confirm your monitor is transmitting and record your baseline. They will answer any questions you may have regarding  the monitor instructions at that time.  Returning the monitor to [employer]  Place all equipment back into blue box. Peel off strip of paper to expose adhesive and close box securely. There is a prepaid UPS shipping label on this box. Drop in a UPS drop box, or at a UPS facility like [employer]. You may also contact [employer] to arrange UPS to pick up monitor package at your home.       1st Floor: - Lobby - Registration  - Pharmacy  - Lab - Cafe  2nd Floor: - PV Lab - Diagnostic Testing (echo, CT, nuclear med)  3rd Floor: - Vacant  4th Floor: - TCTS (cardiothoracic surgery) - AFib Clinic - Structural Heart Clinic - Vascular Surgery  - Vascular  Ultrasound  5th Floor: - [facility] (general and EP) - Clinical Pharmacy for coumadin, hypertension, lipid, weight-loss medications, and med management appointments    Valet parking services will be available as well.

## 2024-02-09 LAB — CUP PACEART INCLINIC DEVICE CHECK
Battery Remaining Longevity: 67 mo
Brady Statistic RA Percent Paced: 0.81 %
Brady Statistic RV Percent Paced: 92 %
Date Time Interrogation Session: 20250423081000
HighPow Impedance: 63 Ohm
Implantable Lead Connection Status: 753985
Implantable Lead Connection Status: 753985
Implantable Lead Connection Status: 753985
Implantable Lead Implant Date: 20230626
Implantable Lead Implant Date: 20230626
Implantable Lead Implant Date: 20230626
Implantable Lead Location: 753858
Implantable Lead Location: 753859
Implantable Lead Location: 753860
Implantable Lead Model: 7122
Implantable Pulse Generator Implant Date: 20230626
Lead Channel Impedance Value: 487.5 Ohm
Lead Channel Impedance Value: 537.5 Ohm
Lead Channel Impedance Value: 600 Ohm
Lead Channel Pacing Threshold Amplitude: 0.5 V
Lead Channel Pacing Threshold Amplitude: 0.5 V
Lead Channel Pacing Threshold Amplitude: 1.5 V
Lead Channel Pacing Threshold Amplitude: 1.5 V
Lead Channel Pacing Threshold Amplitude: 2.75 V
Lead Channel Pacing Threshold Amplitude: 2.75 V
Lead Channel Pacing Threshold Pulse Width: 0.5 ms
Lead Channel Pacing Threshold Pulse Width: 0.5 ms
Lead Channel Pacing Threshold Pulse Width: 0.5 ms
Lead Channel Pacing Threshold Pulse Width: 0.5 ms
Lead Channel Pacing Threshold Pulse Width: 0.5 ms
Lead Channel Pacing Threshold Pulse Width: 0.5 ms
Lead Channel Sensing Intrinsic Amplitude: 11.6 mV
Lead Channel Sensing Intrinsic Amplitude: 2.6 mV
Lead Channel Setting Pacing Amplitude: 1.5 V
Lead Channel Setting Pacing Amplitude: 2 V
Lead Channel Setting Pacing Amplitude: 2 V
Lead Channel Setting Pacing Pulse Width: 0.5 ms
Lead Channel Setting Pacing Pulse Width: 0.5 ms
Lead Channel Setting Sensing Sensitivity: 0.5 mV
Pulse Gen Serial Number: 8949760

## 2024-02-20 NOTE — Progress Notes (Signed)
 Remote ICD transmission.

## 2024-03-15 ENCOUNTER — Ambulatory Visit: Attending: Internal Medicine

## 2024-03-15 DIAGNOSIS — R002 Palpitations: Secondary | ICD-10-CM

## 2024-03-22 DIAGNOSIS — R002 Palpitations: Secondary | ICD-10-CM

## 2024-03-31 DIAGNOSIS — H5213 Myopia, bilateral: Secondary | ICD-10-CM | POA: Diagnosis not present

## 2024-04-06 ENCOUNTER — Ambulatory Visit (INDEPENDENT_AMBULATORY_CARE_PROVIDER_SITE_OTHER): Payer: 59

## 2024-04-06 DIAGNOSIS — I447 Left bundle-branch block, unspecified: Secondary | ICD-10-CM

## 2024-04-06 LAB — CUP PACEART REMOTE DEVICE CHECK
Battery Remaining Longevity: 65 mo
Battery Remaining Percentage: 74 %
Battery Voltage: 2.99 V
Brady Statistic AP VP Percent: 4.6 %
Brady Statistic AP VS Percent: 1 %
Brady Statistic AS VP Percent: 81 %
Brady Statistic AS VS Percent: 3.3 %
Brady Statistic RA Percent Paced: 1 %
Date Time Interrogation Session: 20250624040018
HighPow Impedance: 59 Ohm
HighPow Impedance: 59 Ohm
Implantable Lead Connection Status: 753985
Implantable Lead Connection Status: 753985
Implantable Lead Connection Status: 753985
Implantable Lead Implant Date: 20230626
Implantable Lead Implant Date: 20230626
Implantable Lead Implant Date: 20230626
Implantable Lead Location: 753858
Implantable Lead Location: 753859
Implantable Lead Location: 753860
Implantable Lead Model: 7122
Implantable Pulse Generator Implant Date: 20230626
Lead Channel Impedance Value: 460 Ohm
Lead Channel Impedance Value: 530 Ohm
Lead Channel Impedance Value: 560 Ohm
Lead Channel Pacing Threshold Amplitude: 0.5 V
Lead Channel Pacing Threshold Amplitude: 1 V
Lead Channel Pacing Threshold Amplitude: 1.125 V
Lead Channel Pacing Threshold Pulse Width: 0.5 ms
Lead Channel Pacing Threshold Pulse Width: 0.5 ms
Lead Channel Pacing Threshold Pulse Width: 0.5 ms
Lead Channel Sensing Intrinsic Amplitude: 11.6 mV
Lead Channel Sensing Intrinsic Amplitude: 2.2 mV
Lead Channel Setting Pacing Amplitude: 2 V
Lead Channel Setting Pacing Amplitude: 2 V
Lead Channel Setting Pacing Amplitude: 2 V
Lead Channel Setting Pacing Pulse Width: 0.5 ms
Lead Channel Setting Pacing Pulse Width: 0.5 ms
Lead Channel Setting Sensing Sensitivity: 0.5 mV
Pulse Gen Serial Number: 8949760

## 2024-04-10 ENCOUNTER — Other Ambulatory Visit (HOSPITAL_COMMUNITY): Payer: Self-pay | Admitting: Internal Medicine

## 2024-04-14 ENCOUNTER — Other Ambulatory Visit (HOSPITAL_COMMUNITY): Payer: Self-pay

## 2024-04-26 ENCOUNTER — Encounter: Payer: Self-pay | Admitting: Physical Medicine and Rehabilitation

## 2024-04-26 ENCOUNTER — Other Ambulatory Visit: Payer: Self-pay | Admitting: Physical Medicine and Rehabilitation

## 2024-04-26 DIAGNOSIS — M5416 Radiculopathy, lumbar region: Secondary | ICD-10-CM

## 2024-04-28 ENCOUNTER — Other Ambulatory Visit: Payer: Commercial Managed Care - HMO

## 2024-04-28 DIAGNOSIS — Z8546 Personal history of malignant neoplasm of prostate: Secondary | ICD-10-CM | POA: Diagnosis not present

## 2024-04-29 ENCOUNTER — Ambulatory Visit: Payer: Self-pay

## 2024-04-29 LAB — PSA: Prostate Specific Ag, Serum: <0.1 ng/mL (ref 0.0–4.0)

## 2024-05-05 NOTE — Progress Notes (Unsigned)
 Name: Ruben Duffy DOB: 09/16/1966 MRN: [id number]  History of Present Illness: Ruben Duffy is a 58 y.o. male who presents today for follow up visit at [facility] [place].  Relevant History includes: 1. Prostate cancer (T2a Nx Mx GG2).  - 05/15/2021: Brachytherapy seeds implanted.  2. OAB with urinary frequency, nocturia, urgency, and urge incontinence. 3. Erectile dysfunction.  PSA values: - 11/03/2020:  - 01/10/2022:  - 03/28/2022: 0.3 - 10/03/2022: 0.4 - 03/27/2023: 0.2 - 11/06/2023: <0.1 - 04/28/2024: <0.1  At last visit with Ruben Duffy on 11/06/2023: 1. Scrotal pain after recurrent left inguinal hernia repair by General Surgery (Ruben Duffy) on 03/13/2023.  - I have recommended that he return to his surgeon.   2. History of prostate cancer. - PSA rechecked (<0.1). - Advised repeat PSA in 6 months. 3. OAB with urgency and UUI. - He drinks 4 cups of coffee in the morning and then will have the frequent. - He was unable to get coverage for Gemtesa  or Myrbetriq .  I will try him on vesicare  5mg  side effects reviewed. 4. Glucosuria.  - Due to Farxiga  use.  5. ED.  - He hasn't been using the sildenafil .  He has some pain post ejaculation.  Since last visit: > 12/22/2023: Seen by Ruben Duffy for chronic left inguinodynia. Per note: Discussed with the patient in detail that on exam there is no evidence of hernia recurrence.  Potential sources may be related to ilioinguinal nerve issues as well as potential back issues.  He is currently in the process of being evaluated by neurosurgery for his degenerative disease on his spine. Ruben Duffy performed a left ilioinguinal nerve block, which patient reports helped with his pain for awhile.  > 04/28/2024: PSA <0.1  Today: He reports mild symptomatic improvement since starting Vesicare  (Solifenacin ) 5 mg daily. He reports mild dry mouth, which he states is not significantly bothersome. Denies constipation or difficulty  voiding. He reports some ongoing urinary urgency, frequency, nocturia x3+, and solid intermittent urinary stream with some terminal dribbling. Denies dysuria, gross hematuria, hesitancy, straining to void, or sensations of incomplete emptying. He reports significant caffeine intake (5-7 caffeinated beverages per day on average).  Medications: Current Outpatient Medications  Medication Sig Dispense Refill   carvedilol  (COREG ) 6.25 MG tablet Take 1 tablet (6.25 mg total) by mouth 2 (two) times daily with a meal. 180 tablet 3   FARXIGA  10 MG TABS tablet Take 1 tablet (10 mg total) by mouth daily. 30 tablet 11   folic acid (FOLVITE) 1 MG tablet Take 1 mg by mouth daily.     Multiple Vitamin (MULTIVITAMIN ADULT PO) Take by mouth daily.     rosuvastatin  (CRESTOR ) 10 MG tablet Take 1 tablet by mouth once daily 90 tablet 0   sacubitril -valsartan  (ENTRESTO ) 24-26 MG Take 1 tablet by mouth 2 (two) times daily. 180 tablet 3   solifenacin  (VESICARE ) 5 MG tablet Take 1 tablet (5 mg total) by mouth daily. 30 tablet 11   spironolactone  (ALDACTONE ) 25 MG tablet Take 1 tablet (25 mg total) by mouth daily. 90 tablet 3   valACYclovir  (VALTREX ) 500 MG tablet Take 1 tablet (500 mg total) by mouth 2 (two) times daily as needed. 20 tablet 2   CINNAMON PO Take 2 tablets by mouth daily.     methotrexate (RHEUMATREX) 2.5 MG tablet Take 15 mg by mouth once a week. On Thursdays     omega-3 acid ethyl esters (LOVAZA) 1 g capsule Take 1 g  by mouth 2 (two) times daily.     TURMERIC CURCUMIN PO Take 2 capsules by mouth daily.     No current facility-administered medications for this visit.    Allergies: No Known Allergies  Past Medical History:  Diagnosis Date   AICD (automatic cardioverter/defibrillator) present 04/08/2022   a.) St. Jude Quadra Assura CRT-D   Arthritis    Bilateral inguinal hernia    a.) s/p BILATERAL repair 11/19/2022; b.) recurrent hernia on LEFT   CAD (coronary artery disease) 09/10/2021   a.)  R/LHC 09/10/2021: 30% pLCx - med mgmt.   Cardiomegaly    CHF (congestive heart failure) (HCC)    a.) TTE 09/07/2021: EF 25-30%, diff inf HK, mod LV dil, mod LAE, mod-sev MR; b.) R/LHC 09/10/2021: EF 25%, mPA 13, PCWP 5, CO 6.2, CI 3.1; c.) cMRI 09/11/2021: EF 14%, sev LV dil, diff HK; d.) TTE 01/11/2022: EF 25-30%, glob HK, mod LV dil, mod LAE, mild MR, G2DD; e.) CRT-D implanted 04/08/2022; f.) TTE 08/08/2022: EF 30-35%, glob HK, triv MR, G1DD   DDD (degenerative disc disease), lumbar    Emphysema of lung (HCC)    Erectile dysfunction    a.) on PDE5i (sildenafil ) PRN   GERD (gastroesophageal reflux disease)    Hemorrhoids    Hepatitis C virus infection cured after antiviral drug therapy    History of substance abuse (HCC)    a.) THC + crack cocaine   HLD (hyperlipidemia)    Hypertension    Hypospadias, balanic    LBBB (left bundle branch block) 04/26/2021   a.) noted on preop ECG 04/26/2021   Long term current use of amiodarone     Lower urinary tract symptoms (LUTS)    NICM (nonischemic cardiomyopathy) (HCC)    a.) TTE 09/07/2021: EF 25-30%; b.) R/LHC 09/10/2021: mPA 13, PCWP 5, CO 6.2, CI 3.1; c.) cMRI 09/11/2021: EF 14%, diff HK, RV insertion site LGE; d.) TTE 01/11/2022: EF 25-30% e.) CRT-D implanted 04/08/2022; f.) TTE 08/08/2022: EF 30-35%   Pre-diabetes    Prostate cancer (HCC) 01/08/2021   a.) Gleason 3+4, T2a; b.) s/p brachytherapy 06/05/2021   Umbilical hernia    a.) s/p repair 11/19/2022   Past Surgical History:  Procedure Laterality Date   BIOPSY PROSTATE     BIV ICD INSERTION CRT-D N/A 04/08/2022   Procedure: BIV ICD INSERTION CRT-D;  Surgeon: Ruben Elspeth BROCKS, MD;  Location: [facility] INVASIVE CV LAB;  Service: Cardiovascular;  Laterality: N/A;   CIRCUMCISION N/A 06/05/2021   Procedure: CIRCUMCISION ADULT;  Surgeon: Duffy Rush, MD;  Location: [facility];  Service: Urology;  Laterality: N/A;   COLONOSCOPY W/ POLYPECTOMY     HEMORRHOID SURGERY     HERNIA  REPAIR     Had 3 fixed, had re-repair the left groin hernia.   HYPOSPADIAS CORRECTION     INSERTION OF MESH  11/19/2022   Procedure: INSERTION OF MESH;  Surgeon: Duffy Laneta FALCON, MD;  Location: [facility];  Service: General;;   RADIOACTIVE SEED IMPLANT N/A 06/05/2021   Procedure: RADIOACTIVE SEED IMPLANT/BRACHYTHERAPY IMPLANT;  Surgeon: Duffy Rush, MD;  Location: [facility];  Service: Urology;  Laterality: N/A;   RIGHT/LEFT HEART CATH AND CORONARY ANGIOGRAPHY N/A 09/10/2021   Procedure: RIGHT/LEFT HEART CATH AND CORONARY ANGIOGRAPHY;  Surgeon: Cherrie Toribio SAUNDERS, MD;  Location: [facility] INVASIVE CV LAB;  Service: Cardiovascular;  Laterality: N/A;   SPACE OAR INSTILLATION N/A 06/05/2021   Procedure: SPACE OAR INSTILLATION;  Surgeon: Duffy Rush, MD;  Location: [facility];  Service: Urology;  Laterality: N/A;   UMBILICAL HERNIA REPAIR N/A 11/19/2022   Procedure: HERNIA REPAIR UMBILICAL ADULT;  Surgeon: Duffy Laneta FALCON, MD;  Location: [facility];  Service: General;  Laterality: N/A;   Family History  Problem Relation Age of Onset   COPD Mother    Stroke Father    Lung cancer Maternal Aunt    Diabetes Maternal Uncle    Brain cancer Maternal Uncle    Lung cancer Maternal Uncle    Lung cancer Maternal Uncle    Cancer Paternal Grandmother    Diabetes Paternal Grandfather    Colon cancer Neg Hx    Esophageal cancer Neg Hx    Stomach cancer Neg Hx    Pancreatic cancer Neg Hx    Colon polyps Neg Hx    Rectal cancer Neg Hx    Social History   Socioeconomic History   Marital status: Single    Spouse name: Not on file   Number of children: 1   Years of education: Not on file   Highest education level: GED or equivalent  Occupational History   Occupation: [employer]    Comment: Estate agent  Tobacco Use   Smoking status: Former    Current packs/day: 0.00    Average packs/day: 1.8 packs/day for 30.0 years (52.5 ttl pk-yrs)    Types: Cigarettes     Start date: 68    Quit date: 09/13/2008    Years since quitting: 15.6    Passive exposure: Past   Smokeless tobacco: Never  Vaping Use   Vaping status: Never Used  Substance and Sexual Activity   Alcohol use: Not Currently   Drug use: Not Currently    Types: Crack cocaine, Marijuana   Sexual activity: Not Currently  Other Topics Concern   Not on file  Social History Narrative   Lives with mother   Social Drivers of Health   Financial Resource Strain: Medium Risk (11/20/2023)   Overall Financial Resource Strain (CARDIA)    Difficulty of Paying Living Expenses: Somewhat hard  Food Insecurity: No Food Insecurity (11/20/2023)   Hunger Vital Sign    Worried About Running Out of Food in the Last Year: Never true    Ran Out of Food in the Last Year: Never true  Transportation Needs: No Transportation Needs (11/20/2023)   PRAPARE - Administrator, Civil Service (Medical): No    Lack of Transportation (Non-Medical): No  Physical Activity: Unknown (11/20/2023)   Exercise Vital Sign    Days of Exercise per Week: Patient declined    Minutes of Exercise per Session: Not on file  Stress: No Stress Concern Present (11/20/2023)   [employer] of Occupational Health - Occupational Stress Questionnaire    Feeling of Stress : Not at all  Social Connections: Socially Isolated (11/20/2023)   Social Connection and Isolation Panel    Frequency of Communication with Friends and Family: Never    Frequency of Social Gatherings with Friends and Family: Once a week    Attends Religious Services: Never    Database administrator or Organizations: No    Attends Engineer, structural: Not on file    Marital Status: Divorced  Intimate Partner Violence: Not At Risk (07/27/2021)   Humiliation, Afraid, Rape, and Kick questionnaire    Fear of Current or Ex-Partner: No    Emotionally Abused: No    Physically Abused: No    Sexually Abused: No  Review of Systems Constitutional: Patient  denies any unintentional weight loss or change in strength lntegumentary: Patient denies any rashes or pruritus Cardiovascular: Patient denies chest pain or syncope Respiratory: Patient denies shortness of breath Gastrointestinal: Patient denies nausea, vomiting, constipation, or diarrhea  Musculoskeletal: Patient denies muscle cramps or weakness Neurologic: Patient denies convulsions or seizures Allergic/Immunologic: Patient denies recent allergic reaction(s) Hematologic/Lymphatic: Patient denies bleeding tendencies Endocrine: Patient denies heat/cold intolerance  GU: As per HPI.  OBJECTIVE Vitals:   05/06/24 1028  BP: 115/74  Pulse: 72   There is no height or weight on file to calculate BMI.  Physical Examination Constitutional: No obvious distress; patient is non-toxic appearing  Cardiovascular: No visible lower extremity edema.  Respiratory: The patient does not have audible wheezing/stridor; respirations do not appear labored  Gastrointestinal: Abdomen non-distended Musculoskeletal: Normal ROM of UEs  Skin: No obvious rashes/open sores  Neurologic: CN 2-12 grossly intact Psychiatric: Answered questions appropriately with normal affect  Hematologic/Lymphatic/Immunologic: No obvious bruises or sites of spontaneous bleeding  UA: glucosuria (secondary to Farxiga  use), otherwise unremarkable PVR: 0 ml  ASSESSMENT OAB (overactive bladder) - Plan: BLADDER SCAN AMB NON-IMAGING, Urinalysis, Routine w reflex microscopic  Malignant neoplasm of prostate (HCC) - Plan: PSA, total and free  Caffeine use  1. Prostate cancer. We reviewed PSA results, which have been trending downwards as expected following brachytherapy. Will recheck in 6 months.   2. OAB with urinary frequency, nocturia, urgency, and urge incontinence.  We discussed the symptoms of overactive bladder (OAB), which include urinary urgency, frequency, nocturia, with or without urge incontinence.  While we may not know  the exact etiology of OAB, several risk factors can be identified.  - Patient's neurogenic risk factors: T2DM, prior nicotine use, spinal stenosis. - Patient's exacerbating factors include: significant caffeine intake, glucosuria (due to Farxiga  use).  We discussed the following management options in detail including potential benefits, risks, and side effects: Behavioral therapy: Minimize / avoid caffeine intake Medication(s): OK to continue Vesicare   Will plan for follow up in 6 months or sooner if needed. Pt verbalized understanding and agreement. All questions were answered.  PLAN Advised the following: 1. Minimize / avoid caffeine intake. 2. Continue Vesicare  (Solifenacin ) 5 mg daily. 3. Return in about 6 months (around 11/06/2024) for OAB & history of prostate cancer / UA & PVR / lab visit for PSA prior.  Orders Placed This Encounter  Procedures   Urinalysis, Routine w reflex microscopic   PSA, total and free    Standing Status:   Future    Expected Date:   11/06/2024    Expiration Date:   05/06/2025   BLADDER SCAN AMB NON-IMAGING    It has been explained that the patient is to follow regularly with their PCP in addition to all other providers involved in their care and to follow instructions provided by these respective offices. Patient advised to contact urology clinic if any urologic-pertaining questions, concerns, new symptoms or problems arise in the interim period.  Patient Instructions  Overactive bladder (OAB) overview for patients:  Symptoms may include: urinary urgency (gotta go feeling) urinary frequency (voiding >8 times per day) night time urination (nocturia) urge incontinence of urine (UUI)  While we do not know the exact etiology of OAB, several treatment options exist including:  Behavioral therapy: Reducing fluid intake Decreasing bladder stimulants (such as caffeine) and irritants (such as acidic food, spicy foods, alcohol) Urge suppression  strategies Bladder retraining via timed voiding  Pelvic floor physical therapy  Medication(s) - can use one or both of the drug classes below. Anticholinergic / antimuscarinic medications:  Mechanism of action: Activate M3 receptors to reduce detrusor stimulation and increase bladder capacity   (parasympathetic nervous system). Effect: Relaxes the bladder to decrease overactivity, increase bladder storage capacity, and increase time between voids. Onset: Slow acting (may take 8-12 weeks to determine efficacy). Medications include: Vesicare  (Solifenacin ), Ditropan (Oxybutynin), Detrol (Tolterodine), Toviaz (Fesoterodine), Sanctura (Trospium), Urispas (Flavoxate), Enablex (Darifenacin), Bentyl (Dicyclomine), Levsin (Hyoscyamine ). Potential side effects include but are not limited to: Dry eyes, dry mouth, constipation, cognitive impairment, dementia risk with long term use, and urinary retention/ incomplete bladder emptying. Insurance companies generally prefer for patients to try 1-2 anticholinergic / antimuscarinic medications first due to low cost. Some exceptions are made based on patient-specific comorbidities / risk factors. Beta-3 agonist medications: Mechanism of action: Stimulates selective B3 adrenergic receptors to cause smooth muscle bladder relaxation (sympathetic nervous system). Effect: Relaxes the bladder to decrease overactivity, increase bladder storage capacity, and increase time between voids. Onset: Slow acting (may take 8-12 weeks to determine efficacy). Medications include: Myrbetriq  (Mirabegron ) and Vibegron  (Gemtesa ). Potential side effects include but are not limited to: urinary retention / incomplete bladder emptying and elevated blood pressure (more likely to occur in individuals with pre-existing uncontrolled hypertension). These medications tend to be more expensive than the anticholinergic / antimuscarinic medications.   For patients with refractory OAB (if the  above treatment options have been unsuccessful): Posterior tibial nerve stimulation (PTNS). Small acupuncture-type needle inserted near ankle with electric current to stimulate bladder via posterior tibial nerve pathway. Initially requires 12 weekly in-office treatments lasting 30 minutes each; followed by monthly in-office treatments lasting 30 minutes each for 1 year.  Bladder Botox injections. How it is done: Typically done via in-office cystoscopy; sometimes done in the OR depending on the situation. The bladder is numbed with lidocaine  instilled via a catheter. Then the urologist injects Botox into the bladder muscle wall in about 20 locations. Causes local paralysis of the bladder muscle at the injection sites to reduce bladder muscle overactivity / spasms. The effect lasts for approximately 6 months and cannot be reversed once performed. Risks may included but are not limited to: infection, incomplete bladder emptying/ urinary retention, short term need for self-catheterization or indwelling catheter, and need for repeat therapy. There is a 5-12% chance of needing to catheterize with Botox - that usually resolves in a few months as the Botox wears off. Typically Botox injections would need to be repeated every 3-12 months since this is not a permanent therapy.  Sacral neuromodulation trial (Medtronic lnterStim or Axonics implant). Sacral neuromodulation is FDA-approved for uncontrolled urinary urgency, urinary frequency, urinary urge incontinence, non-obstructive urinary retention, or fecal incontinence. It is not FDA-approved as a treatment for pain. The goal of this therapy is at least a 50% improvement in symptoms. It is NOT realistic to expect a 100% cure. This is a a 2-step outpatient procedure. After a successful test period, a permanent wire and generator are placed in the OR. We discussed the risk of infection. We reviewed the fact that about 30% of patients fail the test phase and are not  candidates for permanent generator placement. During the 1-2 week trial phase, symptoms are documented by the patient to determine response. If patient gets at least a 50% improvement in symptoms, they may then proceed with Step 2. Step 1: Trial lead placement. Per physician discretion, may done one of two ways: Percutaneous nerve evaluation (PNE)  in the [facility] urology office. Performed by urologist under local anesthesia (numbing the area with lidocaine ) using a spinal needle for placement of test wire, which usually stays in place for 5-7 days to determine therapy response. Test lead placement in OR under anesthesia. Usually stays in place 2 weeks to determine therapy response. > Step 2: Permanent implantation of sacral neuromodulation device, which is performed in the OR.  Sacral neuromodulation implants: All are conditionally MRI safe. Manufacturer: Medtronic Website: [employer]gl therapy/right-for-you.html Options: lnterStim X: Non-rechargeable. The battery lasts 10 years on average. lnterStim Micro: Rechargeable. The battery lasts 15 years on average and must be charged routinely. Approximately 50% smaller implant than lnterStim X implant.  Manufacturer: Axonics Website: Findrealrelief.axonics.com Options: Non-rechargeable (Axonics F15): The battery lasts 15 years on average. Rechargeable (Axonics R20): The battery lasts 20 years on average and must be charged in office for about 1 hour every 6-10 months on average. Approximately 50% smaller implant than Axonics non-rechargeable implant.  Note: Generally the rechargeable devices are only advised for very small or thin patients who may not have sufficient adipose tissue to comfortably overlay the implanted device.  Suprapubic catheter (SP tube) placement. Only done in severely refractory OAB when all other options have failed or are not a viable treatment  choice depending on patient factors. Involves placement of a catheter through the lower abdomen into the bladder to continuously drain the bladder into an external collection bag, which patient can then empty at their convenience every few hours. Done via an outpatient surgical procedure in the OR under anesthesia. Risks may included but are not limited to: surgical site pain, infections, skin irritation / breakdown, chronic bacteriuria, symptomatic UTls. The SP tube must stay in place continuously. This is a reversible procedure however - the insertion site will close if catheter is removed for more than a few hours. The SP tube must be exchanged routinely every 4 weeks to prevent the catheter from becoming clogged with sediment. SP tube exchanges are typically performed at a urology nurse visit or by a home health nurse.  Total time spent caring for the patient today was over 30 minutes. This includes time spent on the date of the visit reviewing the patient's chart before the visit, time spent during the visit, and time spent after the visit on documentation. Over 50% of that time was spent in face-to-face time with this patient for direct counseling. E&M based on time and complexity of medical decision making.  Electronically signed by:  Lauraine JAYSON Oz, FNP   05/06/24    11:00 AM

## 2024-05-06 ENCOUNTER — Encounter: Payer: Self-pay | Admitting: Urology

## 2024-05-06 ENCOUNTER — Ambulatory Visit (INDEPENDENT_AMBULATORY_CARE_PROVIDER_SITE_OTHER): Payer: Commercial Managed Care - HMO | Admitting: Urology

## 2024-05-06 VITALS — BP 115/74 | HR 72

## 2024-05-06 DIAGNOSIS — C61 Malignant neoplasm of prostate: Secondary | ICD-10-CM | POA: Diagnosis not present

## 2024-05-06 DIAGNOSIS — F1590 Other stimulant use, unspecified, uncomplicated: Secondary | ICD-10-CM | POA: Diagnosis not present

## 2024-05-06 DIAGNOSIS — N3941 Urge incontinence: Secondary | ICD-10-CM | POA: Diagnosis not present

## 2024-05-06 DIAGNOSIS — N3281 Overactive bladder: Secondary | ICD-10-CM | POA: Diagnosis not present

## 2024-05-06 DIAGNOSIS — Z789 Other specified health status: Secondary | ICD-10-CM | POA: Insufficient documentation

## 2024-05-06 LAB — URINALYSIS, ROUTINE W REFLEX MICROSCOPIC
Bilirubin, UA: NEGATIVE
Ketones, UA: NEGATIVE
Leukocytes,UA: NEGATIVE
Nitrite, UA: NEGATIVE
Protein,UA: NEGATIVE
RBC, UA: NEGATIVE
Specific Gravity, UA: <1.005 — ABNORMAL LOW (ref 1.005–1.030)
Urobilinogen, Ur: 0.2 mg/dL (ref 0.2–1.0)
pH, UA: 7 (ref 5.0–7.5)

## 2024-05-06 NOTE — Patient Instructions (Signed)

## 2024-05-20 ENCOUNTER — Ambulatory Visit: Admitting: Physical Medicine and Rehabilitation

## 2024-05-20 ENCOUNTER — Other Ambulatory Visit: Payer: Self-pay

## 2024-05-20 VITALS — BP 122/75 | HR 66

## 2024-05-20 DIAGNOSIS — M5416 Radiculopathy, lumbar region: Secondary | ICD-10-CM | POA: Diagnosis not present

## 2024-05-20 MED ORDER — METHYLPREDNISOLONE ACETATE 40 MG/ML IJ SUSP
40.0000 mg | Freq: Once | INTRAMUSCULAR | Status: AC
  Administered 2024-05-20: 40 mg

## 2024-05-20 NOTE — Patient Instructions (Signed)

## 2024-05-20 NOTE — Procedures (Signed)
 Lumbosacral Transforaminal Epidural Steroid Injection - Sub-Pedicular Approach with Fluoroscopic Guidance  Patient: Ruben Duffy      Date of Birth: Jun 21, 1966 MRN: [id number] PCP: Melvenia Manus BRAVO, MD      Visit Date: 05/20/2024   Universal Protocol:    Date/Time: 05/20/2024  Consent Given By: the patient  Position: PRONE  Additional Comments: Vital signs were monitored before and after the procedure. Patient was prepped and draped in the usual sterile fashion. The correct patient, procedure, and site was verified.   Injection Procedure Details:   Procedure diagnoses: Lumbar radiculopathy [M54.16]    Meds Administered:  Meds ordered this encounter  Medications   methylPREDNISolone  acetate (DEPO-MEDROL ) injection 40 mg    Laterality: Left  Location/Site: L5  Needle:5.0 in., 22 ga.  Short bevel or Quincke spinal needle  Needle Placement: Transforaminal  Findings:    -Comments: Excellent flow of contrast along the nerve, nerve root and into the epidural space.  Procedure Details: After squaring off the end-plates to get a true AP view, the C-arm was positioned so that an oblique view of the foramen as noted above was visualized. The target area is just inferior to the nose of the scotty dog or sub pedicular. The soft tissues overlying this structure were infiltrated with 2-3 ml. of 1% Lidocaine  without Epinephrine .  The spinal needle was inserted toward the target using a trajectory view along the fluoroscope beam.  Under AP and lateral visualization, the needle was advanced so it did not puncture dura and was located close the 6 O'Clock position of the pedical in AP tracterory. Biplanar projections were used to confirm position. Aspiration was confirmed to be negative for CSF and/or blood. A 1-2 ml. volume of Isovue-250 was injected and flow of contrast was noted at each level. Radiographs were obtained for documentation purposes.   After attaining the desired flow  of contrast documented above, a 0.5 to 1.0 ml test dose of 0.25% Marcaine  was injected into each respective transforaminal space.  The patient was observed for 90 seconds post injection.  After no sensory deficits were reported, and normal lower extremity motor function was noted,   the above injectate was administered so that equal amounts of the injectate were placed at each foramen (level) into the transforaminal epidural space.   Additional Comments:  The patient tolerated the procedure well Dressing: 2 x 2 sterile gauze and Band-Aid    Post-procedure details: Patient was observed during the procedure. Post-procedure instructions were reviewed.  Patient left the clinic in stable condition.

## 2024-05-20 NOTE — Progress Notes (Signed)
 Pain Scale   Average Pain 6 Patient advising he has lower back pain radiating to left hip area and pain increases when walking and standing for long periods of time. Patient advising his pain decreases when he rests.        +Driver, -BT, -Dye Allergies.

## 2024-05-20 NOTE — Progress Notes (Signed)
 Ruben Duffy - 58 y.o. male MRN [id number]  Date of birth: 1966/01/10  Office Visit Note: Visit Date: 05/20/2024 PCP: Melvenia Manus BRAVO, MD Referred by: Trudy Duwaine BRAVO, NP  Subjective: Chief Complaint  Patient presents with   Lower Back - Pain   HPI:  Ruben Duffy is a 58 y.o. male who comes in today at the request of Duwaine Trudy, FNP for planned Left L5-S1 Lumbar Transforaminal epidural steroid injection with fluoroscopic guidance.  The patient has failed conservative care including home exercise, medications, time and activity modification.  This injection will be diagnostic and hopefully therapeutic.  Please see requesting physician notes for further details and justification.    ROS Otherwise per HPI.  Assessment & Plan: Visit Diagnoses:    ICD-10-CM   1. Lumbar radiculopathy  M54.16 XR C-ARM NO REPORT    Epidural Steroid injection    methylPREDNISolone  acetate (DEPO-MEDROL ) injection 40 mg      Plan: No additional findings.   Meds & Orders:  Meds ordered this encounter  Medications   methylPREDNISolone  acetate (DEPO-MEDROL ) injection 40 mg    Orders Placed This Encounter  Procedures   XR C-ARM NO REPORT   Epidural Steroid injection    Follow-up: Return for visit to requesting provider as needed.   Procedures: No procedures performed  Lumbosacral Transforaminal Epidural Steroid Injection - Sub-Pedicular Approach with Fluoroscopic Guidance  Patient: Ruben Duffy      Date of Birth: 1966-08-01 MRN: [id number] PCP: Melvenia Manus BRAVO, MD      Visit Date: 05/20/2024   Universal Protocol:    Date/Time: 05/20/2024  Consent Given By: the patient  Position: PRONE  Additional Comments: Vital signs were monitored before and after the procedure. Patient was prepped and draped in the usual sterile fashion. The correct patient, procedure, and site was verified.   Injection Procedure Details:   Procedure diagnoses: Lumbar radiculopathy [M54.16]    Meds  Administered:  Meds ordered this encounter  Medications   methylPREDNISolone  acetate (DEPO-MEDROL ) injection 40 mg    Laterality: Left  Location/Site: L5  Needle:5.0 in., 22 ga.  Short bevel or Quincke spinal needle  Needle Placement: Transforaminal  Findings:    -Comments: Excellent flow of contrast along the nerve, nerve root and into the epidural space.  Procedure Details: After squaring off the end-plates to get a true AP view, the C-arm was positioned so that an oblique view of the foramen as noted above was visualized. The target area is just inferior to the nose of the scotty dog or sub pedicular. The soft tissues overlying this structure were infiltrated with 2-3 ml. of 1% Lidocaine  without Epinephrine .  The spinal needle was inserted toward the target using a trajectory view along the fluoroscope beam.  Under AP and lateral visualization, the needle was advanced so it did not puncture dura and was located close the 6 O'Clock position of the pedical in AP tracterory. Biplanar projections were used to confirm position. Aspiration was confirmed to be negative for CSF and/or blood. A 1-2 ml. volume of Isovue-250 was injected and flow of contrast was noted at each level. Radiographs were obtained for documentation purposes.   After attaining the desired flow of contrast documented above, a 0.5 to 1.0 ml test dose of 0.25% Marcaine  was injected into each respective transforaminal space.  The patient was observed for 90 seconds post injection.  After no sensory deficits were reported, and normal lower extremity motor function was noted,   the above  injectate was administered so that equal amounts of the injectate were placed at each foramen (level) into the transforaminal epidural space.   Additional Comments:  The patient tolerated the procedure well Dressing: 2 x 2 sterile gauze and Band-Aid    Post-procedure details: Patient was observed during the procedure. Post-procedure  instructions were reviewed.  Patient left the clinic in stable condition.    Clinical History: MRI LUMBAR SPINE WITHOUT CONTRAST   TECHNIQUE: Multiplanar, multisequence MR imaging of the lumbar spine was performed. No intravenous contrast was administered.   COMPARISON:  None Available.   FINDINGS: Segmentation:  Standard.   Alignment: Trace retrolisthesis at L1-L2. Trace anterolisthesis at L5-S1.   Vertebrae: Chronic anterior wedging of L1. Degenerative endplate irregularity at T12-L1: L1-L2, L3-L4, and L4-L5. Mild degenerative endplate marrow edema. Vertebral body hemangioma at L4.   Conus medullaris and cauda equina: Conus extends to the L1 level. Conus and cauda equina appear normal.   Paraspinal and other soft tissues: Unremarkable.   Disc levels:   T12-L1: Minimal disc bulge.  No canal or foraminal stenosis.   L1-L2: Disc bulge with superimposed left foraminal protrusion and endplate osteophytic ridging. Minor facet arthropathy. No canal stenosis. No right foraminal stenosis. Mild to moderate left foraminal stenosis.   L2-L3: Disc bulge slightly eccentric to the right. Moderate facet arthropathy. No canal stenosis. Minor effacement of the right subarticular recess. No foraminal stenosis.   L3-L4: Disc bulge with superimposed right foraminal/far lateral protrusion and endplate osteophytic ridging. Moderate facet arthropathy with ligamentum flavum infolding. Mild canal stenosis. Partial effacement of the right subarticular recess. Moderate right and minor left foraminal stenosis.   L4-L5:  Minor facet arthropathy.  No canal or foraminal stenosis.   L5-S1: Anterolisthesis with uncovering of disc bulge with superimposed left foraminal protrusion. Moderate to marked facet arthropathy with ligamentum flavum infolding. No canal stenosis. Minor right and marked left foraminal stenosis.   IMPRESSION: Multilevel degenerative changes as detailed above. Facet  arthropathy is greatest at L2-L3, L3-L4, and L5-S1. No high-grade canal stenosis. Foraminal narrowing is greatest on the right at L3-L4 and on the left at L5-S1. Right subarticular recess narrowing is present at L3-L4.     Electronically Signed   By: Santina Blanch M.D.   On: 03/08/2022 12:31     Objective:  VS:  HT:    WT:   BMI:     BP:122/75  HR:66bpm  TEMP: ( )  RESP:  Physical Exam Vitals and nursing note reviewed.  Constitutional:      General: He is not in acute distress.    Appearance: Normal appearance. He is not ill-appearing.  HENT:     Head: Normocephalic and atraumatic.     Right Ear: External ear normal.     Left Ear: External ear normal.     Nose: No congestion.  Eyes:     Extraocular Movements: Extraocular movements intact.  Cardiovascular:     Rate and Rhythm: Normal rate.     Pulses: Normal pulses.  Pulmonary:     Effort: Pulmonary effort is normal. No respiratory distress.  Abdominal:     General: There is no distension.     Palpations: Abdomen is soft.  Musculoskeletal:        General: No tenderness or signs of injury.     Cervical back: Neck supple.     Right lower leg: No edema.     Left lower leg: No edema.     Comments: Patient has good distal strength  without clonus.  Skin:    Findings: No erythema or rash.  Neurological:     General: No focal deficit present.     Mental Status: He is alert and oriented to person, place, and time.     Sensory: No sensory deficit.     Motor: No weakness or abnormal muscle tone.     Coordination: Coordination normal.  Psychiatric:        Mood and Affect: Mood normal.        Behavior: Behavior normal.      Imaging: XR C-ARM NO REPORT Result Date: 05/20/2024 Please see Notes tab for imaging impression.

## 2024-05-27 ENCOUNTER — Other Ambulatory Visit (HOSPITAL_COMMUNITY): Payer: Self-pay | Admitting: Internal Medicine

## 2024-05-28 ENCOUNTER — Encounter: Payer: Self-pay | Admitting: Internal Medicine

## 2024-05-28 ENCOUNTER — Other Ambulatory Visit: Payer: Self-pay

## 2024-05-28 ENCOUNTER — Other Ambulatory Visit: Payer: Self-pay | Admitting: Internal Medicine

## 2024-05-28 DIAGNOSIS — I5022 Chronic systolic (congestive) heart failure: Secondary | ICD-10-CM

## 2024-05-28 DIAGNOSIS — I2510 Atherosclerotic heart disease of native coronary artery without angina pectoris: Secondary | ICD-10-CM

## 2024-05-28 DIAGNOSIS — I10 Essential (primary) hypertension: Secondary | ICD-10-CM

## 2024-05-28 DIAGNOSIS — R7303 Prediabetes: Secondary | ICD-10-CM

## 2024-05-28 MED ORDER — FOLIC ACID 1 MG PO TABS: 1.0000 mg | ORAL_TABLET | Freq: Every day | ORAL | 1 refills | Status: AC

## 2024-05-28 MED ORDER — ROSUVASTATIN CALCIUM 10 MG PO TABS: 10.0000 mg | ORAL_TABLET | Freq: Every day | ORAL | 1 refills | Status: DC

## 2024-05-28 MED ORDER — SACUBITRIL-VALSARTAN 24-26 MG PO TABS: 1.0000 | ORAL_TABLET | Freq: Two times a day (BID) | ORAL | 1 refills | Status: AC

## 2024-05-28 MED ORDER — FARXIGA 10 MG PO TABS: 10.0000 mg | ORAL_TABLET | Freq: Every day | ORAL | 1 refills | Status: DC

## 2024-05-28 MED ORDER — FARXIGA 10 MG PO TABS: 10.0000 mg | ORAL_TABLET | Freq: Every day | ORAL | 1 refills | Status: AC

## 2024-05-28 MED ORDER — SACUBITRIL-VALSARTAN 24-26 MG PO TABS: 1.0000 | ORAL_TABLET | Freq: Two times a day (BID) | ORAL | 1 refills | Status: DC

## 2024-05-28 MED ORDER — FOLIC ACID 1 MG PO TABS: 1.0000 mg | ORAL_TABLET | Freq: Every day | ORAL | 1 refills | Status: DC

## 2024-05-28 MED ORDER — CARVEDILOL 6.25 MG PO TABS: 6.2500 mg | ORAL_TABLET | Freq: Two times a day (BID) | ORAL | 1 refills | Status: DC

## 2024-05-28 MED ORDER — ROSUVASTATIN CALCIUM 10 MG PO TABS: 10.0000 mg | ORAL_TABLET | Freq: Every day | ORAL | 1 refills | Status: AC

## 2024-05-28 MED ORDER — CARVEDILOL 6.25 MG PO TABS: 6.2500 mg | ORAL_TABLET | Freq: Two times a day (BID) | ORAL | 1 refills | Status: AC

## 2024-05-28 NOTE — Telephone Encounter (Signed)
 Copied from CRM [id number]. Topic: Clinical - Medication Refill >> May 28, 2024  9:39 AM Nathanel BROCKS wrote:   Medication:  sacubitril -valsartan  (ENTRESTO ) 24-26 MG rosuvastatin  (CRESTOR ) 10 MG tablet FARXIGA  10 MG TABS tablet carvedilol  (COREG ) 6.25 MG tablet folic acid  (FOLVITE ) 1 MG tablet  Has the patient contacted their pharmacy? Yes   This is the patient's preferred pharmacy:  [facility] [address] [place] - [address] JEANETT HAMMERSMITH [address] [place] [address] Phone: [phone] Fax: [phone]  Is this the correct pharmacy for this prescription? Yes If no, delete pharmacy and type the correct one.   Has the prescription been filled recently? Yes  Is the patient out of the medication? Yes  Has the patient been seen for an appointment in the last year OR does the patient have an upcoming appointment? No  Can we respond through MyChart? Yes  Agent: Please be advised that Rx refills may take up to 3 business days. We ask that you follow-up with your pharmacy.

## 2024-06-03 ENCOUNTER — Encounter: Payer: Self-pay | Admitting: Physical Medicine and Rehabilitation

## 2024-06-15 ENCOUNTER — Ambulatory Visit: Payer: Self-pay

## 2024-06-15 VITALS — BP 133/82 | HR 72 | Ht 68.0 in | Wt 172.0 lb

## 2024-06-15 DIAGNOSIS — Z23 Encounter for immunization: Secondary | ICD-10-CM

## 2024-06-15 DIAGNOSIS — G8929 Other chronic pain: Secondary | ICD-10-CM | POA: Diagnosis not present

## 2024-06-15 DIAGNOSIS — F320 Major depressive disorder, single episode, mild: Secondary | ICD-10-CM

## 2024-06-15 DIAGNOSIS — M5442 Lumbago with sciatica, left side: Secondary | ICD-10-CM | POA: Diagnosis not present

## 2024-06-15 DIAGNOSIS — I10 Essential (primary) hypertension: Secondary | ICD-10-CM | POA: Diagnosis not present

## 2024-06-15 MED ORDER — DULOXETINE HCL 30 MG PO CPEP: 30.0000 mg | ORAL_CAPSULE | Freq: Every day | ORAL | 3 refills | Status: DC

## 2024-06-15 MED ORDER — SPIRONOLACTONE 25 MG PO TABS: 25.0000 mg | ORAL_TABLET | Freq: Every day | ORAL | 0 refills | Status: DC

## 2024-06-15 NOTE — Progress Notes (Unsigned)
 Established Patient Office Visit  Subjective   Patient ID: Ruben Duffy, male    DOB: Jan 22, 1966  Age: 58 y.o. MRN: [id number]  Chief Complaint  Patient presents with   Medical Management of Chronic Issues    6 month follow up    HPI Discussed the use of AI scribe software for clinical note transcription with the patient, who gave verbal consent to proceed.  History of Present Illness   Ruben Duffy is a 58 year old male who presents for routine follow-up.  Chronic back pain - Persistent pain involving the entire back, with a focus on the left side - Pain sometimes described as a sensation of the spine being twisted and pressing on a nerve - Steroid injection earlier this year provided only temporary relief - Nerve block previously provided relief for a few months - Chronic pain impacts daily functioning and is a factor in disability application  Testicular discomfort - Sensation of pulling and tugging in the testicles, with a feeling of fullness - Discomfort associated with previous hernia surgeries - History of large testicles during teenage years - Current symptoms possibly related to a nerve issue identified by urology  Wrist pain - History of wrist pain, previously evaluated with imaging showing spots on the wrist - Symptoms have improved since cessation of work - No current significant wrist pain and not pursuing further treatment  Shoulder pain and limited range of motion - History of shoulder dislocation in high school - Occasional pain and limited range of motion in the shoulder - Difficulty with certain movements - Family history of similar shoulder issues in his mother  Depressive symptoms and stress - Occasional marijuana use for depression and pain relief, which helps with motivation and reduces pain - No prescribed medication for depression - Significant stress related to caregiving responsibilities for his mother, who has multiple health issues  including knee pain and osteoporosis - Frequent conflicts at home contribute to elevated stress levels      Patient Active Problem List   Diagnosis Date Noted   Depression, major, single episode, mild (HCC) 06/18/2024   Chronic bilateral low back pain with left-sided sciatica 06/18/2024   Need for influenza vaccination 06/18/2024   Caffeine use 05/06/2024   OAB (overactive bladder) 12/15/2023   Recurrent cold sores 08/26/2023   Hyperlipidemia 05/26/2023   CAD (coronary artery disease) 05/26/2023   Inflammatory arthritis 05/26/2023   Former tobacco use 05/26/2023   Prediabetes 05/26/2023   Left inguinal hernia 11/19/2022   Umbilical hernia without obstruction or gangrene 11/19/2022   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 07/11/2022   Primary osteoarthritis, right shoulder 03/07/2022   Chronic systolic heart failure (HCC) 09/19/2021   New onset of congestive heart failure (HCC) 09/07/2021   Left bundle branch block 05/09/2021   Essential hypertension 05/09/2021   Malignant neoplasm of prostate (HCC) 02/23/2021   ROS    Objective:     BP 133/82   Pulse 72   Ht 5' 8 (1.727 m)   Wt 172 lb (78 kg)   SpO2 96%   BMI 26.15 kg/m  BP Readings from Last 3 Encounters:  06/15/24 133/82  05/20/24 122/75  05/06/24 115/74   Wt Readings from Last 3 Encounters:  06/15/24 172 lb (78 kg)  02/04/24 166 lb (75.3 kg)  12/23/23 169 lb 6.4 oz (76.8 kg)     Physical Exam Vitals and nursing note reviewed.  Constitutional:      Appearance: Normal appearance.  HENT:  Head: Normocephalic.  Eyes:     Extraocular Movements: Extraocular movements intact.     Pupils: Pupils are equal, round, and reactive to light.  Cardiovascular:     Rate and Rhythm: Normal rate and regular rhythm.  Pulmonary:     Effort: Pulmonary effort is normal.     Breath sounds: Normal breath sounds.  Musculoskeletal:     Cervical back: Normal range of motion and neck supple.   Neurological:     Mental Status: He is alert and oriented to person, place, and time.  Psychiatric:        Mood and Affect: Mood normal.        Thought Content: Thought content normal.    No results found for any visits on 06/15/24.  Last CBC Lab Results  Component Value Date   WBC 9.2 06/24/2023   HGB 16.1 06/24/2023   HCT 49.6 06/24/2023   MCV 90.0 06/24/2023   MCH 29.2 06/24/2023   RDW 14.6 06/24/2023   PLT 289 06/24/2023   Last metabolic panel Lab Results  Component Value Date   GLUCOSE 108 (H) 12/23/2023   NA 138 12/23/2023   K 4.3 12/23/2023   CL 104 12/23/2023   CO2 25 12/23/2023   BUN 13 12/23/2023   CREATININE 0.81 12/23/2023   GFRNONAA >60 12/23/2023   CALCIUM  9.9 12/23/2023   PROT 6.4 (L) 09/23/2023   ALBUMIN  3.6 09/23/2023   LABGLOB 2.6 03/29/2022   AGRATIO 1.7 03/29/2022   BILITOT 1.1 09/23/2023   ALKPHOS 47 09/23/2023   AST 20 09/23/2023   ALT 17 09/23/2023   ANIONGAP 9 12/23/2023   Last lipids Lab Results  Component Value Date   CHOL 132 09/23/2023   HDL 57 09/23/2023   LDLCALC 62 09/23/2023   TRIG 66 09/23/2023   CHOLHDL 2.3 09/23/2023   Last hemoglobin A1c Lab Results  Component Value Date   HGBA1C 5.8 (H) 09/23/2023   Last thyroid  functions Lab Results  Component Value Date   TSH 0.748 09/23/2023   Last vitamin D No results found for: 25OHVITD2, 25OHVITD3, VD25OH Last vitamin B12 and Folate No results found for: VITAMINB12, FOLATE    The ASCVD Risk score (Arnett DK, et al., 2019) failed to calculate for the following reasons:   Risk score cannot be calculated because patient has a medical history suggesting prior/existing ASCVD    Assessment & Plan:   Problem List Items Addressed This Visit       Cardiovascular and Mediastinum   Essential hypertension - Primary   Relevant Medications   spironolactone  (ALDACTONE ) 25 MG tablet     Nervous and Auditory   Chronic bilateral low back pain with left-sided  sciatica   Relevant Medications   DULoxetine  (CYMBALTA ) 30 MG capsule     Other   Depression, major, single episode, mild (HCC)   Relevant Medications   DULoxetine  (CYMBALTA ) 30 MG capsule   Need for influenza vaccination   Relevant Orders   Flu vaccine trivalent PF, 6mos and older(Flulaval,Afluria,Fluarix,Fluzone) (Completed)          Return in about 3 months (around 09/14/2024).    Leita Longs, FNP

## 2024-06-16 ENCOUNTER — Encounter: Payer: Self-pay | Admitting: Internal Medicine

## 2024-06-18 DIAGNOSIS — G8929 Other chronic pain: Secondary | ICD-10-CM | POA: Insufficient documentation

## 2024-06-18 DIAGNOSIS — F320 Major depressive disorder, single episode, mild: Secondary | ICD-10-CM | POA: Insufficient documentation

## 2024-06-18 DIAGNOSIS — Z23 Encounter for immunization: Secondary | ICD-10-CM | POA: Insufficient documentation

## 2024-06-18 NOTE — Assessment & Plan Note (Signed)
 Depression, currently not on medication. Discussed use of Cymbalta  for depression, chronic back pain, and neuropathy. Previous use of duloxetine  was discontinued due to interactions with amiodarone . He expressed interest in trying Cymbalta  again. - Start Cymbalta  30 mg daily with 30-day supply and refills. - Monitor for side effects and efficacy.

## 2024-06-18 NOTE — Assessment & Plan Note (Signed)
 Remains adequately controlled on current antihypertensive regimen.

## 2024-06-18 NOTE — Assessment & Plan Note (Signed)
 Chronic back pain with left lumbar radiculopathy Chronic back pain with left-sided lumbar radiculopathy likely due to nerve compression. Previous steroid injection provided temporary relief. Discussed potential for nerve ablation as a longer-term solution. - Consider repeat steroid injection if pain persists.

## 2024-07-02 ENCOUNTER — Ambulatory Visit: Payer: Self-pay | Admitting: Student

## 2024-07-06 ENCOUNTER — Ambulatory Visit: Payer: 59

## 2024-07-06 DIAGNOSIS — I447 Left bundle-branch block, unspecified: Secondary | ICD-10-CM | POA: Diagnosis not present

## 2024-07-07 ENCOUNTER — Ambulatory Visit: Admitting: Student in an Organized Health Care Education/Training Program

## 2024-07-07 LAB — CUP PACEART REMOTE DEVICE CHECK
Battery Remaining Longevity: 67 mo
Battery Remaining Percentage: 71 %
Battery Voltage: 2.98 V
Brady Statistic AP VP Percent: 4.7 %
Brady Statistic AP VS Percent: 1 %
Brady Statistic AS VP Percent: 80 %
Brady Statistic AS VS Percent: 3.6 %
Brady Statistic RA Percent Paced: 1 %
Date Time Interrogation Session: 20250923231818
HighPow Impedance: 63 Ohm
HighPow Impedance: 63 Ohm
Implantable Lead Connection Status: 753985
Implantable Lead Connection Status: 753985
Implantable Lead Connection Status: 753985
Implantable Lead Implant Date: 20230626
Implantable Lead Implant Date: 20230626
Implantable Lead Implant Date: 20230626
Implantable Lead Location: 753858
Implantable Lead Location: 753859
Implantable Lead Location: 753860
Implantable Lead Model: 7122
Implantable Pulse Generator Implant Date: 20230626
Lead Channel Impedance Value: 510 Ohm
Lead Channel Impedance Value: 560 Ohm
Lead Channel Impedance Value: 580 Ohm
Lead Channel Pacing Threshold Amplitude: 0.5 V
Lead Channel Pacing Threshold Amplitude: 0.875 V
Lead Channel Pacing Threshold Amplitude: 1 V
Lead Channel Pacing Threshold Pulse Width: 0.5 ms
Lead Channel Pacing Threshold Pulse Width: 0.5 ms
Lead Channel Pacing Threshold Pulse Width: 0.5 ms
Lead Channel Sensing Intrinsic Amplitude: 11.6 mV
Lead Channel Sensing Intrinsic Amplitude: 2.6 mV
Lead Channel Setting Pacing Amplitude: 2 V
Lead Channel Setting Pacing Amplitude: 2 V
Lead Channel Setting Pacing Amplitude: 2 V
Lead Channel Setting Pacing Pulse Width: 0.5 ms
Lead Channel Setting Pacing Pulse Width: 0.5 ms
Lead Channel Setting Sensing Sensitivity: 0.5 mV
Pulse Gen Serial Number: 8949760

## 2024-07-07 NOTE — Progress Notes (Unsigned)
 Cardiology Office Note   Date:  07/08/2024  ID:  Ruben Duffy, DOB July 06, 1966, MRN [id number] PCP: Ruben Doffing, FNP  [facility] Providers Cardiologist:  Ruben Lesches, MD Electrophysiologist:  Ruben DELENA Primus, MD   History of Present Illness Ruben Duffy is a 58 y.o. male with NICM and LBBB s/p LEFT Abbott CRTD implant (DOI 04/08/22, Dr. Fernande) and PVCs who presents for device and PVC management.  He was last seen by Dr. Fernande on 02/04/2024 for follow-up of CRT-D, LBBB, NICM, and PVCs treated with amiodarone .  He initially did not have any significant improvement following CRT-D implant.  He had moderate chronic dyspnea.  Amiodarone  was discontinued when he had dyspnea.  He had an event recorder on 05/07/2023 with 4.8% PVC burden.  He has had a severely reduced EF since at least 09/07/2021.  At that time his LVEF was 25-30% and his RV function was normal.  MRI that same week however showed showed severe RV dysfunction with an RVEF of 15% and confirmed a severely reduced LVEF estimated at 14%.  There was dyssynchrony 2/2 LBBB but it was not thought to be the main contributor to his LV dysfunction.  Subsequent TTE with stable LV function of 30-35% still with severe LV dilation.  Device interrogation on 12/23/2023 with 90% BVP percentage and 5% PVCs.  Today he presents for to clinic with his mother.  He overall is doing well but is still continuing to have mild to moderate dyspnea on exertion and frequent palpitations.  The palpitations are not bothersome to him but he notes that they are present.  He is no longer on amiodarone  but continues on GDMT.  ROS: DOE, palpitations   Studies Reviewed  ECG review 07/08/24: rhythm strip with AS-BVP, frequent unifocal PVCs with LBBB/inferior axis c/w septal RVOT, not hte same as 2022 PVC which was more c/w AL PM origin  02/04/24: AS-bVP 67, PR 134, QRS 158, QT/c 448/473 03/07/22: NSR 65, PR 174, QRS 196, QT/c 502/522, LBBB 09/19/21:  NSR 78, PR 162, QRS 166, QT/c 424/483, LBBB, isolated PVC with RBBB/right/inferior axis (possible AL PM origin)  30 day monitor  30 day monitor Predominant rhythm: Sinus rhythm Sinus HR: 50 - 123 bpm, AVG 76 bpm < 1% atrial ectopy 18% ventricular ectopy Arrhythmia detected: one NSVT lasting 3 seconds Patient triggered events: A-sensed, V-paced rhythm with PVCs  TTE Result date: 08/08/22 1. Left ventricular ejection fraction, by estimation, is 30 to 35%. Left ventricular ejection fraction by 3D volume is 36 %. The left ventricle has moderately decreased function. The left ventricle demonstrates global hypokinesis. The left ventricular internal cavity size was severely dilated. Left ventricular diastolic parameters are indeterminate. The average left ventricular global longitudinal strain is -13.3 %. The global longitudinal strain is abnormal. 2. Right ventricular systolic function is normal. The right ventricular size is normal. Tricuspid regurgitation signal is inadequate for assessing PA pressure. 3. The mitral valve is abnormal. Mild to moderate mitral valve regurgitation. 4. The aortic valve is tricuspid. There is mild calcification of the aortic valve. Aortic valve regurgitation is not visualized. Aortic valve sclerosis is present, with no evidence of aortic valve stenosis. 5. The inferior vena cava is normal in size with <50% respiratory variability, suggesting right atrial pressure of 8 mmHg.  cMRI w/wo  Result date: 09/11/21 1. Severely dilated LV with EF 14%, diffuse hypokinesis. EF is difficult to quantify by Simpson's rule give septal-lateral dyssynchrony from LBBB, but it is quite low. 2. Mild-moderate  RV dilation with severe systolic dysfunction, EF 15%. 3. Small area of nonspecific basal inferoseptal RV insertion site LGE, this is often seen with pressure/volume overload. 4. Nonspecific mild elevation in extracellular volume percentage, not in the range to suggest cardiac  amyloidosis.  Physical Exam VS:  BP 100/68   Pulse 71   Ht 5' 8 (1.727 m)   Wt 175 lb 9.6 oz (79.7 kg)   SpO2 95%   BMI 26.70 kg/m       Wt Readings from Last 3 Encounters:  07/08/24 175 lb 9.6 oz (79.7 kg)  06/15/24 172 lb (78 kg)  02/04/24 166 lb (75.3 kg)    GEN: Well nourished, well developed in no acute distress CARDIAC: RRR, no murmurs, rubs, gallops RESPIRATORY:  Clear to auscultation without rales, wheezing or rhonchi  ABDOMEN: Soft, non-tender, non-distended EXTREMITIES:  No edema; No deformity   Device information  CRTD: [employer] Assura MP 3369-40C, SN [id number], DOI 04/08/22 RA: SJM Tendril MRI LPA1200M/52 cm, SN [id number], DOI 04/08/22 RV: SJM Durata 7122/65 cm, SN [id number], DOI 04/08/22 LV: SJM Quartet 1458Q/86 cm, SN [id number], DOI 04/08/22  Device interrogation  Result date: 07/08/24 Longevity: 5.6 years DDD 50/140, LV-RV bVP 85%, AP < 1%, 12% PVCs RA: 2.1 mV / 540 ohms / 0.75 V @ 0.5 ms  RV: 11.6 mV / 530 ohms / 0.5 V @ 0.5 ms  LV: 560 ohms (61 ohms) / 1.0 V @ 0.5 ms   ASSESSMENT AND PLAN Ruben Duffy is a 58 y.o. male with NICM and LBBB s/p LEFT Abbott CRTD implant (DOI 04/08/22, Dr. Fernande) and PVCs who presents for device and PVC management.  NICM LBBB s/p LEFT Abbott CRTD PVC NYHA II-III symptoms with CRT-D.  He does not appear volume overloaded.  CRT-D with 85% BiV pacing percentage.  Having frequent unifocal PVCs during device interrogation.  Rhythm strip with LBBB/inferior/basal axis consistent with septal RVOT origin.  Prior PVCs with possible AL papillary muscle origin.  We discussed trying to get better control of his PVC burden to allow improved BiV pacing percentage and at least stabilization of his LVEF with possible improvement if the PVCs are contributing to the cardiomyopathy or at least inhibiting effective CRT.  Options for this would be antiarrhythmic medication however with his severely reduced EF we are limited to amiodarone ,  Ranexa, mexiletine, or possibly sotalol.  He has had issues with amiodarone  before and it was discontinued.  If his dominant PVC morphology is the one I am seeing today then it should be amenable to ablation if he is having enough of a burden.  We reviewed the risk, benefits, and alternatives of PVC ablation including but not limited to major or minor bleeding, vascular access site damage, hematoma, infection, damage to the heart wall requiring a drain or surgery, dangerous arrhythmias, damage to the valves or other conduction system, and death.  The benefits were also reviewed above.  He and his mother were agreeable to proceed.  They would like me to discuss with Dr. Cherrie who is his primary cardiologist to make sure he agrees with the plan.  I will give them a call by next week for scheduling if they would like to proceed.  Discussed treatment options today for PVC management including antiarrhythmic drug therapy and ablation. Discussed risks, recovery and likelihood of success with each treatment strategy. Risk, benefits, and alternatives to EP study and ablation for PVC were discussed. These risks include but are not limited to stroke, bleeding,  vascular damage, tamponade, perforation, damage to the esophagus, lungs, phrenic nerve and other structures, worsening renal function, coronary vasospasm and death.  Discussed potential need for repeat ablation procedures and antiarrhythmic drugs after an initial ablation. The patient understands these risk and would like me to discuss with Dr. Bensimhon and reach back out to him and his mother.    Dispo: will f/u with patient and arrange for possible ablation date if they would like to proceed.   A total of 55 minutes was spent preparing for the patient, reviewing history, performing exam, document encounter, coordinating care and counseling the patient. 30 minutes was spent with direct patient care.   Signed, Ruben DELENA Primus, MD

## 2024-07-07 NOTE — Progress Notes (Signed)
Remote ICD Transmission.

## 2024-07-08 ENCOUNTER — Ambulatory Visit
Attending: Student in an Organized Health Care Education/Training Program | Admitting: Student in an Organized Health Care Education/Training Program

## 2024-07-08 ENCOUNTER — Encounter: Payer: Self-pay | Admitting: Student in an Organized Health Care Education/Training Program

## 2024-07-08 VITALS — BP 100/68 | HR 71 | Ht 68.0 in | Wt 175.6 lb

## 2024-07-08 DIAGNOSIS — I5022 Chronic systolic (congestive) heart failure: Secondary | ICD-10-CM | POA: Diagnosis not present

## 2024-07-08 DIAGNOSIS — I447 Left bundle-branch block, unspecified: Secondary | ICD-10-CM | POA: Diagnosis not present

## 2024-07-08 DIAGNOSIS — I428 Other cardiomyopathies: Secondary | ICD-10-CM | POA: Diagnosis not present

## 2024-07-08 NOTE — Patient Instructions (Signed)
 Medication Instructions:  Your physician recommends that you continue on your current medications as directed. Please refer to the Current Medication list given to you today.  *If you need a refill on your cardiac medications before your next appointment, please call your pharmacy*  Lab Work: None ordered.  If you have labs (blood work) drawn today and your tests are completely normal, you will receive your results only by: MyChart Message (if you have MyChart) OR A paper copy in the mail If you have any lab test that is abnormal or we need to change your treatment, we will call you to review the results.  Testing/Procedures: None ordered.   Follow-Up: At [facility], you and your health needs are our priority.  As part of our continuing mission to provide you with exceptional heart care, our providers are all part of one team.  This team includes your primary Cardiologist (physician) and Advanced Practice Providers or APPs (Physician Assistants and Nurse Practitioners) who all work together to provide you with the care you need, when you need it.  Your next appointment:   3 months with Dr Almetta

## 2024-07-09 NOTE — Progress Notes (Signed)
Remote ICD Transmission.

## 2024-07-11 ENCOUNTER — Other Ambulatory Visit (HOSPITAL_COMMUNITY): Payer: Self-pay | Admitting: Internal Medicine

## 2024-07-11 DIAGNOSIS — I10 Essential (primary) hypertension: Secondary | ICD-10-CM

## 2024-07-12 ENCOUNTER — Telehealth: Payer: Self-pay | Admitting: Student in an Organized Health Care Education/Training Program

## 2024-07-12 DIAGNOSIS — Z01812 Encounter for preprocedural laboratory examination: Secondary | ICD-10-CM

## 2024-07-12 DIAGNOSIS — I493 Ventricular premature depolarization: Secondary | ICD-10-CM

## 2024-07-12 NOTE — Telephone Encounter (Signed)
 Pt called in stating he is returning a call from Dr. Almetta about ablation. Please advise.

## 2024-07-13 NOTE — Telephone Encounter (Signed)
 Dr Almetta has spoken with pt and made aware current of plan.  Per Dr Almetta schedule pt for PVC ablation on 08/23/2024 first case.  Information sent to pt through MyChart

## 2024-07-20 ENCOUNTER — Ambulatory Visit: Payer: Self-pay | Admitting: Cardiovascular Disease

## 2024-07-21 ENCOUNTER — Ambulatory Visit: Admitting: Surgery

## 2024-07-21 ENCOUNTER — Encounter: Payer: Self-pay | Admitting: Surgery

## 2024-07-21 VITALS — BP 107/72 | HR 75 | Temp 97.8°F | Ht 68.0 in | Wt 176.0 lb

## 2024-07-21 DIAGNOSIS — R1032 Left lower quadrant pain: Secondary | ICD-10-CM

## 2024-07-21 DIAGNOSIS — K4090 Unilateral inguinal hernia, without obstruction or gangrene, not specified as recurrent: Secondary | ICD-10-CM

## 2024-07-21 NOTE — Patient Instructions (Signed)
 We have scheduled you for a CT Scan of your Abdomen and Pelvis with contrast. This has been scheduled at [facility] on 07/29/24 . Please arrive there by 3:15pm . If you need to reschedule your Scan, you may do so by calling [phone]. Please let us  know if you reschedule your scan as we have to get authorization from your insurance for this.

## 2024-07-22 NOTE — Progress Notes (Addendum)
 Outpatient Surgical Follow Up   Ruben Duffy is an 58 y.o. male.   Chief Complaint  Patient presents with   Follow-up    Inguinal hernia pain     HPI: Ruben Duffy is 57 year old male 1 year and 1 month out from his original bilateral inguinal hernia repair with mesh.  He did have a recurrence 3 months afterwards. No fevers no chills.  He does have a significant cardiac history HE  Does have ICD and  EF 30%. No recent cardiac issues.  NOw he Endorses some lower abdominal pain sometimes inguinal pain.  It is difficult for him to describe but it seems to be intermittent mild to moderate and dull.  No specific alleviating or aggravating factors.  Sometimes he says that the pain radiates to the groins.  He did have bilateral inguinal hernia repair with redo surgery.  He denies any lumps in his scrotum.  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 Jude Quadra Assura CRT-D   Arthritis    Bilateral inguinal hernia    a.) s/p BILATERAL repair 11/19/2022; b.) recurrent hernia on LEFT   CAD (coronary artery disease) 09/10/2021   a.) R/LHC 09/10/2021: 30% pLCx - med mgmt.   Cardiomegaly    CHF (congestive heart failure) (HCC)    a.) TTE 09/07/2021: EF 25-30%, diff inf HK, mod LV dil, mod LAE, mod-sev MR; b.) R/LHC 09/10/2021: EF 25%, mPA 13, PCWP 5, CO 6.2, CI 3.1; c.) cMRI 09/11/2021: EF 14%, sev LV dil, diff HK; d.) TTE 01/11/2022: EF 25-30%, glob HK, mod LV dil, mod LAE, mild MR, G2DD; e.) CRT-D implanted 04/08/2022; f.) TTE 08/08/2022: EF 30-35%, glob HK, triv MR, G1DD   DDD (degenerative disc disease), lumbar    Emphysema of lung (HCC)    Erectile dysfunction    a.) on PDE5i (sildenafil ) PRN   GERD (gastroesophageal reflux disease)    Hemorrhoids    Hepatitis C virus infection cured after antiviral drug therapy    History of substance abuse (HCC)    a.) THC + crack cocaine   HLD  (hyperlipidemia)    Hypertension    Hypospadias, balanic    LBBB (left bundle branch block) 04/26/2021   a.) noted on preop ECG 04/26/2021   Long term current use of amiodarone     Lower urinary tract symptoms (LUTS)    NICM (nonischemic cardiomyopathy) (HCC)    a.) TTE 09/07/2021: EF 25-30%; b.) R/LHC 09/10/2021: mPA 13, PCWP 5, CO 6.2, CI 3.1; c.) cMRI 09/11/2021: EF 14%, diff HK, RV insertion site LGE; d.) TTE 01/11/2022: EF 25-30% e.) CRT-D implanted 04/08/2022; f.) TTE 08/08/2022: EF 30-35%   Pre-diabetes    Prostate cancer (HCC) 01/08/2021   a.) Gleason 3+4, T2a; b.) s/p brachytherapy 06/05/2021   Umbilical hernia    a.) s/p repair 11/19/2022    Past Surgical History:  Procedure Laterality Date   BIOPSY PROSTATE     BIV ICD INSERTION CRT-D N/A 04/08/2022   Procedure: BIV ICD INSERTION CRT-D;  Surgeon: Fernande Elspeth BROCKS, MD;  Location: [facility] INVASIVE CV LAB;  Service: Cardiovascular;  Laterality: N/A;   CIRCUMCISION N/A 06/05/2021   Procedure: CIRCUMCISION ADULT;  Surgeon: Watt Rush, MD;  Location: [facility];  Service: Urology;  Laterality: N/A;   COLONOSCOPY W/ POLYPECTOMY     HEMORRHOID SURGERY     HERNIA REPAIR     Had 3 fixed,  had re-repair the left groin hernia.   HYPOSPADIAS CORRECTION     INSERTION OF MESH  11/19/2022   Procedure: INSERTION OF MESH;  Surgeon: Jordis Laneta FALCON, MD;  Location: [facility] ORS;  Service: General;;   RADIOACTIVE SEED IMPLANT N/A 06/05/2021   Procedure: RADIOACTIVE SEED IMPLANT/BRACHYTHERAPY IMPLANT;  Surgeon: Watt Rush, MD;  Location: [facility];  Service: Urology;  Laterality: N/A;   RIGHT/LEFT HEART CATH AND CORONARY ANGIOGRAPHY N/A 09/10/2021   Procedure: RIGHT/LEFT HEART CATH AND CORONARY ANGIOGRAPHY;  Surgeon: Cherrie Toribio SAUNDERS, MD;  Location: [facility] INVASIVE CV LAB;  Service: Cardiovascular;  Laterality: N/A;   SPACE OAR INSTILLATION N/A 06/05/2021   Procedure: SPACE OAR INSTILLATION;  Surgeon: Watt Rush, MD;   Location: [facility];  Service: Urology;  Laterality: N/A;   UMBILICAL HERNIA REPAIR N/A 11/19/2022   Procedure: HERNIA REPAIR UMBILICAL ADULT;  Surgeon: Jordis Laneta FALCON, MD;  Location: [facility] ORS;  Service: General;  Laterality: N/A;    Family History  Problem Relation Age of Onset   COPD Mother    Stroke Father    Lung cancer Maternal Aunt    Diabetes Maternal Uncle    Brain cancer Maternal Uncle    Lung cancer Maternal Uncle    Lung cancer Maternal Uncle    Cancer Paternal Grandmother    Diabetes Paternal Grandfather    Colon cancer Neg Hx    Esophageal cancer Neg Hx    Stomach cancer Neg Hx    Pancreatic cancer Neg Hx    Colon polyps Neg Hx    Rectal cancer Neg Hx     Social History:  reports that he quit smoking about 15 years ago. His smoking use included cigarettes. He started smoking about 46 years ago. He has a 52.5 pack-year smoking history. He has been exposed to tobacco smoke. He has never used smokeless tobacco. He reports that he does not currently use alcohol. He reports that he does not currently use drugs after having used the following drugs: Crack cocaine and Marijuana.  Allergies: Not on File  Medications reviewed.    ROS Full ROS performed and is otherwise negative other than what is stated in HPI   BP 107/72   Pulse 75   Temp 97.8 F (36.6 C) (Oral)   Ht 5' 8 (1.727 m)   Wt 176 lb (79.8 kg)   SpO2 95%   BMI 26.76 kg/m   Physical Exam Vitals and nursing note reviewed. Exam conducted with a chaperone present.  Constitutional:      General: He is not in acute distress.    Appearance: Normal appearance.  Pulmonary:     Effort: Pulmonary effort is normal.     Breath sounds: No stridor.  Abdominal:     General: Abdomen is flat. There is no distension.     Palpations: Abdomen is soft. There is no mass.     Tenderness: There is abdominal tenderness. There is no guarding or rebound.     Hernia: No hernia is present.     Comments:  Some minimal tenderness, no evidence of ventral or inguinal henrias  Skin:    General: Skin is warm and dry.     Capillary Refill: Capillary refill takes less than 2 seconds.  Neurological:     General: No focal deficit present.     Mental Status: He is alert and oriented to person, place, and time.  Psychiatric:        Mood and Affect: Mood  normal.        Behavior: Behavior normal.        Thought Content: Thought content normal.        Judgment: Judgment normal.        Assessment/Plan: Lower abdominal pain prior history of bilateral inguinal hernias.  On exam I do not see evidence of inguinal or ventral hernias.  We will interrogate with a CT scan of the abdomen pelvis although possible etiologies to include diverticulitis or bowel issues.  Does not need hospitalization or surgical intervention at this time I personally spent a total of 20 minutes in the care of the patient today including performing a medically appropriate exam/evaluation, counseling and educating, placing orders, referring and communicating with other health care professionals, documenting clinical information in the EHR, independently interpreting and reviewing images studies and coordinating care.   Laneta Luna, MD [facility] General Surgeon

## 2024-07-29 ENCOUNTER — Ambulatory Visit (HOSPITAL_COMMUNITY)

## 2024-07-29 ENCOUNTER — Encounter (HOSPITAL_COMMUNITY): Payer: Self-pay

## 2024-08-02 ENCOUNTER — Encounter (HOSPITAL_COMMUNITY): Payer: Self-pay

## 2024-08-02 ENCOUNTER — Telehealth: Payer: Self-pay

## 2024-08-02 ENCOUNTER — Telehealth (HOSPITAL_COMMUNITY): Payer: Self-pay

## 2024-08-02 DIAGNOSIS — Z01812 Encounter for preprocedural laboratory examination: Secondary | ICD-10-CM

## 2024-08-02 DIAGNOSIS — I493 Ventricular premature depolarization: Secondary | ICD-10-CM

## 2024-08-02 NOTE — Telephone Encounter (Signed)
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 Yes Orders entered? No, >30 days before procedure Letter complete? No, >30 days before procedure Scheduled with cath lab? Yes Any medications to hold? Yes (please list hold instructions): Coreg  x 5 days Labs ordered (CBC, BMET, PT/INR if on warfarin): Yes Mapping system: CARTO (lab 4 or 6) CARTO/OPAL rep notified? Yes Cardiac CT needed? No Dye allergy?N/A Pre-meds ordered and instructions given? N/A Letter method: MyChart H&P: 07/08/24 Device: Yes, ICD - STJ  Follow-up:  Cassie/Angel, please schedule Routine.  Covering RN - please send this message to [employer], EP scheduler, EP Scheduling pool, EP [employer], and CT scheduler ([place] Lynch/Stephanie Mogg), if indicated.

## 2024-08-02 NOTE — Telephone Encounter (Signed)
 Attempted to reach patient to discuss upcoming procedure, no answer. Left VM for patient to return call.

## 2024-08-02 NOTE — Telephone Encounter (Signed)
 Spoke with patient to complete pre-procedure call.     Health status review:  Any new medical conditions, recent signs of acute illness or been started on antibiotics? Yes. Pt c/o lower abdominal pain that radiates to groin areas and sensitivity intensifies with activity. He was seen by general surgery on 10/8. No evidence of ventral or inguinal hernias documented. Pt is scheduled for a CT abd/pelvis on 10/31 to evaluate possible etiology of symptoms. Will follow up on CT results and MD recs.  Any recent hospitalizations or surgeries? No Any new medications started since pre-op visit? No  Follow all medication instructions prior to procedure or the procedure may be rescheduled:    HOLD: Carvedilol  (Coreg ) for 5 days prior to the procedure. Last dose on Tuesday, November 4.    HOLD: Dapagliflozin  (Farxiga ) for 3 days prior to the procedure. Last dose on Thursday, November 06. Essential chronic medications:  No medication should be continued, unless told otherwise. On the morning of your procedure DO NOT take any medication.  Nothing to eat or drink after midnight prior to your procedure.  Pre-procedure testing scheduled: lab work by October 27.  Confirmed patient is scheduled for PVC Ablation on Monday, November 10 with Dr. Dr. Almetta. Instructed patient to arrive at the Main Entrance A at [facility]: [address] [place], [place] [address] and check in at Admitting at 5:30 AM.  Plan to go home the same day, you will only stay overnight if medically necessary.. You MUST have a responsible adult to drive you home and MUST be with you the first 24 hours after you arrive home or your procedure could be cancelled.  Informed patient a nurse will call a day before the procedure to confirm arrival time and ensure instructions are followed.  Patient verbalized understanding to information provided and is agreeable to proceed with procedure.   Advised patient to contact RN Navigator at  [phone], to inform of any new medications started after call or concerns prior to procedure.

## 2024-08-04 DIAGNOSIS — Z01812 Encounter for preprocedural laboratory examination: Secondary | ICD-10-CM | POA: Diagnosis not present

## 2024-08-04 DIAGNOSIS — I493 Ventricular premature depolarization: Secondary | ICD-10-CM | POA: Diagnosis not present

## 2024-08-04 LAB — CBC
Hematocrit: 49.6 % (ref 37.5–51.0)
Hemoglobin: 15.6 g/dL (ref 13.0–17.7)
MCH: 28.5 pg (ref 26.6–33.0)
MCHC: 31.5 g/dL (ref 31.5–35.7)
MCV: 91 fL (ref 79–97)
Platelets: 252 x10E3/uL (ref 150–450)
RBC: 5.48 x10E6/uL (ref 4.14–5.80)
RDW: 12.5 % (ref 11.6–15.4)
WBC: 10.4 x10E3/uL (ref 3.4–10.8)

## 2024-08-04 LAB — BASIC METABOLIC PANEL WITH GFR
BUN/Creatinine Ratio: 14 (ref 9–20)
BUN: 12 mg/dL (ref 6–24)
CO2: 24 mmol/L (ref 20–29)
Calcium: 9.7 mg/dL (ref 8.7–10.2)
Chloride: 104 mmol/L (ref 96–106)
Creatinine, Ser: 0.87 mg/dL (ref 0.76–1.27)
Glucose: 101 mg/dL — ABNORMAL HIGH (ref 70–99)
Potassium: 5 mmol/L (ref 3.5–5.2)
Sodium: 141 mmol/L (ref 134–144)
eGFR: 99 mL/min/1.73 (ref >59)

## 2024-08-11 ENCOUNTER — Other Ambulatory Visit

## 2024-08-13 ENCOUNTER — Ambulatory Visit (HOSPITAL_COMMUNITY)

## 2024-08-13 ENCOUNTER — Encounter (HOSPITAL_COMMUNITY): Payer: Self-pay

## 2024-08-13 NOTE — Telephone Encounter (Signed)
 Received a VM from patient requesting a return call.   Noted on 10/22, the CT abd/pelvis was canceled for today by patient, mentioning insurance conflict.   Attempted to reach patient back, no answer. Left detailed message for patient to return call.

## 2024-08-16 ENCOUNTER — Encounter: Payer: Self-pay | Admitting: Radiology

## 2024-08-16 ENCOUNTER — Telehealth: Payer: Self-pay | Admitting: Surgery

## 2024-08-16 NOTE — Telephone Encounter (Signed)
 Patient reports he plans to call this morning to find out when his CT Abd/pelvis is going to be rescheduled and will call back with an update. He continues to report lower abdominal pain, that radiates to the entire groin area. He feels something has happened with the mesh from his prior hernia repair.   Made patient aware that he most likely will need the CT completed and symptoms resolved before proceeding with PVC ablation, but will confirm with Dr. Almetta. He voiced understanding.

## 2024-08-16 NOTE — Telephone Encounter (Signed)
 Pt has called and wanting to know about his CT that has not been authorized. Please call pt back at [phone]

## 2024-08-16 NOTE — Telephone Encounter (Addendum)
 Reviewed Dr. Garnette recommendations. Will forward to April G. to cancel procedure for now. Once patient knows what is causing symptoms and resolved, can reschedule if needed. Patient made aware of provider recommendations. He verbalized understanding and will be back in touch when ready to reschedule.   Discussed with WENDI Bach with [facility] and was advised the CT scan was authorized but facility was denied. The insurance company wants the patient to go to a free standing facility if the provider cannot prove that it needs to be done in the hospital setting. Reached out to ordering provider, Dr. Jordis and advised per CHARLENA Lesches, CMA, that she will re-fax the new NPI for [facility] so the site can get changed.

## 2024-08-23 ENCOUNTER — Encounter (HOSPITAL_COMMUNITY): Payer: Self-pay

## 2024-08-23 ENCOUNTER — Ambulatory Visit (HOSPITAL_COMMUNITY): Admit: 2024-08-23 | Admitting: Student in an Organized Health Care Education/Training Program

## 2024-08-23 SURGERY — PVC ABLATION
Anesthesia: General

## 2024-08-25 ENCOUNTER — Ambulatory Visit: Payer: Self-pay | Admitting: Cardiovascular Disease

## 2024-08-27 ENCOUNTER — Ambulatory Visit
Admission: RE | Admit: 2024-08-27 | Discharge: 2024-08-27 | Disposition: A | Discharge status: Home / Self Care | Source: Ambulatory Visit | Attending: Surgery | Admitting: Surgery

## 2024-08-27 DIAGNOSIS — K4090 Unilateral inguinal hernia, without obstruction or gangrene, not specified as recurrent: Secondary | ICD-10-CM

## 2024-08-27 DIAGNOSIS — R1032 Left lower quadrant pain: Secondary | ICD-10-CM | POA: Diagnosis not present

## 2024-08-27 MED ORDER — IOPAMIDOL (ISOVUE-300) INJECTION 61%
100.0000 mL | Freq: Once | INTRAVENOUS | Status: AC | PRN
  Administered 2024-08-27: 100 mL via INTRAVENOUS

## 2024-08-30 ENCOUNTER — Telehealth: Payer: Self-pay | Admitting: Student in an Organized Health Care Education/Training Program

## 2024-08-30 NOTE — Telephone Encounter (Signed)
 Pt is requesting a callback regarding him wanting to discuss the ablation being rescheduled now since he's had the CT. Please advise

## 2024-08-30 NOTE — Telephone Encounter (Signed)
 Patient reports that he had abdominal CT scan to evaluate previous hernia repair. His ablation was cancelled until this was completed. He states that there was nothing further needed based on findings from CT scan so he would like to know if he can reschedule his ablation. Will send to Dr. Almetta to advise.

## 2024-09-03 NOTE — Telephone Encounter (Signed)
 Pt calling to f/u on CT results as well as whether he will have ot reschedule ablation or not. Please advise.

## 2024-09-07 NOTE — Telephone Encounter (Signed)
 Dr Almetta has spoken with pt and recommends PVC ablation be scheduled for 09/27/2024.  Pt is agreeable with this date.

## 2024-09-09 ENCOUNTER — Other Ambulatory Visit: Payer: Self-pay

## 2024-09-09 DIAGNOSIS — I10 Essential (primary) hypertension: Secondary | ICD-10-CM

## 2024-09-14 ENCOUNTER — Other Ambulatory Visit: Payer: Self-pay

## 2024-09-20 ENCOUNTER — Other Ambulatory Visit: Payer: Self-pay

## 2024-09-20 DIAGNOSIS — I493 Ventricular premature depolarization: Secondary | ICD-10-CM

## 2024-09-20 DIAGNOSIS — Z01812 Encounter for preprocedural laboratory examination: Secondary | ICD-10-CM

## 2024-09-20 NOTE — Telephone Encounter (Signed)
 Spoke with pt and advised instruction letter has been released to pt's MyChart.  Reviewed instructions.  Pt verbalizes understanding and agrees with current plan.

## 2024-09-20 NOTE — Progress Notes (Signed)
 Pre-procedure Labs ordered

## 2024-09-21 ENCOUNTER — Telehealth (HOSPITAL_COMMUNITY): Payer: Self-pay

## 2024-09-21 ENCOUNTER — Encounter: Admitting: Student in an Organized Health Care Education/Training Program

## 2024-09-21 ENCOUNTER — Telehealth: Payer: Self-pay

## 2024-09-21 NOTE — Telephone Encounter (Signed)
 Spoke with patient to discuss upcoming procedure.   Labs: to be completed.   Any recent signs of acute illness or been started on antibiotics? No. Patient reports his lower abdominal groin pain has improved. It is now mainly triggered after heavy lifting but resolves by the following day.  Any new medications started? No Any medications to hold?  Yes  HOLD: Carvedilol  (Coreg ) for 5 days prior to the procedure. Last dose on 09/22/2024   HOLD: Dapagliflozin  (Farxiga ) for 3 days prior to the procedure. Last dose on 09/23/2024. Medication instructions:  On the morning of your procedure DO NOT take any medication.or the procedure may be rescheduled. Nothing to eat or drink after midnight prior to your procedure.  Confirmed patient is scheduled for PVC Ablation on Monday, December 15 with Dr. Almetta. Instructed patient to arrive at the Main Entrance A at [facility]: [address] [place], [place] [address] and check in at Admitting at 7:30 AM.   Plan to go home the same day, you will only stay overnight if medically necessary. You MUST have a responsible adult to drive you home and MUST be with you the first 24 hours after you arrive home or your procedure could be cancelled.  Informed patient a nurse will call a day before the procedure to confirm arrival time and ensure instructions are followed.  Patient verbalized understanding to all instructions provided and agreed to proceed with procedure.   Advised patient to contact RN Navigator at [phone], to inform of any new medications started after call or concerns prior to procedure.

## 2024-09-21 NOTE — Telephone Encounter (Signed)
-----   Message from Nurse Aldona R sent at 09/20/2024  5:10 PM EST ----- Regarding: 12/15 930 PVC ablation Almetta Important: list procedure date as first item in subject line, followed by procedure type (e.g., 06/25/24 AFib ablation)  Precert:  MD: Almetta Type of ablation: PVC Diagnosis: PVC's CPT code: VT/PVC (06345) Ablation scheduled (date/time): 12/15 930  Procedure:  Added to calendar? Yes Orders entered? Yes Letter complete? Yes Scheduled with cath lab? Yes Any medications to hold? Yes (please list hold instructions): Carvedilol  x 5 days -  other meds routine hold. Labs ordered (CBC, BMET, PT/INR if on warfarin): Yes Mapping system: CARTO (lab 4 or 6) CARTO/OPAL rep notified? Yes Cardiac CT needed? No Dye allergy? N/A Pre-meds ordered and instructions given? No, N/A Letter method: MyChart H&P: 9/25 Device: Yes, ICD - STJ  Follow-up:  Cassie/Angel, please schedule Routine.  Covering RN - please send this message to [employer], EP scheduler, EP Scheduling pool, EP [employer], and CT scheduler (Brittany Lynch/Stephanie Mogg), if indicated.

## 2024-09-22 DIAGNOSIS — Z01812 Encounter for preprocedural laboratory examination: Secondary | ICD-10-CM | POA: Diagnosis not present

## 2024-09-22 DIAGNOSIS — I493 Ventricular premature depolarization: Secondary | ICD-10-CM | POA: Diagnosis not present

## 2024-09-22 LAB — CBC
Hematocrit: 52 % — ABNORMAL HIGH (ref 37.5–51.0)
Hemoglobin: 16.5 g/dL (ref 13.0–17.7)
MCH: 28.8 pg (ref 26.6–33.0)
MCHC: 31.7 g/dL (ref 31.5–35.7)
MCV: 91 fL (ref 79–97)
Platelets: 256 x10E3/uL (ref 150–450)
RBC: 5.72 x10E6/uL (ref 4.14–5.80)
RDW: 12.6 % (ref 11.6–15.4)
WBC: 9.7 x10E3/uL (ref 3.4–10.8)

## 2024-09-22 LAB — BASIC METABOLIC PANEL WITH GFR
BUN/Creatinine Ratio: 13 (ref 9–20)
BUN: 11 mg/dL (ref 6–24)
CO2: 24 mmol/L (ref 20–29)
Calcium: 10.1 mg/dL (ref 8.7–10.2)
Chloride: 101 mmol/L (ref 96–106)
Creatinine, Ser: 0.85 mg/dL (ref 0.76–1.27)
Glucose: 97 mg/dL (ref 70–99)
Potassium: 4.9 mmol/L (ref 3.5–5.2)
Sodium: 139 mmol/L (ref 134–144)
eGFR: 100 mL/min/1.73 (ref >59)

## 2024-09-24 NOTE — Pre-Procedure Instructions (Signed)
Instructed patient on the following items: Arrival time 0730 Nothing to eat or drink after midnight No meds AM of procedure Responsible person to drive you home and stay with you for 24 hrs

## 2024-09-27 ENCOUNTER — Encounter (HOSPITAL_COMMUNITY)
Admission: RE | Disposition: A | Payer: Self-pay | Attending: Student in an Organized Health Care Education/Training Program

## 2024-09-27 ENCOUNTER — Ambulatory Visit (HOSPITAL_COMMUNITY): Payer: Self-pay | Admitting: Registered Nurse

## 2024-09-27 ENCOUNTER — Other Ambulatory Visit: Payer: Self-pay

## 2024-09-27 ENCOUNTER — Ambulatory Visit (HOSPITAL_COMMUNITY)
Admission: RE | Admit: 2024-09-27 | Discharge: 2024-09-27 | Disposition: A | Attending: Student in an Organized Health Care Education/Training Program | Admitting: Student in an Organized Health Care Education/Training Program

## 2024-09-27 DIAGNOSIS — I493 Ventricular premature depolarization: Secondary | ICD-10-CM

## 2024-09-27 DIAGNOSIS — I428 Other cardiomyopathies: Secondary | ICD-10-CM | POA: Diagnosis not present

## 2024-09-27 DIAGNOSIS — I2510 Atherosclerotic heart disease of native coronary artery without angina pectoris: Secondary | ICD-10-CM

## 2024-09-27 DIAGNOSIS — I447 Left bundle-branch block, unspecified: Secondary | ICD-10-CM | POA: Insufficient documentation

## 2024-09-27 DIAGNOSIS — Z87891 Personal history of nicotine dependence: Secondary | ICD-10-CM | POA: Diagnosis not present

## 2024-09-27 DIAGNOSIS — I509 Heart failure, unspecified: Secondary | ICD-10-CM | POA: Diagnosis not present

## 2024-09-27 DIAGNOSIS — J449 Chronic obstructive pulmonary disease, unspecified: Secondary | ICD-10-CM | POA: Diagnosis not present

## 2024-09-27 DIAGNOSIS — Z8546 Personal history of malignant neoplasm of prostate: Secondary | ICD-10-CM | POA: Insufficient documentation

## 2024-09-27 DIAGNOSIS — K759 Inflammatory liver disease, unspecified: Secondary | ICD-10-CM | POA: Insufficient documentation

## 2024-09-27 DIAGNOSIS — K219 Gastro-esophageal reflux disease without esophagitis: Secondary | ICD-10-CM | POA: Diagnosis not present

## 2024-09-27 DIAGNOSIS — I110 Hypertensive heart disease with heart failure: Secondary | ICD-10-CM | POA: Insufficient documentation

## 2024-09-27 HISTORY — PX: PVC ABLATION: EP1236

## 2024-09-27 LAB — POCT ACTIVATED CLOTTING TIME
Activated Clotting Time: 312 s
Activated Clotting Time: 312 s
Activated Clotting Time: 327 s
Activated Clotting Time: 322 s

## 2024-09-27 SURGERY — PVC ABLATION
Anesthesia: Monitor Anesthesia Care

## 2024-09-27 MED ORDER — SODIUM CHLORIDE 0.9% FLUSH: 3.0000 mL | Freq: Two times a day (BID) | INTRAVENOUS | Status: DC

## 2024-09-27 MED ORDER — FENTANYL CITRATE (PF) 100 MCG/2ML IJ SOLN
INTRAMUSCULAR | Status: AC
  Filled 2024-09-27: qty 2

## 2024-09-27 MED ORDER — FENTANYL CITRATE (PF) 100 MCG/2ML IJ SOLN
INTRAMUSCULAR | Status: DC | PRN
  Administered 2024-09-27: 13:00:00 50 ug via INTRAVENOUS
  Administered 2024-09-27: 11:00:00 25 ug via INTRAVENOUS
  Administered 2024-09-27: 11:00:00 50 ug via INTRAVENOUS
  Administered 2024-09-27: 12:00:00 25 ug via INTRAVENOUS
  Administered 2024-09-27: 13:00:00 50 ug via INTRAVENOUS
  Administered 2024-09-27 (×4): 25 ug via INTRAVENOUS

## 2024-09-27 MED ORDER — MIDAZOLAM HCL 2 MG/2ML IJ SOLN
INTRAMUSCULAR | Status: AC
  Filled 2024-09-27: qty 2

## 2024-09-27 MED ORDER — CEFAZOLIN SODIUM-DEXTROSE 2-4 GM/100ML-% IV SOLN
INTRAVENOUS | Status: AC
  Filled 2024-09-27: qty 100

## 2024-09-27 MED ORDER — SODIUM CHLORIDE 0.9 % IV SOLN: 250.0000 mL | INTRAVENOUS | Status: DC | PRN

## 2024-09-27 MED ORDER — PROTAMINE SULFATE 10 MG/ML IV SOLN
INTRAVENOUS | Status: DC | PRN
  Administered 2024-09-27: 13:00:00 10 mg via INTRAVENOUS
  Administered 2024-09-27: 13:00:00 20 mg via INTRAVENOUS
  Administered 2024-09-27: 13:00:00 10 mg via INTRAVENOUS

## 2024-09-27 MED ORDER — BUPIVACAINE HCL (PF) 0.25 % IJ SOLN
INTRAMUSCULAR | Status: AC
  Filled 2024-09-27: qty 30

## 2024-09-27 MED ORDER — HEPARIN SODIUM (PORCINE) 1000 UNIT/ML IJ SOLN
INTRAMUSCULAR | Status: DC | PRN
  Administered 2024-09-27: 11:00:00 1000 [IU] via INTRAVENOUS

## 2024-09-27 MED ORDER — HEPARIN SODIUM (PORCINE) 1000 UNIT/ML IJ SOLN
INTRAMUSCULAR | Status: AC
  Filled 2024-09-27: qty 10

## 2024-09-27 MED ORDER — SODIUM CHLORIDE 0.9 % IV SOLN: INTRAVENOUS | Status: DC | PRN

## 2024-09-27 MED ORDER — SODIUM CHLORIDE 0.9% FLUSH: 3.0000 mL | INTRAVENOUS | Status: DC | PRN

## 2024-09-27 MED ORDER — HEPARIN (PORCINE) IN NACL 1000-0.9 UT/500ML-% IV SOLN
INTRAVENOUS | Status: DC | PRN
  Administered 2024-09-27 (×2): 500 mL

## 2024-09-27 MED ORDER — CEFAZOLIN SODIUM-DEXTROSE 2-3 GM-%(50ML) IV SOLR
INTRAVENOUS | Status: DC | PRN
  Administered 2024-09-27: 10:00:00 2 g via INTRAVENOUS

## 2024-09-27 MED ORDER — MIDAZOLAM HCL (PF) 2 MG/2ML IJ SOLN
INTRAMUSCULAR | Status: DC | PRN
  Administered 2024-09-27 (×2): 1 mg via INTRAVENOUS

## 2024-09-27 MED ORDER — LIDOCAINE HCL (PF) 1 % IJ SOLN
INTRAMUSCULAR | Status: DC | PRN
  Administered 2024-09-27: 10:00:00 60 mL via SUBCUTANEOUS

## 2024-09-27 MED ORDER — HEPARIN SODIUM (PORCINE) 1000 UNIT/ML IJ SOLN
INTRAMUSCULAR | Status: DC | PRN
  Administered 2024-09-27 (×2): 3000 [IU] via INTRAVENOUS
  Administered 2024-09-27: 11:00:00 15000 [IU] via INTRAVENOUS

## 2024-09-27 SURGICAL SUPPLY — 16 items
CATH GE 8FR SOUNDSTAR (CATHETERS) IMPLANT
CATH OPTRLL 48 DF HIGH DEN MAP (CATHETERS) IMPLANT
CATH SMTCH THERMOCOOL SF DF (CATHETERS) IMPLANT
CATH WEBSTER BI DIR CS D-F CRV (CATHETERS) IMPLANT
CLOSURE PERCLOSE PROSTYLE (Vascular Products) IMPLANT
DEVICE CLOSURE ANGIOSEAL 8FR (Vascular Products) IMPLANT
PACK EP LF (CUSTOM PROCEDURE TRAY) ×1 IMPLANT
PAD DEFIB RADIO PHYSIO CONN (PAD) ×1 IMPLANT
PATCH CARTO3 (PAD) IMPLANT
SHEATH CARTO VIZIGO MED CURVE (SHEATH) IMPLANT
SHEATH PINNACLE 7F 10CM (SHEATH) IMPLANT
SHEATH PINNACLE 8F 10CM (SHEATH) IMPLANT
SHEATH PINNACLE 9F 10CM (SHEATH) IMPLANT
SHEATH PROBE COVER 6X72 (BAG) IMPLANT
SHEATH VENTRAX 8.5FR 95 LGT (SHEATH) IMPLANT
TUBING SMART ABLATE COOLFLOW (TUBING) IMPLANT

## 2024-09-27 NOTE — H&P (Addendum)
 Date: 09/27/24 ID:  Ruben Duffy, DOB September 22, 1966, MRN [id number] PCP: Bevely Doffing, FNP  [facility] Providers Cardiologist:  Dorn Lesches, MD Electrophysiologist:  Donnice LABOR Primus, MD    History of Present Illness Ruben Duffy is a 58 y.o. male with NICM and LBBB s/p LEFT Abbott CRTD implant (DOI 04/08/22, Dr. Fernande) and PVCs who presents for device and PVC management.   He was last seen by Dr. Fernande on 02/04/2024 for follow-up of CRT-D, LBBB, NICM, and PVCs treated with amiodarone .  He initially did not have any significant improvement following CRT-D implant.  He had moderate chronic dyspnea.  Amiodarone  was discontinued when he had dyspnea.  He had an event recorder on 05/07/2023 with 4.8% PVC burden.  He has had a severely reduced EF since at least 09/07/2021.  At that time his LVEF was 25-30% and his RV function was normal.  MRI that same week however showed showed severe RV dysfunction with an RVEF of 15% and confirmed a severely reduced LVEF estimated at 14%.  There was dyssynchrony 2/2 LBBB but it was not thought to be the main contributor to his LV dysfunction.  Subsequent TTE with stable LV function of 30-35% still with severe LV dilation.   Device interrogation on 12/23/2023 with 90% BVP percentage and 5% PVCs.   Today he presents for to clinic with his mother.  He overall is doing well but is still continuing to have mild to moderate dyspnea on exertion and frequent palpitations.  The palpitations are not bothersome to him but he notes that they are present.  He is no longer on amiodarone  but continues on GDMT.   ROS: DOE, palpitations    Studies Reviewed   ECG review 07/08/24: rhythm strip with AS-BVP, frequent unifocal PVCs with LBBB/inferior axis c/w septal RVOT, not hte same as 2022 PVC which was more c/w AL PM origin  02/04/24: AS-bVP 67, PR 134, QRS 158, QT/c 448/473 03/07/22: NSR 65, PR 174, QRS 196, QT/c 502/522, LBBB 09/19/21: NSR 78, PR 162, QRS  166, QT/c 424/483, LBBB, isolated PVC with RBBB/right/inferior axis (possible AL PM origin)  30 day monitor  30 day monitor Predominant rhythm: Sinus rhythm Sinus HR: 50 - 123 bpm, AVG 76 bpm < 1% atrial ectopy 18% ventricular ectopy Arrhythmia detected: one NSVT lasting 3 seconds Patient triggered events: A-sensed, V-paced rhythm with PVCs   TTE Result date: 08/08/22 1. Left ventricular ejection fraction, by estimation, is 30 to 35%. Left ventricular ejection fraction by 3D volume is 36 %. The left ventricle has moderately decreased function. The left ventricle demonstrates global hypokinesis. The left ventricular internal cavity size was severely dilated. Left ventricular diastolic parameters are indeterminate. The average left ventricular global longitudinal strain is -13.3 %. The global longitudinal strain is abnormal. 2. Right ventricular systolic function is normal. The right ventricular size is normal. Tricuspid regurgitation signal is inadequate for assessing PA pressure. 3. The mitral valve is abnormal. Mild to moderate mitral valve regurgitation. 4. The aortic valve is tricuspid. There is mild calcification of the aortic valve. Aortic valve regurgitation is not visualized. Aortic valve sclerosis is present, with no evidence of aortic valve stenosis. 5. The inferior vena cava is normal in size with <50% respiratory variability, suggesting right atrial pressure of 8 mmHg.   cMRI w/wo  Result date: 09/11/21 1. Severely dilated LV with EF 14%, diffuse hypokinesis. EF is difficult to quantify by Simpson's rule give septal-lateral dyssynchrony from LBBB, but it is quite  low. 2. Mild-moderate RV dilation with severe systolic dysfunction, EF 15%. 3. Small area of nonspecific basal inferoseptal RV insertion site LGE, this is often seen with pressure/volume overload. 4. Nonspecific mild elevation in extracellular volume percentage, not in the range to suggest cardiac amyloidosis.    Physical Exam VS:  BP 100/68   Pulse 71   Ht 5' 8 (1.727 m)   Wt 175 lb 9.6 oz (79.7 kg)   SpO2 95%   BMI 26.70 kg/m          Wt Readings from Last 3 Encounters:  07/08/24 175 lb 9.6 oz (79.7 kg)  06/15/24 172 lb (78 kg)  02/04/24 166 lb (75.3 kg)    GEN: Well nourished, well developed in no acute distress CARDIAC: RRR, no murmurs, rubs, gallops RESPIRATORY:  Clear to auscultation without rales, wheezing or rhonchi  ABDOMEN: Soft, non-tender, non-distended EXTREMITIES:  No edema; No deformity   Device information  CRTD: [employer] Assura MP 3369-40C, SN [id number], DOI 04/08/22 RA: SJM Tendril MRI LPA1200M/52 cm, SN [id number], DOI 04/08/22 RV: SJM Durata 7122/65 cm, SN [id number], DOI 04/08/22 LV: SJM Quartet 1458Q/86 cm, SN [id number], DOI 04/08/22   Device interrogation  Result date: 07/08/24 Longevity: 5.6 years DDD 50/140, LV-RV bVP 85%, AP < 1%, 12% PVCs RA: 2.1 mV / 540 ohms / 0.75 V @ 0.5 ms  RV: 11.6 mV / 530 ohms / 0.5 V @ 0.5 ms  LV: 560 ohms (61 ohms) / 1.0 V @ 0.5 ms    ASSESSMENT AND PLAN Ruben Duffy is a 58 y.o. male with NICM and LBBB s/p LEFT Abbott CRTD implant (DOI 04/08/22, Dr. Fernande) and PVCs who presents for device and PVC management.   NICM LBBB s/p LEFT Abbott CRTD PVC NYHA II-III symptoms with CRT-D.  He does not appear volume overloaded.  CRT-D with 85% BiV pacing percentage.  Having frequent unifocal PVCs during device interrogation.  Rhythm strip with LBBB/inferior/basal axis consistent with septal RVOT origin.  Prior PVCs with possible AL papillary muscle origin.  We discussed trying to get better control of his PVC burden to allow improved BiV pacing percentage and at least stabilization of his LVEF with possible improvement if the PVCs are contributing to the cardiomyopathy or at least inhibiting effective CRT.  Options for this would be antiarrhythmic medication however with his severely reduced EF we are limited to amiodarone , Ranexa,  mexiletine, or possibly sotalol.  He has had issues with amiodarone  before and it was discontinued.  If his dominant PVC morphology is the one I am seeing today then it should be amenable to ablation if he is having enough of a burden.  We reviewed the risk, benefits, and alternatives of PVC ablation including but not limited to major or minor bleeding, vascular access site damage, hematoma, infection, damage to the heart wall requiring a drain or surgery, dangerous arrhythmias, damage to the valves or other conduction system, and death.  The benefits were also reviewed above.  He and his mother were agreeable to proceed.  They would like me to discuss with Dr. Cherrie who is his primary cardiologist to make sure he agrees with the plan.  I will give them a call by next week for scheduling if they would like to proceed.   Discussed treatment options today for PVC management including antiarrhythmic drug therapy and ablation. Discussed risks, recovery and likelihood of success with each treatment strategy. Risk, benefits, and alternatives to EP study and ablation for PVC were  discussed. These risks include but are not limited to stroke, bleeding, vascular damage, tamponade, perforation, damage to the esophagus, lungs, phrenic nerve and other structures, worsening renal function, coronary vasospasm and death.  Discussed potential need for repeat ablation procedures and antiarrhythmic drugs after an initial ablation. The patient understands these risk and would like me to discuss with Dr. Bensimhon and reach back out to him and his mother.    Dispo: will f/u with patient and arrange for possible ablation date if they would like to proceed.

## 2024-09-27 NOTE — Op Note (Addendum)
 Procedure - Electrophysiology Study and PVC Ablation  Ruben Duffy 09/27/2024   Attending: Adina Primus, MD  PVC / Substrate type: NICM    Presenting Rhythm: Ventricular bigeminy/trigeminy, PR 216, AH 114, HV 36, QRS 110, QT 410, QTc 437, RR 880, ICD theraies disabled   Complications: none  Anesthesia: MAC  Estimated Blood Loss: less than 100 mL  Procedures done: Intracardiac Catheter Ablation of Ventricular Tachycardia (CPT [phone]) Transseptal catheterization (CPT 93462) Stimulation and pacing after drug infusion (CPT [phone]) Intracardiac echocardiography (CPT [phone])  Access: - RFV: 8 Fr short sheath->Medium curl Vizigo sheath  - RFV: 9 Fr short sheath-> 8 Fr Soundstar ICE catheter  Comments: - Normal baseline conduction intervals - Frequent PVCs on arrival with rS V1 with V2-V3 transition, inferior axis, rS I and Q aVL - Venous access pre-closed with Perclose ProStyle sutures  - Three dimensional anatomy of right ventricle, right ventricular outflow tract and pulmonary artery identified with CARTOSOUND and used to guide fast anatomic map   - Optrell mapping catheter was used to map the RVOT - Septal RVOT adjacent to the R/L coronary cusps was -5 ms with QS unipolar signal  - Pacemapping at this site with 92 % match to clinic VT/PVC - Ablation with immediate PVC suppression however return of PVCs within 10 minutes, additional ablation applied along the RVOT septum with repeated suppression and recurrence - With the earliest recording of -5 ms on the RVOT site heparin  was given and retrograde aortic access was obtained with a standard access needle and a 7 Fr short sheath  - Heparin  administered to target ACT 350 +/- 25 seconds - Ventrax M-S retrograde aortic sheath was exchanged for the 7 Fr short sheath was advanced to the LVOT and advanced across the aortic valve without difficulty  - Optrell was advanced to the LV and the earliest signal was -20 ms pre-QRS with QS  signal immediately under the RCC and adjacent to the RVOT septal ablation  - Pacemapping at this site with 96% match to clinical PVC - Ablation at site of earliest activation resulted in bursts of clinical PVC morphology - No clinical PVCs following ablation for 20 minutes with subsequent intermittent recurrent PVCs - Mapping with ST S/F D/F catheter in the CS with QS morphology but on time with surface ECG, one lesion applied (20 W) however not in close proximity to area of suppression under the RCC - Additional mapping of the RCC/LCC commissure both superior and inferior to the valve.  - Ablation performed (40-45 W, F' >500) inferior to the valve  - Additional ICE contours obtained of the LCC and LM take off - Additional ablation (20-30 W) performed superior to the valve with early signal in the [facility] - With each ablation along the inferior aspect of the RCC there was suppression of the clinical PVC - PVC with 82% match and different different morphology intermittently observed post ablation  - ICE without any effusion post ablation  - Protamine  given for reversal  - RFV sheaths removed with hemostasis with Perclose sutures  - RFA sheath removed with AngioSeal for hemostasis  - PVC mid septal/LV summit origin with suppression from both the RVOT and LVOT ablation and without immediate post procedure recurrence however likely to recur with mid myocardial source   - Final intervals: AS-BVP, PR 172, QRS 196, QT 532, RR 824, ICD therapies re-enabled   Plan: - Routine post-procedure care with bedrest for 2 hours - Discharge home 2 hours post procedure   -  Depending on PVC burden post ablation will consider starting mexiletine at next clinic visit  - Follow-up in clinic   [name], MD [facility]  Cardiac Electrophysiology

## 2024-09-27 NOTE — Interval H&P Note (Signed)
 History and Physical Interval Note:  09/27/2024 9:52 AM  Ruben Duffy  has presented today for surgery, with the diagnosis of PVC's.  The various methods of treatment have been discussed with the patient and family. After consideration of risks, benefits and other options for treatment, the patient has consented to  Procedures: PVC ABLATION (N/A) as a surgical intervention.  The patient's history has been reviewed, patient examined, no change in status, stable for surgery.  I have reviewed the patient's chart and labs.  Questions were answered to the patient's satisfaction.     [name]

## 2024-09-27 NOTE — Anesthesia Preprocedure Evaluation (Addendum)
 Anesthesia Evaluation  Patient identified by MRN, date of birth, ID band Patient awake    Reviewed: Allergy & Precautions, H&P , NPO status , Patient's Chart, lab work & pertinent test results  History of Anesthesia Complications Negative for: history of anesthetic complications  Airway Mallampati: II  TM Distance: >3 FB Neck ROM: Full    Dental no notable dental hx.    Pulmonary COPD, former smoker   Pulmonary exam normal breath sounds clear to auscultation       Cardiovascular hypertension, + CAD and +CHF  Normal cardiovascular exam+ dysrhythmias + Cardiac Defibrillator  Rhythm:Regular Rate:Normal  06/2023 IMPRESSIONS     1. Left ventricular ejection fraction, by estimation, is 30 to 35%. Left  ventricular ejection fraction by 3D volume is 36 %. The left ventricle has  moderately decreased function. The left ventricle demonstrates global  hypokinesis. The left ventricular  internal cavity size was severely dilated. Left ventricular diastolic  parameters are indeterminate. The average left ventricular global  longitudinal strain is -13.3 %. The global longitudinal strain is  abnormal.   2. Right ventricular systolic function is normal. The right ventricular  size is normal. Tricuspid regurgitation signal is inadequate for assessing  PA pressure.   3. The mitral valve is abnormal. Mild to moderate mitral valve  regurgitation.   4. The aortic valve is tricuspid. There is mild calcification of the  aortic valve. Aortic valve regurgitation is not visualized. Aortic valve  sclerosis is present, with no evidence of aortic valve stenosis.   5. The inferior vena cava is normal in size with <50% respiratory  variability, suggesting right atrial pressure of 8 mmHg.     Neuro/Psych neg Seizures PSYCHIATRIC DISORDERS  Depression    negative neurological ROS     GI/Hepatic ,GERD  ,,(+) Hepatitis -, C  Endo/Other  negative  endocrine ROS    Renal/GU negative Renal ROS   Prostate cancer    Musculoskeletal  (+) Arthritis ,    Abdominal   Peds negative pediatric ROS (+)  Hematology negative hematology ROS (+)   Anesthesia Other Findings   Reproductive/Obstetrics negative OB ROS                              Anesthesia Physical Anesthesia Plan  ASA: 4  Anesthesia Plan: MAC   Post-op Pain Management: Minimal or no pain anticipated   Induction: Intravenous  PONV Risk Score and Plan: 1 and Ondansetron , Dexamethasone  and Treatment may vary due to age or medical condition  Airway Management Planned: Natural Airway and Simple Face Mask  Additional Equipment: None  Intra-op Plan:   Post-operative Plan:   Informed Consent: I have reviewed the patients History and Physical, chart, labs and discussed the procedure including the risks, benefits and alternatives for the proposed anesthesia with the patient or authorized representative who has indicated his/her understanding and acceptance.     Dental advisory given  Plan Discussed with: CRNA  Anesthesia Plan Comments:          Anesthesia Quick Evaluation

## 2024-09-27 NOTE — Transfer of Care (Signed)
 Immediate Anesthesia Transfer of Care Note  Patient: Ruben Duffy  Procedure(s) Performed: PVC ABLATION  Patient Location: Short Stay  Anesthesia Type:MAC  Level of Consciousness: awake, alert , and oriented  Airway & Oxygen Therapy: Patient Spontanous Breathing  Post-op Assessment: Report given to RN and Post -op Vital signs reviewed and stable  Post vital signs: Reviewed and stable  Last Vitals:  Vitals Value Taken Time  BP 131/91 09/27/24 13:45  Temp    Pulse 61 09/27/24 13:50  Resp 17 09/27/24 13:50  SpO2 96 % 09/27/24 13:50  Vitals shown include unfiled device data.  Last Pain:  Vitals:   09/27/24 1340  TempSrc:   PainSc: 0-No pain         Complications: No notable events documented.

## 2024-09-27 NOTE — Anesthesia Procedure Notes (Signed)
 Procedure Name: MAC Date/Time: 09/27/2024 10:03 AM  Performed by: Vertie Arthea RAMAN, CRNAPre-anesthesia Checklist: Patient identified, Emergency Drugs available, Suction available, Patient being monitored and Timeout performed Patient Re-evaluated:Patient Re-evaluated prior to induction Oxygen Delivery Method: Nasal cannula

## 2024-09-27 NOTE — Anesthesia Postprocedure Evaluation (Addendum)
 Anesthesia Post Note  Patient: Ruben Duffy  Procedure(s) Performed: PVC ABLATION     Patient location during evaluation: PACU Anesthesia Type: MAC Level of consciousness: awake and alert Pain management: pain level controlled Vital Signs Assessment: post-procedure vital signs reviewed and stable Respiratory status: spontaneous breathing, nonlabored ventilation, respiratory function stable and patient connected to nasal cannula oxygen Cardiovascular status: blood pressure returned to baseline and stable Postop Assessment: no apparent nausea or vomiting Anesthetic complications: no   No notable events documented.  Last Vitals:  Vitals:   09/27/24 1340 09/27/24 1345  BP: (!) 135/94 (!) 131/91  Pulse: 61 (!) 58  Resp: 12 (!) 22  Temp:    SpO2: 98% 95%    Last Pain:  Vitals:   09/27/24 1345  TempSrc:   PainSc: 0-No pain                 Thom JONELLE Peoples

## 2024-09-27 NOTE — Progress Notes (Signed)
 Discharge instructions reviewed with patient and mother at bedside. Denies questions concerns. PT tolerated PO intake. Ambulated in the hallway, was able to void without difficulty. Seen by MD incision site remains clean dry and intact. No s/s of complications. PT escorted from the unit via wheel chair to personal vehicle.

## 2024-09-28 ENCOUNTER — Encounter (HOSPITAL_COMMUNITY): Payer: Self-pay | Admitting: Student in an Organized Health Care Education/Training Program

## 2024-09-28 ENCOUNTER — Telehealth (HOSPITAL_COMMUNITY): Payer: Self-pay

## 2024-09-28 NOTE — Telephone Encounter (Signed)
 Spoke with patient to complete post procedure follow up call.  Patient reports no complications with groin sites.   Instructions reviewed with patient:  Remove large bandage at puncture site after 24 hours. It is normal to have bruising, tenderness, mild swelling, and a pea or marble sized lump/knot at the groin site which can take up to three months to resolve.  Get help right away if you notice sudden swelling at the puncture site.  Check your puncture site every day for signs of infection: fever, redness, swelling, pus drainage, warmth, foul odor or excessive pain. If this occurs, please call [phone], to speak with the RN Navigator. Get help right away if your puncture site is bleeding and the bleeding does not stop after applying firm pressure to the area.  You may continue to have skipped beats during the first several months after your procedure.   You will follow up with Dr. Almetta 1 month after your procedure.  Activity restrictions reviewed.  Patient verbalized understanding to all instructions provided.

## 2024-09-30 ENCOUNTER — Ambulatory Visit

## 2024-09-30 DIAGNOSIS — M5442 Lumbago with sciatica, left side: Secondary | ICD-10-CM

## 2024-09-30 DIAGNOSIS — F320 Major depressive disorder, single episode, mild: Secondary | ICD-10-CM

## 2024-09-30 DIAGNOSIS — G8929 Other chronic pain: Secondary | ICD-10-CM | POA: Diagnosis not present

## 2024-09-30 MED ORDER — DULOXETINE HCL 30 MG PO CPEP: 30.0000 mg | ORAL_CAPSULE | Freq: Every evening | ORAL | 1 refills | Status: AC

## 2024-09-30 NOTE — Progress Notes (Unsigned)
° °  Established Patient Office Visit  Subjective   Patient ID: Ruben Duffy, male    DOB: 1966/01/27  Age: 58 y.o. 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/2023   Hyperlipidemia 05/26/2023   CAD (coronary artery disease) 05/26/2023   Inflammatory arthritis 05/26/2023   Former tobacco use 05/26/2023   Prediabetes 05/26/2023   Left inguinal hernia 11/19/2022   Umbilical hernia without obstruction or gangrene 11/19/2022   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 07/11/2022   Primary osteoarthritis, right shoulder 03/07/2022   Chronic systolic heart failure (HCC) 09/19/2021   New onset of congestive heart failure (HCC) 09/07/2021   Left bundle branch block 05/09/2021   Essential hypertension 05/09/2021   Malignant neoplasm of prostate (HCC) 02/23/2021      ROS    Objective:     BP 95/67   Pulse 78   Ht 5' 8 (1.727 m)   Wt 174 lb 1.9 oz (79 kg)   SpO2 95%   BMI 26.47 kg/m  BP Readings from Last 3 Encounters:  09/30/24 95/67  09/27/24 131/83  07/21/24 107/72   Wt Readings from Last 3 Encounters:  09/30/24 174 lb 1.9 oz (79 kg)  09/27/24 170 lb (77.1 kg)  07/21/24 176 lb (79.8 kg)     Physical Exam   No results found for any visits on 09/30/24.  {Labs (Optional):23779}  The ASCVD Risk score (Arnett DK, et al., 2019) failed to calculate for the following reasons:   Risk score cannot be calculated because patient has a medical history suggesting prior/existing ASCVD   * - Cholesterol units were assumed    Assessment & Plan:    Problem List Items Addressed This Visit   None   No follow-ups on file.    Leita Longs, FNP

## 2024-10-03 NOTE — Assessment & Plan Note (Signed)
-   Stable with Cymbalta  30 mg daily.  Refills provided.

## 2024-10-03 NOTE — Assessment & Plan Note (Signed)
 Chronic pain exacerbated by movement. Previous injections provided short-term relief. - Follow up with orthopedic specialist for further evaluation and management.

## 2024-10-05 ENCOUNTER — Ambulatory Visit: Payer: 59

## 2024-10-05 DIAGNOSIS — I428 Other cardiomyopathies: Secondary | ICD-10-CM | POA: Diagnosis not present

## 2024-10-05 LAB — CUP PACEART REMOTE DEVICE CHECK
Battery Remaining Longevity: 63 mo
Battery Remaining Percentage: 67 %
Battery Voltage: 2.98 V
Brady Statistic AP VP Percent: 1 %
Brady Statistic AP VS Percent: 1 %
Brady Statistic AS VP Percent: 96 %
Brady Statistic AS VS Percent: 1 %
Brady Statistic RA Percent Paced: 1 %
Date Time Interrogation Session: 20251223044953
HighPow Impedance: 68 Ohm
HighPow Impedance: 68 Ohm
Implantable Lead Connection Status: 753985
Implantable Lead Connection Status: 753985
Implantable Lead Connection Status: 753985
Implantable Lead Implant Date: 20230626
Implantable Lead Implant Date: 20230626
Implantable Lead Implant Date: 20230626
Implantable Lead Location: 753858
Implantable Lead Location: 753859
Implantable Lead Location: 753860
Implantable Lead Model: 7122
Implantable Pulse Generator Implant Date: 20230626
Lead Channel Impedance Value: 480 Ohm
Lead Channel Impedance Value: 510 Ohm
Lead Channel Impedance Value: 530 Ohm
Lead Channel Pacing Threshold Amplitude: 0.625 V
Lead Channel Pacing Threshold Amplitude: 0.75 V
Lead Channel Pacing Threshold Amplitude: 1.25 V
Lead Channel Pacing Threshold Pulse Width: 0.5 ms
Lead Channel Pacing Threshold Pulse Width: 0.5 ms
Lead Channel Pacing Threshold Pulse Width: 0.5 ms
Lead Channel Sensing Intrinsic Amplitude: 11.6 mV
Lead Channel Sensing Intrinsic Amplitude: 2 mV
Lead Channel Setting Pacing Amplitude: 2 V
Lead Channel Setting Pacing Amplitude: 2 V
Lead Channel Setting Pacing Amplitude: 2 V
Lead Channel Setting Pacing Pulse Width: 0.5 ms
Lead Channel Setting Pacing Pulse Width: 0.5 ms
Lead Channel Setting Sensing Sensitivity: 0.5 mV
Pulse Gen Serial Number: 8949760

## 2024-10-06 NOTE — Progress Notes (Signed)
 Remote ICD Transmission

## 2024-10-13 ENCOUNTER — Ambulatory Visit: Payer: Self-pay | Admitting: Cardiovascular Disease

## 2024-10-22 ENCOUNTER — Ambulatory Visit
Attending: Student in an Organized Health Care Education/Training Program | Admitting: Student in an Organized Health Care Education/Training Program

## 2024-10-22 ENCOUNTER — Encounter: Payer: Self-pay | Admitting: Student in an Organized Health Care Education/Training Program

## 2024-10-22 VITALS — BP 124/76 | HR 70 | Ht 68.0 in | Wt 173.0 lb

## 2024-10-22 DIAGNOSIS — I428 Other cardiomyopathies: Secondary | ICD-10-CM | POA: Insufficient documentation

## 2024-10-22 DIAGNOSIS — I493 Ventricular premature depolarization: Secondary | ICD-10-CM | POA: Diagnosis present

## 2024-10-22 LAB — CUP PACEART INCLINIC DEVICE CHECK
Battery Remaining Longevity: 62 mo
Brady Statistic RA Percent Paced: 0.05 %
Brady Statistic RV Percent Paced: 98 %
Date Time Interrogation Session: 20260109165013
HighPow Impedance: 63 Ohm
Implantable Lead Connection Status: 753985
Implantable Lead Connection Status: 753985
Implantable Lead Connection Status: 753985
Implantable Lead Implant Date: 20230626
Implantable Lead Implant Date: 20230626
Implantable Lead Implant Date: 20230626
Implantable Lead Location: 753858
Implantable Lead Location: 753859
Implantable Lead Location: 753860
Implantable Lead Model: 7122
Implantable Pulse Generator Implant Date: 20230626
Lead Channel Impedance Value: 525 Ohm
Lead Channel Impedance Value: 562.5 Ohm
Lead Channel Impedance Value: 562.5 Ohm
Lead Channel Pacing Threshold Amplitude: 0.5 V
Lead Channel Pacing Threshold Amplitude: 0.75 V
Lead Channel Pacing Threshold Amplitude: 0.75 V
Lead Channel Pacing Threshold Amplitude: 0.75 V
Lead Channel Pacing Threshold Amplitude: 1 V
Lead Channel Pacing Threshold Pulse Width: 0.5 ms
Lead Channel Pacing Threshold Pulse Width: 0.5 ms
Lead Channel Pacing Threshold Pulse Width: 0.5 ms
Lead Channel Pacing Threshold Pulse Width: 0.5 ms
Lead Channel Pacing Threshold Pulse Width: 0.5 ms
Lead Channel Sensing Intrinsic Amplitude: 11.6 mV
Lead Channel Sensing Intrinsic Amplitude: 2.3 mV
Lead Channel Setting Pacing Amplitude: 2 V
Lead Channel Setting Pacing Amplitude: 2 V
Lead Channel Setting Pacing Amplitude: 2 V
Lead Channel Setting Pacing Pulse Width: 0.5 ms
Lead Channel Setting Pacing Pulse Width: 0.5 ms
Lead Channel Setting Sensing Sensitivity: 0.5 mV
Pulse Gen Serial Number: 8949760

## 2024-10-22 NOTE — Patient Instructions (Signed)

## 2024-10-22 NOTE — Progress Notes (Unsigned)
 " Cardiology Office Note   Date: 10/22/24 ID:  Keithon Mccoin Helbing, DOB 1966-03-16, MRN [id number] PCP: Bevely Doffing, FNP  [facility] Providers Cardiologist:  Dorn Lesches, MD Electrophysiologist:  Donnice LABOR Primus, MD   History of Present Illness Ruben Duffy is a 59 y.o. male with NICM and LBBB s/p LEFT Abbott CRTD implant (DOI 04/08/22, Dr. Fernande) and PVCs who presents for device and PVC management.   Discussed the use of AI scribe software for clinical note transcription with the patient, who gave verbal consent to proceed. History of Present Illness Ruben Duffy is a 59 year old male with cardiac arrhythmia who presents for follow-up after PVC ablation.  He has experienced significant improvement in symptoms following ablation with improvement in CRT. Previously, he had frequent PVCs, which have now reduced from 20%->1.5%. Additionally, his BVP has increased from 80%->98%. The majority of his PVCs were LV summit and suppressed from the septal RVOT but ultimately terminated with ablation on either side of the LCC.  He no longer experiences shortness of breath with exertion.  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 Left ventricular ejection fraction, by estimation, is 30 to 35%. Left  ventricular ejection fraction by 3D volume is 36 %. The left ventricle has  moderately decreased function. The left ventricle demonstrates global  hypokinesis. The left ventricular  internal cavity size was severely  dilated. Left ventricular diastolic  parameters are indeterminate. The average left ventricular global  longitudinal strain is -13.3 %. The global longitudinal strain is  abnormal.   2. Right ventricular systolic function is normal. The right ventricular  size is normal. Tricuspid regurgitation signal is inadequate for assessing  PA pressure.   3. The mitral valve is abnormal. Mild to moderate mitral valve  regurgitation.   4. The aortic valve is tricuspid. There is mild calcification of the  aortic valve. Aortic valve regurgitation is not visualized. Aortic valve  sclerosis is present, with no evidence of aortic valve stenosis.   5. The inferior vena cava is normal in size with <50% respiratory  variability, suggesting right atrial pressure of 8 mmHg.   Physical Exam VS:  BP 124/76 (BP Location: Right Arm, Cuff Size: Normal)   Pulse 70   Ht 5' 8 (1.727 m)   Wt 173 lb (78.5 kg)   SpO2 96%   BMI 26.30 kg/m       Wt Readings from Last 3 Encounters:  10/22/24 173 lb (78.5 kg)  09/30/24 174 lb 1.9 oz (79 kg)  09/27/24 170 lb (77.1 kg)    GEN: Well nourished, well developed in no acute distress NECK: No JVD; No carotid bruits CARDIAC: RRR, no murmurs, rubs, gallops RESPIRATORY:  Clear to auscultation without rales, wheezing or rhonchi  ABDOMEN: Soft, non-tender, non-distended EXTREMITIES:  No edema; No deformity  SKIN: LEFT infraclavicular incision well-healed, no erosion or erythema  ASSESSMENT AND PLAN Lando A Pfund is a 59 y.o. male with NICM and LBBB s/p LEFT Abbott CRTD implant (DOI  04/08/22, Dr. Fernande) and PVCs who presents for device and PVC management.  Assessment & Plan Nonischemic cardiomyopathy Significant symptom improvement post-procedure. Device battery life at two-thirds (longevity 5.2-5.4 yrs). - Follow-up in one year.  Premature ventricular contractions PVCs reduced from 20% to 1% post-procedure. Pacing efficiency improved to 96-97%. Feels much better now,  SOB has significantly improved. 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 Clinical PVC ablated without recurrence.    Dispo: RTC 1 yr  Signed, [name], MD  "

## 2024-10-23 ENCOUNTER — Other Ambulatory Visit: Payer: Self-pay | Admitting: Urology

## 2024-10-27 ENCOUNTER — Telehealth: Payer: Self-pay

## 2024-10-27 ENCOUNTER — Encounter: Payer: Self-pay | Admitting: Physical Medicine and Rehabilitation

## 2024-10-27 NOTE — Telephone Encounter (Signed)
 Return call to pt. Pt state's he is having lower abdomen pain that is radiated down to his groin area. Pt state he had a hernia and reach out to his general surgeon and was told he need to follow up with his urologist. Pt is aware a message will be sent to MD and his concerns will be address at his upcoming appointment.

## 2024-11-01 ENCOUNTER — Other Ambulatory Visit: Payer: Self-pay

## 2024-11-01 ENCOUNTER — Other Ambulatory Visit

## 2024-11-01 DIAGNOSIS — C61 Malignant neoplasm of prostate: Secondary | ICD-10-CM

## 2024-11-02 LAB — PSA, TOTAL AND FREE
PSA, Free: <0.02 ng/mL
Prostate Specific Ag, Serum: <0.1 ng/mL (ref 0.0–4.0)

## 2024-11-03 ENCOUNTER — Other Ambulatory Visit: Payer: Self-pay

## 2024-11-03 DIAGNOSIS — N3281 Overactive bladder: Secondary | ICD-10-CM

## 2024-11-03 NOTE — Telephone Encounter (Signed)
" ° ° ° °  Called pt to make him aware Rx requested sent to MD for approval.   "

## 2024-11-04 MED ORDER — SOLIFENACIN SUCCINATE 5 MG PO TABS: 5.0000 mg | ORAL_TABLET | Freq: Every day | ORAL | 11 refills | Status: AC

## 2024-11-08 ENCOUNTER — Ambulatory Visit: Admitting: Urology

## 2024-11-09 ENCOUNTER — Ambulatory Visit: Admitting: Urology

## 2024-11-09 VITALS — BP 102/61 | HR 69

## 2024-11-09 DIAGNOSIS — Z8546 Personal history of malignant neoplasm of prostate: Secondary | ICD-10-CM | POA: Diagnosis not present

## 2024-11-09 DIAGNOSIS — Z08 Encounter for follow-up examination after completed treatment for malignant neoplasm: Secondary | ICD-10-CM

## 2024-11-09 DIAGNOSIS — C61 Malignant neoplasm of prostate: Secondary | ICD-10-CM

## 2024-11-09 LAB — URINALYSIS, ROUTINE W REFLEX MICROSCOPIC
Bilirubin, UA: NEGATIVE
Ketones, UA: NEGATIVE
Leukocytes,UA: NEGATIVE
Nitrite, UA: NEGATIVE
Protein,UA: NEGATIVE
RBC, UA: NEGATIVE
Specific Gravity, UA: 1.01 (ref 1.005–1.030)
Urobilinogen, Ur: 0.2 mg/dL (ref 0.2–1.0)
pH, UA: 6.5 (ref 5.0–7.5)

## 2024-11-09 NOTE — Progress Notes (Signed)
 "  Assessment: 1.  History of grade group 2 prostate cancer, status post brachytherapy in August 2022, PSA still undetectable  2.  Lower urinary tract symptoms, stable  3.  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  I do not think he has any GU cause for this pain  Plan: 1.  Causative factors of his left inguinal pain discussed.  I suggest he see his general surgeon for further concerns regarding this  2.  Reassurance regarding his PSA and his cancer-free status  3.  Follow-up here at scheduled appointment in August with Dr. Sherrilee  HPI:  1.27.2026: His appointments moved up because he wants to be seen for left inguinal pain.  He had left inguinal hernia repair in 2025.  This was done in [place].  Since then he has had persistent left inguinal pain with radiation into with his left testicle.  Better when lying down, worse when he is up and moving around.  Minimal right testicular pain.  Had injection of his left inguinal area by his general surgeon in the past with resolution of his pain.  It came back shortly thereafter though.  He has no upper abdominal complaints.  He has no lower urinary tract complaints.  He does have a history of prostate cancer, had brachytherapy in August 2022.  PSA last week undetectable.  No blood in urine, no blood in stool.   11/06/23: Atreus returns today in f/u.  His PSA in 6/24 was down further to 0.2.  He has had hernia repairs since his last visit.  He did some lifting and has had a small bulge occur under the left mesh.  His IPSS is 14 with nocturia x 3.  He has frequency and urgency.  He is having pain in the scrotum since then.  He drinks 4 cups of coffee in the morning and then will have the frequent. He has 3+ glucose in his urine today on Farxiga .   10/03/22: Nery presents today with a recent history of left inguinal pain.  He was seen in the ER with the complaint of pain and an inguinal bulge, but the bulge had resolved  on exam along with the pain when he was seen.   He is back with pain and can feel gas bubble through the area and the pain is relieved when he passes gas.    04/04/22: Delvon returns today in f/u.  His PSA continues to fall and is down to 0.3 from 0.7.  He continues to have moderate LUTS with an IPSS of 18.  The frequency is worse with coffee and he can have UUI.  He remains on silodosin  8mg .  He has had no hematuria.  He has some fecal urgency.  He has no weight loss or bone pain.     01/10/22: Williom returns today in f/u.  He has previously been seen in [place] for his history of T2a Nx Mx GG2 prostate cancer treated with seeds on 05/15/21.  He had a circumcision at that time as well.  He continues to have moderate to severe LUTS with an IPSS of 20.  He had some burning over the last few days.  He has had no hematuria.  He remains on tamsulosin  but hasn't had much improvement.  He had CHF in November and is seeing Dr. Bensimhon for that.   He has some recurrent SOB over the last few days.  He has no edema.   His PSA was down to  0.69 in January from 1.57 preop.  He is doing ok with the circumcision but he still has some redundant skin.   UA today has 3+ glucose but is otherwise clear.   His Hgb A1c was only 6.0 on 09/08/21.   He has some polydipsia on the Farxiga .     ROS:  ROS:  A complete review of systems was performed.  All systems are negative except for pertinent findings as noted.   Review of Systems  Constitutional:  Positive for malaise/fatigue.  Eyes:  Positive for blurred vision.  Gastrointestinal:  Positive for diarrhea.  Musculoskeletal:  Positive for back pain and joint pain.  Skin:  Positive for itching.  Neurological:  Positive for weakness and headaches.  Endo/Heme/Allergies:  Positive for polydipsia. Bruises/bleeds easily.  Psychiatric/Behavioral:  Positive for memory loss.     No Known Allergies  Outpatient Encounter Medications as of 11/09/2024  Medication Sig    carvedilol  (COREG ) 6.25 MG tablet Take 1 tablet (6.25 mg total) by mouth 2 (two) times daily with a meal.   DULoxetine  (CYMBALTA ) 30 MG capsule Take 1 capsule (30 mg total) by mouth at bedtime.   FARXIGA  10 MG TABS tablet Take 1 tablet (10 mg total) by mouth daily.   folic acid  (FOLVITE ) 1 MG tablet Take 1 tablet (1 mg total) by mouth daily.   Multiple Vitamin (MULTIVITAMIN WITH MINERALS) TABS tablet Take 1 tablet by mouth in the morning.   omeprazole (PRILOSEC OTC) 20 MG tablet Take 20 mg by mouth daily before breakfast.   rosuvastatin  (CRESTOR ) 10 MG tablet Take 1 tablet (10 mg total) by mouth daily.   sacubitril -valsartan  (ENTRESTO ) 24-26 MG Take 1 tablet by mouth 2 (two) times daily.   solifenacin  (VESICARE ) 5 MG tablet Take 1 tablet (5 mg total) by mouth daily.   spironolactone  (ALDACTONE ) 25 MG tablet Take 1 tablet by mouth once daily   triamcinolone  (KENALOG ) 0.025 % cream Apply 1 Application topically 2 (two) times daily as needed (skin irritation.).   valACYclovir  (VALTREX ) 500 MG tablet Take 1 tablet (500 mg total) by mouth 2 (two) times daily as needed.   No facility-administered encounter medications on file as of 11/09/2024.    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 Jude Quadra Assura CRT-D   Arthritis    Bilateral inguinal hernia    a.) s/p BILATERAL repair 11/19/2022; b.) recurrent hernia on LEFT   CAD (coronary artery disease) 09/10/2021   a.) R/LHC 09/10/2021: 30% pLCx - med mgmt.   Cardiomegaly    CHF (congestive heart failure) (HCC)    a.) TTE 09/07/2021: EF 25-30%, diff inf HK, mod LV dil, mod LAE, mod-sev MR; b.) R/LHC 09/10/2021: EF 25%, mPA 13, PCWP 5, CO 6.2, CI 3.1; c.) cMRI 09/11/2021: EF 14%, sev LV dil, diff HK; d.) TTE 01/11/2022: EF 25-30%, glob HK, mod LV dil, mod LAE, mild MR, G2DD; e.) CRT-D implanted 04/08/2022; f.) TTE 08/08/2022: EF 30-35%, glob HK, triv MR, G1DD   DDD  (degenerative disc disease), lumbar    Emphysema of lung (HCC)    Erectile dysfunction    a.) on PDE5i (sildenafil ) PRN   GERD (gastroesophageal reflux disease)    Hemorrhoids    Hepatitis C virus infection cured after antiviral drug therapy    History of substance abuse (HCC)    a.) THC + crack cocaine   HLD (hyperlipidemia)    Hypertension    Hypospadias, balanic  LBBB (left bundle branch block) 04/26/2021   a.) noted on preop ECG 04/26/2021   Long term current use of amiodarone     Lower urinary tract symptoms (LUTS)    NICM (nonischemic cardiomyopathy) (HCC)    a.) TTE 09/07/2021: EF 25-30%; b.) R/LHC 09/10/2021: mPA 13, PCWP 5, CO 6.2, CI 3.1; c.) cMRI 09/11/2021: EF 14%, diff HK, RV insertion site LGE; d.) TTE 01/11/2022: EF 25-30% e.) CRT-D implanted 04/08/2022; f.) TTE 08/08/2022: EF 30-35%   Pre-diabetes    Prostate cancer (HCC) 01/08/2021   a.) Gleason 3+4, T2a; b.) s/p brachytherapy 06/05/2021   Umbilical hernia    a.) s/p repair 11/19/2022    Past Surgical History:  Procedure Laterality Date   BIOPSY PROSTATE     BIV ICD INSERTION CRT-D N/A 04/08/2022   Procedure: BIV ICD INSERTION CRT-D;  Surgeon: Fernande Elspeth BROCKS, MD;  Location: [facility] INVASIVE CV LAB;  Service: Cardiovascular;  Laterality: N/A;   CIRCUMCISION N/A 06/05/2021   Procedure: CIRCUMCISION ADULT;  Surgeon: Watt Rush, MD;  Location: [facility];  Service: Urology;  Laterality: N/A;   COLONOSCOPY W/ POLYPECTOMY     HEMORRHOID SURGERY     HERNIA REPAIR     Had 3 fixed, had re-repair the left groin hernia.   HYPOSPADIAS CORRECTION     INSERTION OF MESH  11/19/2022   Procedure: INSERTION OF MESH;  Surgeon: Jordis Laneta FALCON, MD;  Location: [facility] ORS;  Service: General;;   PVC ABLATION N/A 09/27/2024   Procedure: PVC ABLATION;  Surgeon: Almetta Donnice LABOR, MD;  Location: [facility] INVASIVE CV LAB;  Service: Cardiovascular;  Laterality: N/A;   RADIOACTIVE SEED IMPLANT N/A 06/05/2021   Procedure:  RADIOACTIVE SEED IMPLANT/BRACHYTHERAPY IMPLANT;  Surgeon: Watt Rush, MD;  Location: [facility];  Service: Urology;  Laterality: N/A;   RIGHT/LEFT HEART CATH AND CORONARY ANGIOGRAPHY N/A 09/10/2021   Procedure: RIGHT/LEFT HEART CATH AND CORONARY ANGIOGRAPHY;  Surgeon: Cherrie Toribio SAUNDERS, MD;  Location: [facility] INVASIVE CV LAB;  Service: Cardiovascular;  Laterality: N/A;   SPACE OAR INSTILLATION N/A 06/05/2021   Procedure: SPACE OAR INSTILLATION;  Surgeon: Watt Rush, MD;  Location: [facility];  Service: Urology;  Laterality: N/A;   UMBILICAL HERNIA REPAIR N/A 11/19/2022   Procedure: HERNIA REPAIR UMBILICAL ADULT;  Surgeon: Jordis Laneta FALCON, MD;  Location: [facility] ORS;  Service: General;  Laterality: N/A;    Social History   Socioeconomic History   Marital status: Single    Spouse name: Not on file   Number of children: 1   Years of education: Not on file   Highest education level: GED or equivalent  Occupational History   Occupation: [employer]    Comment: estate agent  Tobacco Use   Smoking status: Former    Current packs/day: 0.00    Average packs/day: 1.8 packs/day for 30.0 years (52.5 ttl pk-yrs)    Types: Cigarettes    Start date: 70    Quit date: 09/13/2008    Years since quitting: 16.1    Passive exposure: Past   Smokeless tobacco: Never  Vaping Use   Vaping status: Never Used  Substance and Sexual Activity   Alcohol use: Not Currently   Drug use: Not Currently    Types: Crack cocaine, Marijuana   Sexual activity: Not Currently  Other Topics Concern   Not on file  Social History Narrative   Lives with mother   Social Drivers of Health   Tobacco Use: Medium Risk (10/22/2024)  Patient History    Smoking Tobacco Use: Former    Smokeless Tobacco Use: Never    Passive Exposure: Past  Physicist, Medical Strain: Medium Risk (09/26/2024)   Overall Financial Resource Strain (CARDIA)    Difficulty of Paying Living Expenses:  Somewhat hard  Food Insecurity: Food Insecurity Present (09/26/2024)   Epic    Worried About Programme Researcher, Broadcasting/film/video in the Last Year: Sometimes true    Ran Out of Food in the Last Year: Sometimes true  Transportation Needs: No Transportation Needs (09/26/2024)   Epic    Lack of Transportation (Medical): No    Lack of Transportation (Non-Medical): No  Physical Activity: Inactive (09/26/2024)   Exercise Vital Sign    Days of Exercise per Week: 1 day    Minutes of Exercise per Session: 0 min  Stress: Stress Concern Present (09/26/2024)   [employer] of Occupational Health - Occupational Stress Questionnaire    Feeling of Stress: To some extent  Social Connections: Socially Isolated (09/26/2024)   Social Connection and Isolation Panel    Frequency of Communication with Friends and Family: Once a week    Frequency of Social Gatherings with Friends and Family: Once a week    Attends Religious Services: Never    Database Administrator or Organizations: No    Attends Engineer, Structural: Not on file    Marital Status: Divorced  Intimate Partner Violence: Not on file  Depression (PHQ2-9): Low Risk (11/26/2023)   Depression (PHQ2-9)    PHQ-2 Score: 1  Alcohol Screen: Low Risk (11/20/2023)   Alcohol Screen    Last Alcohol Screening Score (AUDIT): 2  Housing: Unknown (09/26/2024)   Epic    Unable to Pay for Housing in the Last Year: Patient declined    Number of Times Moved in the Last Year: 0    Homeless in the Last Year: No  Utilities: Not At Risk (06/24/2023)   AHC Utilities    Threatened with loss of utilities: No  Health Literacy: Adequate Health Literacy (06/24/2023)   B1300 Health Literacy    Frequency of need for help with medical instructions: Never    Family History  Problem Relation Age of Onset   COPD Mother    Stroke Father    Lung cancer Maternal Aunt    Diabetes Maternal Uncle    Brain cancer Maternal Uncle    Lung cancer Maternal Uncle    Lung cancer  Maternal Uncle    Cancer Paternal Grandmother    Diabetes Paternal Grandfather    Colon cancer Neg Hx    Esophageal cancer Neg Hx    Stomach cancer Neg Hx    Pancreatic cancer Neg Hx    Colon polyps Neg Hx    Rectal cancer Neg Hx        Objective: Vitals:   11/09/24 1343  BP: 102/61  Pulse: 69    Physical exam:  Abdomen: Multiple well-healed surgical trocar sites, none with hernia.  I do not feel any inguinal hernias.  There is tenderness in his left inguinal region. GU: Phallus circumcised, no lesions.  Scrotal skin normal.  Testicles normal bilaterally.  Rectal not performed.  I reviewed CT images from November of this year with the patient and his mother.  I do not see inguinal hernias.  There is what seems like low density material in the right inguinal canal, however.   Lab Results:  PSA No results found for: PSA No results found for: TESTOSTERONE Lab  Results  Component Value Date   PSA1 <0.1 11/01/2024   PSA1 <0.1 04/28/2024   PSA1 <0.1 11/06/2023        Studies/Results: No results found. Recent Results (from the past 2160 hours)  CBC     Status: Abnormal   Collection Time: 09/22/24  9:31 AM  Result Value Ref Range   WBC 9.7 3.4 - 10.8 x10E3/uL   RBC 5.72 4.14 - 5.80 x10E6/uL   Hemoglobin 16.5 13.0 - 17.7 g/dL   Hematocrit 47.9 (H) 62.4 - 51.0 %   MCV 91 79 - 97 fL   MCH 28.8 26.6 - 33.0 pg   MCHC 31.7 31.5 - 35.7 g/dL   RDW 87.3 88.3 - 84.5 %   Platelets 256 150 - 450 x10E3/uL  Basic metabolic panel with GFR     Status: None   Collection Time: 09/22/24  9:31 AM  Result Value Ref Range   Glucose 97 70 - 99 mg/dL   BUN 11 6 - 24 mg/dL   Creatinine, Ser 9.14 0.76 - 1.27 mg/dL   eGFR 899 >40 fO/fpw/8.26   BUN/Creatinine Ratio 13 9 - 20   Sodium 139 134 - 144 mmol/L   Potassium 4.9 3.5 - 5.2 mmol/L   Chloride 101 96 - 106 mmol/L   CO2 24 20 - 29 mmol/L   Calcium  10.1 8.7 - 10.2 mg/dL  POCT Activated clotting time     Status: None    Collection Time: 09/27/24 11:36 AM  Result Value Ref Range   Activated Clotting Time 312 seconds    Comment: Reference range 74-137 seconds for patients not on anticoagulant therapy.  POCT Activated clotting time     Status: None   Collection Time: 09/27/24 12:06 PM  Result Value Ref Range   Activated Clotting Time 327 seconds    Comment: Reference range 74-137 seconds for patients not on anticoagulant therapy.  POCT Activated clotting time     Status: None   Collection Time: 09/27/24 12:29 PM  Result Value Ref Range   Activated Clotting Time 312 seconds    Comment: Reference range 74-137 seconds for patients not on anticoagulant therapy.  POCT Activated clotting time     Status: None   Collection Time: 09/27/24  1:12 PM  Result Value Ref Range   Activated Clotting Time 322 seconds    Comment: Reference range 74-137 seconds for patients not on anticoagulant therapy.  CUP PACEART REMOTE DEVICE CHECK     Status: None   Collection Time: 10/05/24  4:49 AM  Result Value Ref Range   Date Time Interrogation Session [id number]    Pulse Generator Manufacturer [facility]    Pulse Gen Model 3369-40C Marelyn Bud MP    Pulse Gen Serial Number [id number]    Clinic Name [facility]    Implantable Pulse Generator Type Cardiac Resynch Therapy Defibulator    Implantable Pulse Generator Implant Date 79769373    Implantable Lead Manufacturer [facility]    Implantable Lead Model 1458Q Quartet    Implantable Lead Serial Number [id number]    Implantable Lead Implant Date 79769373    Implantable Lead Location Detail 1 UNKNOWN    Implantable Lead Location [id number]    Implantable Lead Connection Status [id number]    Implantable Lead Manufacturer [facility]    Implantable Lead Model [id number] Durata    Implantable Lead Serial Number [id number]    Implantable Lead Implant Date 79769373    Implantable Lead Location Detail 1 UNKNOWN    Implantable Lead Location [id number]  Implantable Lead Connection Status [id number]    Implantable  Lead Manufacturer [facility]    Implantable Lead Model LPA1200M Tendril MRI    Implantable Lead Serial Number [id number]    Implantable Lead Implant Date 79769373    Implantable Lead Location Detail 1 UNKNOWN    Implantable Lead Location [id number]    Implantable Lead Connection Status [phone]    Lead Channel Setting Sensing Sensitivity 0.5 mV   Lead Channel Setting Sensing Adaptation Mode Adaptive Sensing    Lead Channel Setting Pacing Amplitude 2.0 V   Lead Channel Setting Pacing Pulse Width 0.5 ms   Lead Channel Setting Pacing Amplitude 2.0 V   Lead Channel Setting Pacing Pulse Width 0.5 ms   Lead Channel Setting Pacing Amplitude 2.0 V   Lead Channel Setting Pacing Capture Mode Adaptive Capture    Zone Setting Status Active    Zone Setting Status Active    Zone Setting Status Active    Lead Channel Status NULL    Lead Channel Impedance Value 510 ohm   Lead Channel Pacing Threshold Amplitude 1.25 V   Lead Channel Pacing Threshold Pulse Width 0.5 ms   Lead Channel Status NULL    Lead Channel Impedance Value 530 ohm   Lead Channel Sensing Intrinsic Amplitude 2.0 mV   Lead Channel Pacing Threshold Amplitude 0.75 V   Lead Channel Pacing Threshold Pulse Width 0.5 ms   Lead Channel Status NULL    Lead Channel Impedance Value 480 ohm   Lead Channel Sensing Intrinsic Amplitude 11.6 mV   Lead Channel Pacing Threshold Amplitude 0.625 V   Lead Channel Pacing Threshold Pulse Width 0.5 ms   HighPow Impedance 68 ohm   HighPow Impedance 68 ohm   HighPow Imped Status NULL    HighPow Imped Status NULL    Battery Status MOS    Battery Remaining Longevity 63 mo   Battery Remaining Percentage 67.0 %   Battery Voltage 2.98 V   Brady Statistic RA Percent Paced 1.0 %   Brady Statistic AP VP Percent 1.0 %   Brady Statistic AS VP Percent 96.0 %   Brady Statistic AP VS Percent 1.0 %   Brady Statistic AS VS Percent 1.0 %  CUP PACEART INCLINIC DEVICE CHECK     Status: None   Collection Time: 10/22/24  4:50  PM  Result Value Ref Range   Date Time Interrogation Session [id number]    Pulse Generator Manufacturer [facility]    Pulse Gen Model 3369-40C Quadra Assura MP    Pulse Gen Serial Number [id number]    Clinic Name [facility]    Implantable Pulse Generator Type Cardiac Resynch Therapy Defibulator    Implantable Pulse Generator Implant Date 79769373    Implantable Lead Manufacturer [facility]    Implantable Lead Model 1458Q Quartet    Implantable Lead Serial Number [id number]    Implantable Lead Implant Date 79769373    Implantable Lead Location Detail 1 UNKNOWN    Implantable Lead Location [id number]    Implantable Lead Connection Status [id number]    Implantable Lead Manufacturer [facility]    Implantable Lead Model 7122 Durata    Implantable Lead Serial Number [id number]    Implantable Lead Implant Date 79769373    Implantable Lead Location Detail 1 UNKNOWN    Implantable Lead Location [id number]    Implantable Lead Connection Status [id number]    Implantable Lead Manufacturer [facility]    Implantable Lead Model [id number] Tendril MRI    Implantable Lead Serial Number [id number]  Implantable Lead Implant Date 79769373    Implantable Lead Location Detail 1 UNKNOWN    Implantable Lead Location [id number]    Implantable Lead Connection Status [id number]    Lead Channel Setting Sensing Sensitivity 0.5 mV   Lead Channel Setting Pacing Amplitude 2.0 V   Lead Channel Setting Pacing Pulse Width 0.5 ms   Lead Channel Setting Pacing Amplitude 2.0 V   Lead Channel Setting Pacing Pulse Width 0.5 ms   Lead Channel Setting Pacing Amplitude 2.0 V   Lead Channel Setting Pacing Capture Mode Adaptive Capture    Zone Setting Status Active    Zone Setting Status Active    Zone Setting Status Active    Lead Channel Impedance Value 525.0 ohm   Lead Channel Sensing Intrinsic Amplitude 2.3 mV   Lead Channel Pacing Threshold Amplitude 0.75 V   Lead Channel Pacing Threshold Pulse Width 0.5 ms   Lead Channel Pacing Threshold Amplitude 0.75  V   Lead Channel Pacing Threshold Pulse Width 0.5 ms   Lead Channel Impedance Value 562.5 ohm   Lead Channel Sensing Intrinsic Amplitude 11.6 mV   Lead Channel Pacing Threshold Amplitude 0.75 V   Lead Channel Pacing Threshold Pulse Width 0.5 ms   Lead Channel Pacing Threshold Amplitude 0.5 V   Lead Channel Pacing Threshold Pulse Width 0.5 ms   HighPow Impedance 63.0 ohm   Lead Channel Impedance Value 562.5 ohm   Lead Channel Pacing Threshold Amplitude 1.0 V   Lead Channel Pacing Threshold Pulse Width 0.5 ms   Battery Remaining Longevity 62 mo   Brady Statistic RA Percent Paced 0.05 %   Brady Statistic RV Percent Paced 98.0 %   Eval Rhythm AS/VS 64   PSA, total and free     Status: None   Collection Time: 11/01/24  1:28 PM  Result Value Ref Range   Prostate Specific Ag, Serum <0.1 0.0 - 4.0 ng/mL    Comment: Roche ECLIA methodology. According to the American Urological Association, Serum PSA should decrease and remain at undetectable levels after radical prostatectomy. The AUA defines biochemical recurrence as an initial PSA value 0.2 ng/mL or greater followed by a subsequent confirmatory PSA value 0.2 ng/mL or greater. Values obtained with different assay methods or kits cannot be used interchangeably. Results cannot be interpreted as absolute evidence of the presence or absence of malignant disease.    PSA, Free <0.02 N/A ng/mL    Comment: Roche ECLIA methodology.   PSA, Free Pct CANCELED %    Comment: Unable to calculate result since non-numeric result obtained for component test. The table below lists the probability of prostate cancer for men with non-suspicious DRE results and total PSA between 4 and 10 ng/mL, by patient age Jaycee rosemarie cherry, JAMA 1998, 720:8457).                   % Free PSA       50-64 yr        65-75 yr                   0.00-10.00%        56%             55%                  10.01-15.00%        24%             35%  15.01-20.00%         17%             23%                  20.01-25.00%        10%             20%                       >25.00%         5%              9% Please note:  Catalona et al did not make specific               recommendations regarding the use of               percent free PSA for any other population               of men.  Result canceled by the ancillary.    UA is clear.   I have reviewed his recent ER visit notes.   I reviewed prior notes  PSA, pathology reviewed  CT images independently reviewed   "

## 2024-11-12 ENCOUNTER — Telehealth: Payer: Self-pay

## 2024-11-12 NOTE — Telephone Encounter (Signed)
-----   Message from Belvie Clara, MD sent at 11/02/2024  8:24 AM EST ----- yes ----- Message ----- From: Gretta Carlos SAUNDERS, CMA Sent: 10/29/2024   1:02 PM EST To: Belvie LITTIE Clara, MD  Is it okay to refill?

## 2024-11-12 NOTE — Telephone Encounter (Signed)
 Patient has picked Rx from pharmacy

## 2024-11-24 ENCOUNTER — Ambulatory Visit: Admitting: Surgery

## 2025-01-04 ENCOUNTER — Ambulatory Visit

## 2025-04-05 ENCOUNTER — Ambulatory Visit

## 2025-05-19 ENCOUNTER — Other Ambulatory Visit

## 2025-05-25 ENCOUNTER — Ambulatory Visit: Admitting: Urology

## 2025-07-05 ENCOUNTER — Ambulatory Visit

## 2025-10-04 ENCOUNTER — Ambulatory Visit
